# Patient Record
Sex: Male | Born: 1955 | Race: White | Hispanic: No | Smoking: Current every day smoker
Health system: Southern US, Community
[De-identification: ages and names within clinical notes are randomized; demographics above are authoritative.]

## PROBLEM LIST (undated history)

## (undated) DIAGNOSIS — Z7901 Long term (current) use of anticoagulants: Secondary | ICD-10-CM

## (undated) DIAGNOSIS — Z720 Tobacco use: Secondary | ICD-10-CM

## (undated) DIAGNOSIS — J449 Chronic obstructive pulmonary disease, unspecified: Secondary | ICD-10-CM

## (undated) DIAGNOSIS — K838 Other specified diseases of biliary tract: Secondary | ICD-10-CM

## (undated) DIAGNOSIS — K851 Biliary acute pancreatitis without necrosis or infection: Secondary | ICD-10-CM

## (undated) DIAGNOSIS — B1920 Unspecified viral hepatitis C without hepatic coma: Secondary | ICD-10-CM

## (undated) DIAGNOSIS — Z91199 Patient's noncompliance with other medical treatment and regimen due to unspecified reason: Secondary | ICD-10-CM

## (undated) DIAGNOSIS — R9389 Abnormal findings on diagnostic imaging of other specified body structures: Secondary | ICD-10-CM

## (undated) DIAGNOSIS — I10 Essential (primary) hypertension: Secondary | ICD-10-CM

## (undated) DIAGNOSIS — M1990 Unspecified osteoarthritis, unspecified site: Secondary | ICD-10-CM

## (undated) DIAGNOSIS — W57XXXA Bitten or stung by nonvenomous insect and other nonvenomous arthropods, initial encounter: Secondary | ICD-10-CM

## (undated) DIAGNOSIS — Z8719 Personal history of other diseases of the digestive system: Secondary | ICD-10-CM

## (undated) DIAGNOSIS — I82409 Acute embolism and thrombosis of unspecified deep veins of unspecified lower extremity: Secondary | ICD-10-CM

## (undated) DIAGNOSIS — K219 Gastro-esophageal reflux disease without esophagitis: Secondary | ICD-10-CM

## (undated) DIAGNOSIS — I2699 Other pulmonary embolism without acute cor pulmonale: Secondary | ICD-10-CM

## (undated) DIAGNOSIS — Z9119 Patient's noncompliance with other medical treatment and regimen: Secondary | ICD-10-CM

## (undated) HISTORY — DX: Essential (primary) hypertension: I10

## (undated) HISTORY — DX: Patient's noncompliance with other medical treatment and regimen due to unspecified reason: Z91.199

## (undated) HISTORY — PX: INGUINAL HERNIA REPAIR: SUR1180

## (undated) HISTORY — PX: ANKLE SURGERY: SHX546

## (undated) HISTORY — DX: Other pulmonary embolism without acute cor pulmonale: I26.99

## (undated) HISTORY — DX: Long term (current) use of anticoagulants: Z79.01

## (undated) HISTORY — DX: Abnormal findings on diagnostic imaging of other specified body structures: R93.89

## (undated) HISTORY — DX: Acute embolism and thrombosis of unspecified deep veins of unspecified lower extremity: I82.409

## (undated) HISTORY — PX: FOOT SURGERY: SHX648

## (undated) HISTORY — DX: Chronic obstructive pulmonary disease, unspecified: J44.9

## (undated) HISTORY — DX: Unspecified viral hepatitis C without hepatic coma: B19.20

## (undated) HISTORY — DX: Biliary acute pancreatitis without necrosis or infection: K85.10

## (undated) HISTORY — DX: Tobacco use: Z72.0

## (undated) HISTORY — DX: Bitten or stung by nonvenomous insect and other nonvenomous arthropods, initial encounter: W57.XXXA

## (undated) HISTORY — DX: Patient's noncompliance with other medical treatment and regimen: Z91.19

## (undated) HISTORY — DX: Other specified diseases of biliary tract: K83.8

---
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
## 1996-02-21 HISTORY — PX: ANKLE SURGERY: SHX546

## 2011-02-27 ENCOUNTER — Encounter: Payer: Self-pay | Admitting: Cardiology

## 2011-02-28 ENCOUNTER — Encounter: Payer: Self-pay | Admitting: Cardiology

## 2011-03-06 ENCOUNTER — Encounter: Payer: Self-pay | Admitting: Cardiology

## 2011-03-10 ENCOUNTER — Telehealth: Payer: Self-pay

## 2011-03-10 ENCOUNTER — Encounter: Payer: Self-pay | Admitting: Cardiology

## 2011-03-10 LAB — PROTIME-INR

## 2011-03-10 NOTE — Telephone Encounter (Signed)
Records Received from [facility] gave to Gesila,Pt Needs to Make Np appt ? 03/10/11/KM

## 2011-03-13 ENCOUNTER — Telehealth: Payer: Self-pay | Admitting: Pharmacist

## 2011-03-13 NOTE — Telephone Encounter (Signed)
Received INR result from [facility].  Reviewed pt's medical records from [place].  Recently had PE and was started on Coumadin.  Due to establish care with Dr. Jens Som on 1/23.  He had his INR checked on 1/16 and was told to hold Coumadin x 2 days by MD in [place]. He was taking 3mg  daily at that point.  He was instructed to restart Coumadin with 5mg  daily (?) on Firday 1/18 and have INR rechecked and sent to our office.  INR on 1/18 was 1.5.  I asked pt to continue 5mg  daily and we will set him up for the Coumadin clinic on 1/23 when he sees Dr. Jens Som.

## 2011-03-15 ENCOUNTER — Ambulatory Visit (INDEPENDENT_AMBULATORY_CARE_PROVIDER_SITE_OTHER): Payer: BC Managed Care – PPO | Admitting: *Deleted

## 2011-03-15 ENCOUNTER — Encounter: Payer: Self-pay | Admitting: Cardiology

## 2011-03-15 ENCOUNTER — Ambulatory Visit (INDEPENDENT_AMBULATORY_CARE_PROVIDER_SITE_OTHER): Payer: BC Managed Care – PPO | Admitting: Cardiology

## 2011-03-15 VITALS — BP 148/72 | HR 78 | Ht 73.5 in | Wt 186.0 lb

## 2011-03-15 DIAGNOSIS — I2699 Other pulmonary embolism without acute cor pulmonale: Secondary | ICD-10-CM

## 2011-03-15 DIAGNOSIS — Z7901 Long term (current) use of anticoagulants: Secondary | ICD-10-CM | POA: Insufficient documentation

## 2011-03-15 DIAGNOSIS — Z720 Tobacco use: Secondary | ICD-10-CM

## 2011-03-15 DIAGNOSIS — I82409 Acute embolism and thrombosis of unspecified deep veins of unspecified lower extremity: Secondary | ICD-10-CM | POA: Insufficient documentation

## 2011-03-15 DIAGNOSIS — F17200 Nicotine dependence, unspecified, uncomplicated: Secondary | ICD-10-CM

## 2011-03-15 LAB — POCT INR: INR: 1.3

## 2011-03-15 NOTE — Assessment & Plan Note (Signed)
Patient counseled on discontinuing. 

## 2011-03-15 NOTE — Patient Instructions (Signed)
REFERRAL TO PULMONARY FOR PE

## 2011-03-15 NOTE — Assessment & Plan Note (Signed)
Plan continue Coumadin with goal INR 2-3. He will establish in the Coumadin clinic today. 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 He will need Coumadin for at least one year and potentially indefinitely if he continues in his present job. He has no cardiac problems. I will arrange a pulmonary evaluation to follow this long-term.

## 2011-03-15 NOTE — Patient Instructions (Signed)
A full discussion of the nature of anticoagulants has been carried out.  A benefit risk analysis has been presented to the patient, so that they understand the justification for choosing anticoagulation at this time. The need for frequent and regular monitoring, precise dosage adjustment and compliance is stressed.  Side effects of potential bleeding are discussed.  The patient should avoid any OTC items containing aspirin or ibuprofen, and should avoid great swings in general diet.  Avoid alcohol consumption.  Call if any signs of abnormal bleeding.  Next PT/INRin one week.  

## 2011-03-15 NOTE — Progress Notes (Signed)
  HPI: 56 year old male for evaluation of pulmonary embolus. Patient is a long distance truck driver and apparently seen in [place] recently with chest pain and shortness of breath. Left heart catheterization revealed minor plaquing but no obstructive coronary disease. The ejection fraction was 55%. Left ventricular end-diastolic pressure was 9. Patient's d-dimer was elevated and apparently a chest CT showed pulmonary embolus. He has been treated with Coumadin. He now presents to establish care. He does have dyspnea on exertion but denies orthopnea, PND, pedal edema, palpitations, syncope or chest pain.  Current Outpatient Prescriptions  Medication Sig Dispense Refill  . warfarin (COUMADIN) 3 MG tablet Take by mouth. As directed by the coumadin clinic        No Known Allergies  Past Medical History  Diagnosis Date  . Pulmonary embolus     Past Surgical History  Procedure Date  . Foot surgery     History   Social History  . Marital Status: Married    Spouse Name: N/A    Number of Children: N/A  . Years of Education: N/A   Occupational History  .      Long distance truck driver   Social History Main Topics  . Smoking status: Former Smoker -- 1.0 packs/day for 43 years    Types: Cigarettes    Quit date: 02/27/2011  . Smokeless tobacco: Never Used  . Alcohol Use: Yes     Occasional  . Drug Use: Not on file  . Sexually Active: Not on file   Other Topics Concern  . Not on file   Social History Narrative  . No narrative on file    Family History  Problem Relation Age of Onset  . Heart disease Mother     CHF  . Coronary artery disease Sister   . Coronary artery disease Sister     ROS: no fevers or chills, productive cough, hemoptysis, dysphasia, odynophagia, melena, hematochezia, dysuria, hematuria, rash, seizure activity, orthopnea, PND, pedal edema, claudication. Remaining systems are negative.  Physical Exam:   Blood pressure 148/72, pulse 78, height 6' 1.5"  (1.867 m), weight 186 lb (84.369 kg).  General:  Well developed/well nourished in NAD Skin warm/dry Patient not depressed No peripheral clubbing Back-normal HEENT-normal/normal eyelids Neck supple/normal carotid upstroke bilaterally; no bruits; no JVD; no thyromegaly chest - CTA/ normal expansion CV - RRR/normal S1 and S2; no murmurs, rubs or gallops;  PMI nondisplaced Abdomen -NT/ND, no HSM, no mass, + bowel sounds, no bruit 2+ femoral pulses, no bruits Ext-no edema, chords, 2+ DP Neuro-grossly nonfocal

## 2011-03-16 ENCOUNTER — Ambulatory Visit (INDEPENDENT_AMBULATORY_CARE_PROVIDER_SITE_OTHER): Payer: BC Managed Care – PPO | Admitting: Internal Medicine

## 2011-03-16 ENCOUNTER — Ambulatory Visit: Payer: BC Managed Care – PPO | Admitting: Internal Medicine

## 2011-03-16 ENCOUNTER — Encounter: Payer: Self-pay | Admitting: Internal Medicine

## 2011-03-16 VITALS — BP 154/80 | HR 74 | Temp 98.2°F | Ht 73.0 in | Wt 191.4 lb

## 2011-03-16 DIAGNOSIS — I82409 Acute embolism and thrombosis of unspecified deep veins of unspecified lower extremity: Secondary | ICD-10-CM

## 2011-03-16 DIAGNOSIS — I2699 Other pulmonary embolism without acute cor pulmonale: Secondary | ICD-10-CM

## 2011-03-16 NOTE — Patient Instructions (Signed)
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

## 2011-03-16 NOTE — Assessment & Plan Note (Addendum)
-   Onset 02/27/11 with L DVT [facility]   - Echo ordered 03/16/2011 >>>  Presyncope and persistent doe worrisome for [facility] but too early to rec anything but therapeutic coumadin. Will get baseline echo to check RV function and also pft's since was a smoker to follow serial dlco

## 2011-03-16 NOTE — Progress Notes (Signed)
  Subjective:    Patient ID: Justin Suarez, male    DOB: 06/26/1955, 56 y.o.   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   03/16/2011 1st pulmonary eval cc abupt onset 02/27/11  presyncope and sob p days before noting L calf tight and admitted same day to [facility] hosp x 9 days dx with PE/ L DVT > much better at discharge x 75% but still invariably sob across the parking lot, no leg symptoms.  No cough. No bleeding  Sleeping ok without nocturnal  or early am exacerbation  of respiratory  c/o's . Also denies any obvious fluctuation of symptoms with weather or environmental changes or other aggravating or alleviating factors except as outlined above.    Review of Systems  Constitutional: Negative for fever, chills, activity change, appetite change and unexpected weight change.  HENT: Negative for congestion, sore throat, rhinorrhea, sneezing, trouble swallowing, dental problem, voice change and postnasal drip.   Eyes: Negative for visual disturbance.  Respiratory: Positive for shortness of breath. Negative for cough and choking.   Cardiovascular: Negative for chest pain and leg swelling.  Gastrointestinal: Negative for nausea, vomiting and abdominal pain.  Genitourinary: Negative for difficulty urinating.  Musculoskeletal: Negative for arthralgias.  Skin: Negative for rash.  Psychiatric/Behavioral: Negative for behavioral problems and confusion.       Objective:   Physical Exam  Thin amb wm nad Wt 191 03/16/2011  Edentulous with dentures  HEENT mild turbinate edema.  Oropharynx no thrush or excess pnd or cobblestoning.  No JVD or cervical adenopathy. Mild accessory muscle hypertrophy. Trachea midline, nl thryroid. Chest was hyperinflated by percussion with diminished breath sounds and moderate increased exp time without wheeze. Hoover  sign positive at mid inspiration. Regular rate and rhythm without murmur gallop or rub or  Significant increase P2 or edema.  Abd: no hsm, nl excursion. Ext warm without cyanosis or clubbing.         Assessment & Plan:

## 2011-03-16 NOTE — Assessment & Plan Note (Signed)
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

## 2011-03-22 ENCOUNTER — Ambulatory Visit (INDEPENDENT_AMBULATORY_CARE_PROVIDER_SITE_OTHER): Payer: BC Managed Care – PPO | Admitting: *Deleted

## 2011-03-22 ENCOUNTER — Ambulatory Visit (HOSPITAL_COMMUNITY): Payer: BC Managed Care – PPO | Attending: Internal Medicine | Admitting: Radiology

## 2011-03-22 DIAGNOSIS — Z7901 Long term (current) use of anticoagulants: Secondary | ICD-10-CM

## 2011-03-22 DIAGNOSIS — I82409 Acute embolism and thrombosis of unspecified deep veins of unspecified lower extremity: Secondary | ICD-10-CM

## 2011-03-22 DIAGNOSIS — I2699 Other pulmonary embolism without acute cor pulmonale: Secondary | ICD-10-CM | POA: Insufficient documentation

## 2011-03-22 MED ORDER — WARFARIN SODIUM 6 MG PO TABS: ORAL_TABLET | ORAL | Status: DC

## 2011-03-23 ENCOUNTER — Encounter: Payer: Self-pay | Admitting: Internal Medicine

## 2011-03-23 NOTE — Progress Notes (Signed)
Quick Note:  Spoke with pt and notified of results per Dr. Wert. Pt verbalized understanding and denied any questions.  ______ 

## 2011-03-29 ENCOUNTER — Encounter: Payer: BC Managed Care – PPO | Admitting: *Deleted

## 2011-03-31 ENCOUNTER — Ambulatory Visit (INDEPENDENT_AMBULATORY_CARE_PROVIDER_SITE_OTHER): Payer: BC Managed Care – PPO | Admitting: *Deleted

## 2011-03-31 DIAGNOSIS — Z7901 Long term (current) use of anticoagulants: Secondary | ICD-10-CM

## 2011-03-31 DIAGNOSIS — I2699 Other pulmonary embolism without acute cor pulmonale: Secondary | ICD-10-CM

## 2011-03-31 DIAGNOSIS — I82409 Acute embolism and thrombosis of unspecified deep veins of unspecified lower extremity: Secondary | ICD-10-CM

## 2011-03-31 LAB — POCT INR: INR: 2.3

## 2011-04-07 ENCOUNTER — Ambulatory Visit (INDEPENDENT_AMBULATORY_CARE_PROVIDER_SITE_OTHER): Payer: BC Managed Care – PPO | Admitting: *Deleted

## 2011-04-07 ENCOUNTER — Telehealth: Payer: Self-pay | Admitting: Cardiology

## 2011-04-07 DIAGNOSIS — I82409 Acute embolism and thrombosis of unspecified deep veins of unspecified lower extremity: Secondary | ICD-10-CM

## 2011-04-07 DIAGNOSIS — Z7901 Long term (current) use of anticoagulants: Secondary | ICD-10-CM

## 2011-04-07 DIAGNOSIS — R0989 Other specified symptoms and signs involving the circulatory and respiratory systems: Secondary | ICD-10-CM

## 2011-04-07 DIAGNOSIS — I2699 Other pulmonary embolism without acute cor pulmonale: Secondary | ICD-10-CM

## 2011-04-07 LAB — POCT INR: INR: 6.3

## 2011-04-07 LAB — PROTIME-INR: Prothrombin Time: 52.1 seconds — ABNORMAL HIGH (ref 11.6–15.2)

## 2011-04-07 NOTE — Telephone Encounter (Signed)
Received a call from [facility] to report a critical lab value of INR 5.58. Patient is on coumadin for recent diagnosis of PE, with INR goal 2-3. He was at the coumadin clinic this morning and had PT/INR drawn. I called the patient and he denied any signs of active bleeding. He already took his 6mg  dose of coumadin this morning. After discussions with Theodore Demark, PA-C, I instructed him to hold his coumadin dose tomorrow (Saturday), take 3mg  on Sunday and Monday and await a phone call from the coumadin clinic for further instructions. I also instructed him to go to the ER should he develop active bleeding. He stated understanding and was agreeable. I then called the coumadin clinic and left a message.  Jeffry Vogelsang  PA-C 04/07/2011   9:08 PM

## 2011-04-07 NOTE — Telephone Encounter (Signed)
Opened in error

## 2011-04-13 ENCOUNTER — Ambulatory Visit (INDEPENDENT_AMBULATORY_CARE_PROVIDER_SITE_OTHER): Payer: BC Managed Care – PPO | Admitting: Internal Medicine

## 2011-04-13 ENCOUNTER — Encounter: Payer: Self-pay | Admitting: Internal Medicine

## 2011-04-13 ENCOUNTER — Ambulatory Visit (INDEPENDENT_AMBULATORY_CARE_PROVIDER_SITE_OTHER): Payer: BC Managed Care – PPO | Admitting: *Deleted

## 2011-04-13 VITALS — BP 120/78 | HR 78 | Temp 98.3°F | Ht 73.0 in | Wt 188.0 lb

## 2011-04-13 DIAGNOSIS — Z7901 Long term (current) use of anticoagulants: Secondary | ICD-10-CM

## 2011-04-13 DIAGNOSIS — I2699 Other pulmonary embolism without acute cor pulmonale: Secondary | ICD-10-CM

## 2011-04-13 DIAGNOSIS — J449 Chronic obstructive pulmonary disease, unspecified: Secondary | ICD-10-CM | POA: Insufficient documentation

## 2011-04-13 DIAGNOSIS — I82409 Acute embolism and thrombosis of unspecified deep veins of unspecified lower extremity: Secondary | ICD-10-CM

## 2011-04-13 LAB — PULMONARY FUNCTION TEST

## 2011-04-13 LAB — POCT INR: INR: 2.2

## 2011-04-13 NOTE — Patient Instructions (Addendum)
If your leg doppler is normal at 6 months (July 2013 ) I recommend you stop the coumadin and take aspirin daily thereafter.  To be complete, you a need hypercoagulability profile once you stop the coumadin.  You don't have significant copd by your lung function tests today and never will unless you resume smoking

## 2011-04-13 NOTE — Assessment & Plan Note (Signed)
-   04/13/2011  PFT's  2.89 (79%) with ratio 68 no better p B2,  DLCO 70%  GOLD II (barely) I reviewed the Flethcher curve with patient that basically indicates  if you quit smoking when your best day FEV1 is still well preserved it is highly unlikely you will progress to severe disease and informed the patient there was no medication on the market that has proven to change the curve or the likelihood of progression.  Therefore stopping smoking and maintaining abstinence is the most important aspect of care, not choice of inhalers or for that matter, doctors.

## 2011-04-13 NOTE — Progress Notes (Signed)
PFT done today. 

## 2011-04-13 NOTE — Progress Notes (Signed)
  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   03/16/2011 1st pulmonary eval cc abupt onset 02/27/11  presyncope and sob p days before noting L calf tight and admitted same day to [facility] hosp x 9 days dx with PE/ L DVT > much better at discharge x 75% but still invariably sob across the parking lot, no leg symptoms.  No cough. No bleeding rec Please see patient coordinator before you leave today  to schedule echocardiogram to be sure your right heart function will allow you to drive cross country   05/29/8117 f/u ov/Thelda Gagan cc completely back to baseline activity tol (no aerobics) with no sob, no cough  Sleeping ok without nocturnal  or early am exacerbation  of respiratory  c/o's . Also denies any obvious fluctuation of symptoms with weather or environmental changes or other aggravating or alleviating factors except as outlined above.  ROS  At present neg for  any significant sore throat, dysphagia, itching, sneezing,  nasal congestion or excess/ purulent secretions,  fever, chills, sweats, unintended wt loss, pleuritic or exertional cp, hempoptysis, orthopnea pnd or leg swelling.  Also denies presyncope, palpitations, heartburn, abdominal pain, nausea, vomiting, diarrhea  or change in bowel or urinary habits, dysuria,hematuria,  rash, arthralgias, visual complaints, headache, numbness weakness or ataxia.              Objective:   Physical Exam  Thin amb wm nad Wt 191 03/16/2011 > 04/13/2011 f/u ov/Javon Hupfer cc  Edentulous with dentures  HEENT mild turbinate edema.  Oropharynx no thrush or excess pnd or cobblestoning.  No JVD or cervical adenopathy. Mild accessory muscle hypertrophy. Trachea midline, nl thryroid. Chest was hyperinflated by percussion with diminished breath  sounds and moderate increased exp time without wheeze. Hoover sign positive at mid inspiration. Regular rate and rhythm without murmur gallop or rub or  Significant increase P2 or edema.  Abd: no hsm, nl excursion. Ext warm without cyanosis or clubbing.         Assessment & Plan:

## 2011-04-15 NOTE — Assessment & Plan Note (Signed)
-   Onset 02/27/11 with L DVT [facility]   - Echo 03/22/11 >  Normal LV size and systolic function, EF 55-60%. Moderate diastolic dysfunction. The RV appears normal in size and systolic function. No significant valvular abnormality.  No evidence of sign PAH or diffusion abnormatlity per pft's done today.  Ok to consider d/c coumadin if venous dopplers nl in 6 months but off coumadin x 2 weeks need to have hypercoagulability profile run, take aspirin and promise to change his work / driving habits.

## 2011-04-18 ENCOUNTER — Encounter: Payer: Self-pay | Admitting: Internal Medicine

## 2011-04-21 ENCOUNTER — Ambulatory Visit (INDEPENDENT_AMBULATORY_CARE_PROVIDER_SITE_OTHER): Payer: BC Managed Care – PPO | Admitting: *Deleted

## 2011-04-21 DIAGNOSIS — I2699 Other pulmonary embolism without acute cor pulmonale: Secondary | ICD-10-CM

## 2011-04-21 DIAGNOSIS — I82409 Acute embolism and thrombosis of unspecified deep veins of unspecified lower extremity: Secondary | ICD-10-CM

## 2011-04-21 DIAGNOSIS — Z7901 Long term (current) use of anticoagulants: Secondary | ICD-10-CM

## 2011-05-05 ENCOUNTER — Ambulatory Visit (INDEPENDENT_AMBULATORY_CARE_PROVIDER_SITE_OTHER): Payer: BC Managed Care – PPO | Admitting: Pharmacist

## 2011-05-05 DIAGNOSIS — Z7901 Long term (current) use of anticoagulants: Secondary | ICD-10-CM

## 2011-05-05 DIAGNOSIS — I2699 Other pulmonary embolism without acute cor pulmonale: Secondary | ICD-10-CM

## 2011-05-05 DIAGNOSIS — I82409 Acute embolism and thrombosis of unspecified deep veins of unspecified lower extremity: Secondary | ICD-10-CM

## 2011-05-05 LAB — POCT INR: INR: 1.4

## 2011-05-22 ENCOUNTER — Ambulatory Visit (INDEPENDENT_AMBULATORY_CARE_PROVIDER_SITE_OTHER): Payer: BC Managed Care – PPO | Admitting: *Deleted

## 2011-05-22 DIAGNOSIS — I2699 Other pulmonary embolism without acute cor pulmonale: Secondary | ICD-10-CM

## 2011-05-22 DIAGNOSIS — I82409 Acute embolism and thrombosis of unspecified deep veins of unspecified lower extremity: Secondary | ICD-10-CM

## 2011-05-22 DIAGNOSIS — Z7901 Long term (current) use of anticoagulants: Secondary | ICD-10-CM

## 2011-05-22 LAB — POCT INR: INR: 2.2

## 2011-06-19 ENCOUNTER — Ambulatory Visit (INDEPENDENT_AMBULATORY_CARE_PROVIDER_SITE_OTHER): Payer: BC Managed Care – PPO | Admitting: *Deleted

## 2011-06-19 DIAGNOSIS — I2699 Other pulmonary embolism without acute cor pulmonale: Secondary | ICD-10-CM

## 2011-06-19 DIAGNOSIS — I82409 Acute embolism and thrombosis of unspecified deep veins of unspecified lower extremity: Secondary | ICD-10-CM

## 2011-06-19 DIAGNOSIS — Z7901 Long term (current) use of anticoagulants: Secondary | ICD-10-CM

## 2011-06-21 DIAGNOSIS — R9389 Abnormal findings on diagnostic imaging of other specified body structures: Secondary | ICD-10-CM

## 2011-06-21 HISTORY — DX: Abnormal findings on diagnostic imaging of other specified body structures: R93.89

## 2011-07-19 ENCOUNTER — Emergency Department (HOSPITAL_COMMUNITY)
Admission: EM | Admit: 2011-07-19 | Discharge: 2011-07-19 | Disposition: A | Discharge status: Home / Self Care | Payer: BC Managed Care – PPO | Attending: Emergency Medicine | Admitting: Emergency Medicine

## 2011-07-19 ENCOUNTER — Encounter (HOSPITAL_COMMUNITY): Payer: Self-pay

## 2011-07-19 ENCOUNTER — Emergency Department (HOSPITAL_COMMUNITY): Payer: BC Managed Care – PPO

## 2011-07-19 DIAGNOSIS — R072 Precordial pain: Secondary | ICD-10-CM | POA: Insufficient documentation

## 2011-07-19 DIAGNOSIS — R079 Chest pain, unspecified: Secondary | ICD-10-CM

## 2011-07-19 DIAGNOSIS — R209 Unspecified disturbances of skin sensation: Secondary | ICD-10-CM | POA: Insufficient documentation

## 2011-07-19 DIAGNOSIS — R112 Nausea with vomiting, unspecified: Secondary | ICD-10-CM | POA: Insufficient documentation

## 2011-07-19 DIAGNOSIS — R200 Anesthesia of skin: Secondary | ICD-10-CM

## 2011-07-19 DIAGNOSIS — R61 Generalized hyperhidrosis: Secondary | ICD-10-CM | POA: Insufficient documentation

## 2011-07-19 DIAGNOSIS — Z86711 Personal history of pulmonary embolism: Secondary | ICD-10-CM | POA: Insufficient documentation

## 2011-07-19 DIAGNOSIS — R42 Dizziness and giddiness: Secondary | ICD-10-CM | POA: Insufficient documentation

## 2011-07-19 DIAGNOSIS — F17200 Nicotine dependence, unspecified, uncomplicated: Secondary | ICD-10-CM | POA: Insufficient documentation

## 2011-07-19 LAB — BASIC METABOLIC PANEL
Chloride: 97 mEq/L (ref 96–112)
GFR calc Af Amer: >90 mL/min (ref >90)
GFR calc non Af Amer: >90 mL/min (ref >90)
Potassium: 3.7 mEq/L (ref 3.5–5.1)
Sodium: 135 mEq/L (ref 135–145)

## 2011-07-19 LAB — PROTIME-INR: Prothrombin Time: 12.6 seconds (ref 11.6–15.2)

## 2011-07-19 LAB — DIFFERENTIAL
Basophils Absolute: 0 10*3/uL (ref 0.0–0.1)
Basophils Relative: 0 % (ref 0–1)
Eosinophils Absolute: 0 10*3/uL (ref 0.0–0.7)
Neutro Abs: 4.2 10*3/uL (ref 1.7–7.7)
Neutrophils Relative %: 70 % (ref 43–77)

## 2011-07-19 LAB — CBC
MCH: 33 pg (ref 26.0–34.0)
MCHC: 34.6 g/dL (ref 30.0–36.0)
Platelets: 250 10*3/uL (ref 150–400)

## 2011-07-19 LAB — TROPONIN I: Troponin I: <0.3 ng/mL (ref <0.30)

## 2011-07-19 LAB — D-DIMER, QUANTITATIVE: D-Dimer, Quant: 0.56 ug/mL-FEU — ABNORMAL HIGH (ref 0.00–0.48)

## 2011-07-19 MED ORDER — ASPIRIN 81 MG PO CHEW
324.0000 mg | CHEWABLE_TABLET | Freq: Once | ORAL | Status: AC
  Administered 2011-07-19: 324 mg via ORAL
  Filled 2011-07-19: qty 4

## 2011-07-19 MED ORDER — IOHEXOL 300 MG/ML  SOLN
100.0000 mL | Freq: Once | INTRAMUSCULAR | Status: AC | PRN
  Administered 2011-07-19: 100 mL via INTRAVENOUS

## 2011-07-19 MED ORDER — ONDANSETRON HCL 4 MG/2ML IJ SOLN
4.0000 mg | Freq: Once | INTRAMUSCULAR | Status: AC
  Administered 2011-07-19: 4 mg via INTRAVENOUS
  Filled 2011-07-19: qty 2

## 2011-07-19 MED ORDER — IOHEXOL 300 MG/ML  SOLN: 100.0000 mL | Freq: Once | INTRAMUSCULAR | Status: DC | PRN

## 2011-07-19 NOTE — ED Provider Notes (Signed)
History     CSN: [id number]  Arrival date & time 07/19/11  4098   First MD Initiated Contact with Patient 07/19/11 1004      Chief Complaint  Patient presents with  . Chest Pain  . 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  Pain was associated with lightheadedness, numbness of his left arm, and diaphoresis.  He reports that he took two aspirin and then fell asleep.  He woke up 5 hours later and no longer had the chest pain.  However, when he woke up he was feeling nauseous and vomited once last evening.  He also vomited again this morning.  At this time he is feeling nauseous, having chills, and continues to have numbness of the left arm.  He denies any weakness.  He denies any chest pain or SOB at this time.  He has a PMH significant for PE and COPD. PE diagnosed in [place] in January 2013.   He was on daily coumadin, but stopped 10 days ago because he was having a bleeding hemorrhoid.  His Cardiologist is Dr. Jens Som and he reports that Dr. Jens Som is aware that he has stopped the Coumadin.  He also reports that in January he had a cardiac cath done in [place] that he reports was negative.  He also had an Echocardiogram done on 03/22/11, which showed an EF of 55%.    The history is provided by the patient.    Past Medical History  Diagnosis Date  . Pulmonary embolus   . Embolism - blood clot     Past Surgical History  Procedure Date  . Foot surgery   . Ankle surgery     Family History  Problem Relation Age of Onset  . Heart disease Mother     CHF  . Coronary artery disease Sister   . Coronary artery disease Sister     History  Substance Use Topics  . Smoking status: Current Everyday Smoker -- 1.0 packs/day for 43 years    Types: Cigars    Last Attempt to Quit: 02/27/2011  . Smokeless tobacco: Never Used    . Alcohol Use: Yes     Occasional. 4-5 beers 12 oz./week.       Review of Systems  Constitutional: Negative for fever and chills.  HENT: Negative for neck pain and neck stiffness.   Eyes: Negative for visual disturbance.  Respiratory: Negative for cough, shortness of breath and wheezing.   Cardiovascular: Positive for chest pain. Negative for palpitations and leg swelling.  Gastrointestinal: Positive for nausea and vomiting. Negative for abdominal pain.  Musculoskeletal: Negative for gait problem.  Skin: Negative for color change and rash.  Neurological: Positive for dizziness, light-headedness and numbness. Negative for syncope, weakness and headaches.  Psychiatric/Behavioral: Negative for confusion.    Allergies  Review of patient's allergies indicates no known allergies.  Home Medications  No current outpatient prescriptions on file.  BP 161/84  Pulse 65  Temp(Src) 98.4 F (36.9 C) (Oral)  Resp 21  Wt 190 lb 2 oz (86.24 kg)  SpO2 97%  Physical Exam  Nursing note and vitals reviewed. Constitutional: He appears well-developed and well-nourished. No distress.  HENT:  Head: Normocephalic and atraumatic.  Mouth/Throat: Oropharynx is clear and moist.  Neck: Normal range of motion. Neck supple.  Cardiovascular: Normal rate, regular rhythm and normal heart sounds.   Pulmonary/Chest: Effort normal and breath sounds  normal. No respiratory distress. He has no wheezes. He has no rales. He exhibits no tenderness.  Neurological: He is alert. He has normal strength. No cranial nerve deficit or sensory deficit.  Skin: Skin is warm and dry. He is not diaphoretic.  Psychiatric: He has a normal mood and affect.    ED Course  Procedures (including critical care time)  Labs Reviewed  BASIC METABOLIC PANEL - Abnormal; Notable for the following:    Glucose, Bld 234 (*)    All other components within normal limits  D-DIMER, QUANTITATIVE - Abnormal; Notable for the following:     D-Dimer, Quant 0.56 (*)    All other components within normal limits  CBC  DIFFERENTIAL  TROPONIN I  PROTIME-INR  TROPONIN I  LAB REPORT - SCANNED   Dg Chest 2 View  07/19/2011  *RADIOLOGY REPORT*  Clinical Data: Left-sided chest pain with left arm numbness.  CHEST - 2 VIEW  Comparison: None.  Findings: Trachea is midline.  Heart size normal.  Lungs are hyperinflated but clear.  No pleural fluid.  IMPRESSION: No acute findings.  Original Report Authenticated By: Reyes Ivan, M.D.   Ct Angio Chest W/cm &/or Wo Cm  07/19/2011  *RADIOLOGY REPORT*  Clinical Data: Chest pain, weakness, diaphoresis and elevated D- dimer.  History of pulmonary emboli.  Question PE.  CT ANGIOGRAPHY CHEST  Technique:  Multidetector CT imaging of the chest using the standard protocol during bolus administration of intravenous contrast. Multiplanar reconstructed images including MIPs were obtained and reviewed to evaluate the vascular anatomy.  Contrast: OMNIPAQUE IOHEXOL 300 MG/ML  SOLN  Comparison: Chest radiograph 07/19/2011.  Findings: No pulmonary embolus.  Mediastinal and hilar lymph nodes are not enlarged by CT size criteria.  No axillary adenopathy. Bilateral gynecomastia.  Heart size normal.  Coronary artery calcification appears age advanced.  No pericardial effusion.  Scattered small pulmonary nodules measure up to 8 mm (10 x 6 mm) on image 72 in the left lower lobe.  Minimal dependent atelectasis bilaterally.  Mild expiratory phase imaging creates added density in the lungs bilaterally.  No pleural fluid.  Airway is otherwise unremarkable.  Incidental imaging of the upper abdomen shows no acute findings. No worrisome lytic or sclerotic lesions.  IMPRESSION:  1.  No pulmonary embolus. 2.  Coronary artery calcification, age advanced. 3.  Scattered pulmonary nodules, measuring up to 8 mm. If the patient is at high risk for bronchogenic carcinoma, follow-up chest CT at 3-6 months is recommended.  If the patient  is at low risk for bronchogenic carcinoma, follow-up chest CT at 6-12 months is recommended.  This recommendation follows the consensus statement: Guidelines for Management of Small Pulmonary Nodules Detected on CT Scans: A Statement from the Fleischner Society as published in Radiology 2005; 237:395-400.  Original Report Authenticated By: Reyes Ivan, M.D.     1. Chest pain   2. Numbness and tingling in left arm      Date: 07/19/2011  Rate: 72  Rhythm: normal sinus rhythm  QRS Axis: normal  Intervals: normal  ST/T Wave abnormalities: normal  Conduction Disutrbances:none  Narrative Interpretation:   Old EKG Reviewed: unchanged  Patient discussed with Dr. Ranae Palms who also evaluated patient.    2:38 PM Discussed with Annabelle Harman from [facility].  She reports that someone will come by and see patient.  3:30 PM Discussed with Cardiology who has evaluated patient.  They recommend ordering another set of cardiac markers.  If negative, they feel that patient  can be discharged home from a Cardiology standpoint.    MDM  Patient with PMH significant for PE and COPD comes in today with a chief complaint of chest pain and left arm numbness.  No acute changes on EKG.  Troponin negative x 2.  CTA ordered due to past history of PE. CTA negative for PE.  CTA did show pulmonary nodules.   Results and recommendations discussed with patient.  Patient evaluated by Cardiology who did not feel that the symptoms were cardiac.  Patient could have a pinched nerve causing the symptoms.  Patient instructed to follow up with PCP.  Return precautions discussed.        Pascal Lux [place], PA-C 07/20/11 [phone]

## 2011-07-19 NOTE — ED Notes (Signed)
Cardiology MD at bedside.

## 2011-07-19 NOTE — ED Notes (Signed)
Pt c/o returned chest pain to EDPA. Repeat EKG performed and given to Herbert Seta, EDPA and Ranae Palms, EDP.

## 2011-07-19 NOTE — Discharge Instructions (Signed)
* No food or drink after midnight 07/25/11 night in preparation for your stress test on 07/26/11.  * No caffeine on 07/26/11 in preparation for your stress test on 07/26/11.  * Wear comfortable shoes on 07/26/11 as you will be walking on a treadmill    Read instructions below for reasons to return to the Emergency Department. It is recommended that your follow up with your Primary Care Doctor in regards to today's visit. If you do not have a doctor, use the resource guide listed below to help you find one. Begin taking over the counter Prilosec or Zegrid as directed.   Chest Pain (Nonspecific)  HOME CARE INSTRUCTIONS  For the next few days, avoid physical activities that bring on chest pain. Continue physical activities as directed.  Do not smoke cigarettes or drink alcohol until your symptoms are gone.  Only take over-the-counter or prescription medicine for pain, discomfort, or fever as directed by your caregiver.  Follow your caregiver's suggestions for further testing if your chest pain does not go away.  Keep any follow-up appointments you made. If you do not go to an appointment, you could develop lasting (chronic) problems with pain. If there is any problem keeping an appointment, you must call to reschedule.  SEEK MEDICAL CARE IF:  You think you are having problems from the medicine you are taking. Read your medicine instructions carefully.  Your chest pain does not go away, even after treatment.  You develop a rash with blisters on your chest.  SEEK IMMEDIATE MEDICAL CARE IF:  You have increased chest pain or pain that spreads to your arm, neck, jaw, back, or belly (abdomen).  You develop shortness of breath, an increasing cough, or you are coughing up blood.  You have severe back or abdominal pain, feel sick to your stomach (nauseous) or throw up (vomit).  You develop severe weakness, fainting, or chills.  You have an oral temperature above 102 F (38.9 C), not controlled by  medicine.   THIS IS AN EMERGENCY. Do not wait to see if the pain will go away. Get medical help at once. Call your local emergency services (911 in [place] Do not drive yourself to the hospital.   RESOURCE GUIDE  Dental Problems  Patients with Medicaid: [facility] [phone] [address]                                           [phone] [address] Phone:  [phone]                                                  Phone:  [phone]  If unable to pay or uninsured, contact:  [facility] or [facility] to become qualified for the adult dental clinic.  Chronic Pain Problems Contact [facility]  [phone] Patients need to be referred by their primary care doctor.  Insufficient Money for Medicine Contact [employer] Way:  call "211" or [facility] 9365229462.  No Primary Care Doctor Call [facility]  [phone] Other agencies that provide inexpensive medical care  [facility]  [phone]    [facility]  [phone]    [facility]  [phone]    [facility]  [phone]    [facility]  [phone]    [facility]  [phone]  [facility]  [phone] [facility]  [phone] [facility] Mental Health   [phone] (emergency services ([phone])  Substance Abuse Resources [facility] Services  2497135970 [facility] [phone] [facility] [phone] Floydene Flock [phone] Residential & Outpatient Substance Abuse Program  [phone]  Abuse/Neglect [facility] Child Abuse Hotline 7037710541 [facility] Child Abuse Hotline [phone] (After Hours)  Emergency [facility]ter [facility] Minist[facility] 340-104-7730  Maternity Homes [facility] at [facility] [place] [facility] [place] [phone] [facility] [phone]  MRSA Hotline #:    930-468-5861    [facility] Resources  [facility]     [employer]                          [facility] Dept. [address] [place]                       [address]      [address] [place] [address]  Blondell Reveal Phone:  [phone]                                   Phone:  [phone]                 Phone:  [phone]  [facility] Phone:  [phone]  [facility] Child Abuse Hotline [phone] (After Hours)

## 2011-07-19 NOTE — ED Notes (Signed)
Patient transported to X-ray 

## 2011-07-19 NOTE — ED Notes (Signed)
Pt signed authorization for release of medical information to obtain records from [facility] for cardiac cath performed Jan 2013. Authorization and request for records faxed to hospital. Signed authorization placed in pt's chart. Awaiting cardiology consult.

## 2011-07-19 NOTE — Consult Note (Signed)
Cardiology Consult Note   Patient ID: Justin Suarez MRN: [id number], DOB/AGE: 1955/04/28   Admit date: 07/19/2011 Date of Consult: 07/19/2011  Primary Physician: No primary provider on file. Primary Cardiologist: Olga Millers, MD  Pt. 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   Reason for consult: evaluation/management of chest pain  Problem List: Past Medical History  Diagnosis Date  . Pulmonary embolus   . Embolism - blood clot     Past Surgical History  Procedure Date  . Foot surgery   . Ankle surgery      Allergies: No Known Allergies  PAST CARDIAC HISTORY  Cardiac cath 01/13: diffuse plaquing, nonobstructive CAD, LVEF 55%, LVEDP 9  2D echo 01/13: LVEF 55-60%, moderate diastolic dysfunction (grade 2), no WMAs, mild LA dilatation, normal RV systolic function, unable to estimate PASP due to lack of TR doppler jet, IVC WNL  HPI:   The patient recently saw Dr. Jens Som to establish care in 01/13. 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 He was admitted to [facility]. Upon reviewing scanned documents and his prior office note, he had a borderline troponin level and significant cardiac risk factors which prompting cardiac catheterization. He underwent cardiac catheterization which per Dr. Ludwig Clarks assessment revealed minor plaquing, but no obstructive CAD. LVEF 55%. LVEDP of 9. D-dimer was found to be elevated. He was diagnosed with L DVT. CT angiogram of the chest revealed PE. He was started on Coumadin in [place], and established at the [facility]. It was noted that no further cardiac eval was warranted. Since initiating Coumadin, he has had some issues  with supratherapeutic INRs, and self-holding Coumadin due to bleeding hemorrhoids as outlined in previous notes. He followed up with Dr. Sherene Sires in pulmonology 01/13 as well. 2D echocardiogram was ordered to assess for PAH and RV function. PFTs were ordered given patient's prior tobacco use. There was no evidence of PAH. There was a mild degree of COPD noted.   Early yesterday morning, he was driving his truck to [place]. He experienced lightheadedness and pulled over to the side of the road. This was followed by sharp, "stabbing" substernal chest pain which radiated to his L arm with associated numbness. He proceeded to take two full-dose ASA, laid his head back and fell asleep. When he awoke, the pain was gone, but the arm numbness continued in his left 4th and 5th digits. He reports associated nausea and vomiting. He experienced lightheadedness again this morning, numbness and vomiting, prompting his admission to [facility] ED. At baseline, he is able to chop wood without incident. He does endorse subjective fever and chills. No diarrhea. No syncope, palpitations, orthopnea, PND, LE edema. No sick contacts. No other medications. He has never been told he has high cholesterol or high blood pressure. He does have a significant family history of CAD and tobacco abuse. Of note, he stopped Coumadin 10 days ago due to hemorrhoidal bleeding.   Upon ED arrival, D-dimer was elevated at 0.56. INR subtherapeutic at 0.92. CT-angio chest without PE, advanced coronary artery calcification, scattered pulmonary nodules measuring up to 8 mm. Follow-up CT recommended in 3-12 months based on risk. EKG revealed nonspecific ST elevation V3, V3 (~1 mm), follow-up  EKG at <1 mm. CXR unremarkable. BMET and CBC WNL. POC troponin-I WNL. Glucose elevated at 234.   Home Medications: Prior to Admission medications   Not on File    Inpatient Medications:     . aspirin  324 mg Oral Once  . ondansetron  4 mg Intravenous Once    (Not  in a hospital admission)  Family History  Problem Relation Age of Onset  . Heart disease Mother     CHF  . Coronary artery disease Sister   . Coronary artery disease Sister      History   Social History  . Marital Status: Legally Separated    Spouse Name: N/A    Number of Children: 0  . Years of Education: N/A   Occupational History  .      Long distance truck driver   Social History Main Topics  . Smoking status: Current Everyday Smoker -- 1.0 packs/day for 43 years    Types: Cigars    Last Attempt to Quit: 02/27/2011  . Smokeless tobacco: Never Used  . Alcohol Use: Yes     Occasional. 4-5 beers 12 oz./week.   . Drug Use: No  . Sexually Active: Not on file   Other Topics Concern  . Not on file   Social History Narrative   He works as a Naval architect. Lives in [place], [place].      Review of Systems: General: positive for chills, fever, negative for night sweats or weight changes.  Cardiovascular: positive for chest pain, lightheadedness, negative for dyspnea on exertion, edema, orthopnea, palpitations, paroxysmal nocturnal dyspnea or shortness of breath Dermatological:  negative for rash Respiratory: negative for cough or wheezing Urologic: negative for hematuria Abdominal: positive for nausea, vomiting, negative for diarrhea, bright red blood per rectum, melena, or hematemesis Neurologic: negative for visual changes, syncope, or dizziness All other systems reviewed and are otherwise negative except as noted above.  Physical Exam: Blood pressure 167/90, pulse 107, temperature 98.4 F (36.9 C), temperature source Oral, resp. rate 20, weight 86.24 kg (190 lb 2 oz), SpO2 98.00%.    General: Well developed, well nourished, in no acute distress. Head: Normocephalic, atraumatic, sclera non-icteric, no xanthomas, nares are without discharge.  Neck: Negative for carotid bruits. JVD not elevated. Lungs: Clear bilaterally to auscultation without wheezes, rales, or rhonchi.  Breathing is unlabored. Heart: RRR with S1 S2. No murmurs, rubs, or gallops appreciated. Abdomen: Soft, non-tender, non-distended with normoactive bowel sounds. No hepatomegaly. No rebound/guarding. No obvious abdominal masses. Msk:  Strength and tone appears normal for age Extremities: No clubbing, cyanosis or edema.  Distal pedal pulses are 2+ and equal bilaterally. Neuro: Alert and oriented X 3. Moves all extremities spontaneously..weakness in thumb/5th finger ring Left hand Psych:  Responds to questions appropriately with a normal affect. Skin warm and dry Back with out kyphosis or scolisosis  Labs: Recent Labs  [facility] 07/19/11 1015   WBC 5.9   HGB 15.1   HCT 43.6   MCV 95.2   PLT 250   Recent Labs  Basename 07/19/11 1015   DDIMER 0.56*    Lab 07/19/11 1015  NA 135  K 3.7  CL 97  CO2 24  BUN 8  CREATININE 0.76  CALCIUM 8.7  PROT --  BILITOT --  ALKPHOS --  ALT --  AST --  AMYLASE --  LIPASE --  GLUCOSE 234*   Recent Labs  Basename 07/19/11 1015   CKTOTAL --   CKMB --  CKMBINDEX --   TROPONINI <0.30   Radiology/Studies: Dg Chest 2 View  07/19/2011  *RADIOLOGY REPORT*  Clinical Data: Left-sided chest pain with left arm numbness.  CHEST - 2 VIEW  Comparison: None.  Findings: Trachea is midline.  Heart size normal.  Lungs are hyperinflated but clear.  No pleural fluid.  IMPRESSION: No acute findings.  Original Report Authenticated By: Reyes Ivan, M.D.   Ct Angio Chest W/cm &/or Wo Cm  07/19/2011  *RADIOLOGY REPORT*  Clinical Data: Chest pain, weakness, diaphoresis and elevated D- dimer.  History of pulmonary emboli.  Question PE.  CT ANGIOGRAPHY CHEST  Technique:  Multidetector CT imaging of the chest using the standard protocol during bolus administration of intravenous contrast. Multiplanar reconstructed images including MIPs were obtained and reviewed to evaluate the vascular anatomy.  Contrast: OMNIPAQUE IOHEXOL 300 MG/ML  SOLN  Comparison:  Chest radiograph 07/19/2011.  Findings: No pulmonary embolus.  Mediastinal and hilar lymph nodes are not enlarged by CT size criteria.  No axillary adenopathy. Bilateral gynecomastia.  Heart size normal.  Coronary artery calcification appears age advanced.  No pericardial effusion.  Scattered small pulmonary nodules measure up to 8 mm (10 x 6 mm) on image 72 in the left lower lobe.  Minimal dependent atelectasis bilaterally.  Mild expiratory phase imaging creates added density in the lungs bilaterally.  No pleural fluid.  Airway is otherwise unremarkable.  Incidental imaging of the upper abdomen shows no acute findings. No worrisome lytic or sclerotic lesions.  IMPRESSION:  1.  No pulmonary embolus. 2.  Coronary artery calcification, age advanced. 3.  Scattered pulmonary nodules, measuring up to 8 mm. If the patient is at high risk for bronchogenic carcinoma, follow-up chest CT at 3-6 months is recommended.  If the patient is at low risk for bronchogenic carcinoma, follow-up chest CT at 6-12 months is recommended.  This recommendation follows the consensus statement: Guidelines for Management of Small Pulmonary Nodules Detected on CT Scans: A Statement from the Fleischner Society as published in Radiology 2005; 237:395-400.  Original Report Authenticated By: Reyes Ivan, M.D.    EKG: NSR, 77 bpm, nonspecific ST changes (elevation ~ 1mm V2, V3) NSR,72 bpm, nonspecific ST changes (elevation <1 mm V2, V3)  ASSESSMENT AND PLAN:   1. Chest pain- atypical for cardiac source. He describes the pain as sharp, lasting a few minutes, and nonexertional. He can perform strenuous outdoor chores without experiencing chest pain or shortness of breath at baseline. Cardiac cath in January 2013 revealed nonobstructive CAD. EKG and cardiac markers today unremarkable. He does have significant cardiac risk factors, but given his recent cardiac cath, would be low-risk for ACS. He has stopped Coumadin for 10 days due to  hemorrhoidal bleeding. Diagnosed with PE in January, but CT today revealed no evidence of PE, however. No evidence of pneumonia.   - Would get second set of enzymes, if normal, will schedule outpatient exercise Myoview and 30-day event monitor given lightheadedness  - Would recommend follow-up with PCP to assess for evaluate and manage chronic issues  2. History of DVT/PE- diagnosed in 01/13 in [place], [place]. Started on Coumadin. Managed by [facility]. Has had difficulties with this given hemorrhoidal bleeding lately. Stopped for 10 days. INR subtherapeutic. As above, no evidence of PE today.   - Would continue Coumadin  - Management of hemorrhoids per PCP  3. COPD- mild, stable. Pulmonary nodules noted on CT this admission. Would follow-up in 3-6 months as noted.   4.  Tobacco abuse- 42 pack-year history. Still smokes cigars. Stressed quitting.       Signed, R. Hurman Horn, PA-C 07/19/2011, 2:28 PM 1) atypical CP 2) non obstructive CAD cath 1/13 3) Left lateral hand numbness 4) Hx of PE 2/2 DVT not on coumadin 2/2 hemmorhoidal bleeding 5) tranisent lightheadedness assoc w Nausea, flushing diaphoresis but without palpitations 6)cig abuse  Recs 1)Given the change in character of his CP and the "normalization" of his ECG i would use myoview to risk stratify. If 2nd Ez are normal this can be done as outpt, o/w inpt 2) 30 day event recorder.  Without syncope or near syncope, I dont think there is an indication to restrict driving 3) defer to ER eval of Left hand  Berton Mount MD

## 2011-07-19 NOTE — ED Notes (Signed)
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 Patient reports CP associated with N/V, diaphoresis, light headedness, numbness of left hand and arm.Patient states he took  2 aspirin and fell asleep and awoke 5 hours later. Today, patient has n/v, numbness left hand, weakness, and tremors. Patient denies CP at this time.

## 2011-07-21 NOTE — ED Provider Notes (Signed)
Medical screening examination/treatment/procedure(s) were conducted as a shared visit with non-physician practitioner(s) and myself.  I personally evaluated the patient during the encounter   Loren Racer, MD 07/21/11 [phone]

## 2011-07-22 DIAGNOSIS — W57XXXA Bitten or stung by nonvenomous insect and other nonvenomous arthropods, initial encounter: Secondary | ICD-10-CM

## 2011-07-22 HISTORY — DX: Bitten or stung by nonvenomous insect and other nonvenomous arthropods, initial encounter: W57.XXXA

## 2011-07-26 ENCOUNTER — Ambulatory Visit (HOSPITAL_COMMUNITY): Payer: BC Managed Care – PPO | Attending: Cardiovascular Disease | Admitting: Radiology

## 2011-07-26 VITALS — Ht 73.0 in | Wt 186.0 lb

## 2011-07-26 DIAGNOSIS — J449 Chronic obstructive pulmonary disease, unspecified: Secondary | ICD-10-CM | POA: Insufficient documentation

## 2011-07-26 DIAGNOSIS — R0602 Shortness of breath: Secondary | ICD-10-CM

## 2011-07-26 DIAGNOSIS — R0609 Other forms of dyspnea: Secondary | ICD-10-CM | POA: Insufficient documentation

## 2011-07-26 DIAGNOSIS — I2510 Atherosclerotic heart disease of native coronary artery without angina pectoris: Secondary | ICD-10-CM | POA: Insufficient documentation

## 2011-07-26 DIAGNOSIS — R5381 Other malaise: Secondary | ICD-10-CM | POA: Insufficient documentation

## 2011-07-26 DIAGNOSIS — R079 Chest pain, unspecified: Secondary | ICD-10-CM

## 2011-07-26 DIAGNOSIS — Z8249 Family history of ischemic heart disease and other diseases of the circulatory system: Secondary | ICD-10-CM | POA: Insufficient documentation

## 2011-07-26 DIAGNOSIS — Z87891 Personal history of nicotine dependence: Secondary | ICD-10-CM | POA: Insufficient documentation

## 2011-07-26 DIAGNOSIS — R0989 Other specified symptoms and signs involving the circulatory and respiratory systems: Secondary | ICD-10-CM | POA: Insufficient documentation

## 2011-07-26 DIAGNOSIS — J4489 Other specified chronic obstructive pulmonary disease: Secondary | ICD-10-CM | POA: Insufficient documentation

## 2011-07-26 MED ORDER — TECHNETIUM TC 99M TETROFOSMIN IV KIT
30.0000 | PACK | Freq: Once | INTRAVENOUS | Status: AC | PRN
  Administered 2011-07-26: 30 via INTRAVENOUS

## 2011-07-26 MED ORDER — TECHNETIUM TC 99M TETROFOSMIN IV KIT
10.0000 | PACK | Freq: Once | INTRAVENOUS | Status: AC | PRN
  Administered 2011-07-26: 10 via INTRAVENOUS

## 2011-07-26 NOTE — Progress Notes (Signed)
[facility] SITE 3 NUCLEAR MED [address] [place] [address] [phone]  Cardiology Nuclear Med Study  Justin Suarez is a 56 y.o. male     MRN : [id number]     DOB: 05-09-1955  Procedure Date: 07/26/2011  Nuclear Med Background Indication for Stress Test:  Evaluation for Ischemia and Post Hospital, 07/19/11: [facility] chest pain lt. Headed with (-) enzymes/EKG History:  COPD and 02/2011: PE, ECHO: EF: 55-60% --02/2011: Heart Cath: N/O Dz EF: 55% Done in [place], [place], 07/19/11: CT: Advanced coronary artery Calcification Cardiac Risk Factors: Family History - CAD and History of Smoking  Symptoms:  Chest Pain, DOE and Fatigue   Nuclear Pre-Procedure Caffeine/Decaff Intake:  None NPO After: 8:00pm   Lungs:  clear O2 Sat: 96% on room air. IV 0.9% NS with Angio Cath:  20g  IV Site: R Antecubital  IV Started by:  Milana Na, EMT-P  Chest Size (in):  44 Cup Size: n/a  Height: 6\' 1"  (1.854 m)  Weight:  186 lb (84.369 kg)  BMI:  Body mass index is 24.54 kg/(m^2). Tech Comments:  n/a    Nuclear Med Study 1 or 2 day study: 1 day  Stress Test Type:  Stress  Reading MD: Kristeen Miss, MD  Order Authorizing Provider:  G. Ladona Ridgel MD  Resting Radionuclide: Technetium 50m Tetrofosmin  Resting Radionuclide Dose: 11.0 mCi   Stress Radionuclide:  Technetium 24m Tetrofosmin  Stress Radionuclide Dose: 33.0 mCi           Stress Protocol Rest HR: 57 Stress HR: 141  Rest BP: 146/80 Stress BP: 203/82  Exercise Time (min): 10:15 METS: 12.10   Predicted Max HR: 164 bpm % Max HR: 85.98 bpm Rate Pressure Product: 21308   Dose of Adenosine (mg):  n/a Dose of Lexiscan: n/a mg  Dose of Atropine (mg): n/a Dose of Dobutamine: n/a mcg/kg/min (at max HR)  Stress Test Technologist: Milana Na, EMT-P  Nuclear Technologist:  Domenic Polite, CNMT     Rest Procedure:  Myocardial perfusion imaging was performed at rest 45 minutes following the intravenous administration of  Technetium 79m Tetrofosmin. Rest ECG: NSR - Normal EKG  Stress Procedure:  The patient performed treadmill exercise using a Bruce  Protocol for 10:15 minutes. The patient stopped due to fatigue and denied any chest pain.  There were no significant ST-T wave changes and a rare pvc with exercise.  Technetium 73m Tetrofosmin was injected at peak exercise and myocardial perfusion imaging was performed after a brief delay. Stress ECG: No significant change from baseline ECG  QPS Raw Data Images:  Normal; no motion artifact; normal heart/lung ratio. Stress Images:  Normal homogeneous uptake in all areas of the myocardium. Rest Images:  Normal homogeneous uptake in all areas of the myocardium. Subtraction (SDS):  Normal Transient Ischemic Dilatation (Normal <1.22):  1.04 Lung/Heart Ratio (Normal <0.45):  0.38  Quantitative Gated Spect Images QGS EDV:  133 ml QGS ESV:  59 ml  Impression Exercise Capacity:  Excellent exercise capacity. BP Response:  Normal blood pressure response. Clinical Symptoms:  No significant symptoms noted. ECG Impression:  No significant ST segment change suggestive of ischemia. Comparison with Prior Nuclear Study: No images to compare  Overall Impression:  Normal stress nuclear study.  There is no evidence of ischemia.  The LV function is normal.   LV Ejection Fraction: 56%.    Vesta Mixer, Montez Hageman., MD, [facility] 07/26/2011, 5:25 PM Office - [phone] Pager [phone]

## 2011-07-27 ENCOUNTER — Other Ambulatory Visit (HOSPITAL_COMMUNITY): Payer: Self-pay | Admitting: Radiology

## 2011-07-27 DIAGNOSIS — R0609 Other forms of dyspnea: Secondary | ICD-10-CM

## 2011-07-31 ENCOUNTER — Encounter: Payer: BC Managed Care – PPO | Admitting: Nurse Practitioner

## 2011-07-31 ENCOUNTER — Encounter: Payer: BC Managed Care – PPO | Admitting: Physician Assistant

## 2011-08-01 NOTE — ED Provider Notes (Signed)
Order(s) created erroneously. Erroneous order ID: [id number] Order moved by: DOHERTY-CARBONE, Adrien Dietzman M Order move date/time: 08/01/2011  4:45 PM Source Patient:    [id number] Source Contact: 07/27/2011 Destination Patient:    [id number] Destination Contact: 07/26/2011

## 2011-08-01 NOTE — ED Provider Notes (Signed)
Order(s) created erroneously. Erroneous order ID: [id number] Order moved by: Tanna Savoy Order move date/time: 08/01/2011  4:45 PM Source Patient:    [id number] Source Contact: 07/27/2011 Destination Patient:    [id number] Destination Contact: 07/26/2011

## 2011-08-07 ENCOUNTER — Encounter: Payer: Self-pay | Admitting: Nurse Practitioner

## 2011-08-07 ENCOUNTER — Ambulatory Visit (INDEPENDENT_AMBULATORY_CARE_PROVIDER_SITE_OTHER): Payer: BC Managed Care – PPO | Admitting: Nurse Practitioner

## 2011-08-07 VITALS — BP 150/78 | HR 80 | Ht 73.0 in | Wt 190.6 lb

## 2011-08-07 DIAGNOSIS — T148XXA Other injury of unspecified body region, initial encounter: Secondary | ICD-10-CM

## 2011-08-07 DIAGNOSIS — I2699 Other pulmonary embolism without acute cor pulmonale: Secondary | ICD-10-CM

## 2011-08-07 DIAGNOSIS — IMO0001 Reserved for inherently not codable concepts without codable children: Secondary | ICD-10-CM | POA: Insufficient documentation

## 2011-08-07 DIAGNOSIS — R079 Chest pain, unspecified: Secondary | ICD-10-CM | POA: Insufficient documentation

## 2011-08-07 DIAGNOSIS — S30861A Insect bite (nonvenomous) of abdominal wall, initial encounter: Secondary | ICD-10-CM | POA: Insufficient documentation

## 2011-08-07 DIAGNOSIS — W57XXXA Bitten or stung by nonvenomous insect and other nonvenomous arthropods, initial encounter: Secondary | ICD-10-CM

## 2011-08-07 DIAGNOSIS — S30860A Insect bite (nonvenomous) of lower back and pelvis, initial encounter: Secondary | ICD-10-CM

## 2011-08-07 DIAGNOSIS — R030 Elevated blood-pressure reading, without diagnosis of hypertension: Secondary | ICD-10-CM

## 2011-08-07 MED ORDER — DOXYCYCLINE HYCLATE 50 MG PO CAPS: 100.0000 mg | ORAL_CAPSULE | Freq: Two times a day (BID) | ORAL | Status: AC

## 2011-08-07 NOTE — Progress Notes (Signed)
[name] Date of Birth: 06-Feb-1956 Medical Record #[id number]  History of Present Illness: Justin Suarez is seen back today for a post hospital visit today. He is seen for Dr. Jens Som. He has had prior DVT/PE and has been on coumadin since January. 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 Has HTN and currently trying to get weaned off of caffeine but refuses medicines at this time. Was recently admitted with chest pain and had a negative hospital stay and negative Myoview. He does have an event monitor on for some lightheadedness and will be wearing this until the 25th. He has also had some issues with bleeding hemorrhoids. His blood sugar was quite elevated during that admission as well.   He comes in today. He says he is feeling better. Still smoking some. Not wanting to stop. No more chest pain. No more lightheadedness. Blood pressure is running about 150 at home. Not wanting any medicines. No more bleeding from his hemorrhoids and HAS NOT restarted his coumadin. Says it makes him feel bad and runs and his blood pressure up. He does not want to restart it.  He did have a CT with this recent admission that was negative for PE. This CT did show some lung nodules and needs to be repeated in 3 to 12 months. He has planned follow up with his PCP at the first of July. His big concern is that of a tick bite to his left side of his abdomen. It is quite swollen and red.   No current outpatient prescriptions on file prior to visit.    No Known Allergies  Past Medical History  Diagnosis Date  . Pulmonary embolus   . DVT (deep venous thrombosis)   . HTN (hypertension)   . Tobacco abuse   . Chronic anticoagulation   . Noncompliance   . Tick bite June 2013  . Abnormal CT scan, chest May 2013    multiple lung nodules; needs repeat study in 3 to 12 months.    Past Surgical History  Procedure Date  . Foot surgery   . Ankle surgery     History  Smoking status  . Current  Everyday Smoker -- 1.0 packs/day for 43 years  . Types: Cigars  Smokeless tobacco  . Never Used    History  Alcohol Use  . Yes    Occasional. 4-5 beers 12 oz./week.     Family History  Problem Relation Age of Onset  . Heart disease Mother     CHF  . Heart failure Mother   . Coronary artery disease Sister   . Heart disease Sister   . Coronary artery disease Sister   . Heart disease Sister   . Heart disease Father     Review of Systems: The review of systems is per the HPI.  All other systems were reviewed and are negative.  Physical Exam: BP 150/78  Pulse 80  Ht 6\' 1"  (1.854 m)  Wt 190 lb 9.6 oz (86.456 kg)  BMI 25.15 kg/m2 Patient is very pleasant and in no acute distress. Skin is warm and dry. Color is normal.  HEENT is unremarkable. Normocephalic/atraumatic. PERRL. Sclera are nonicteric. Neck is supple. No masses. No JVD. Lungs are fairly clear. Cardiac exam shows a regular rate and rhythm. Abdomen is soft. He has a baseball size hardness that is red, swollen and warm to touch. Extremities are without edema. Gait and ROM are intact. No gross neurologic deficits noted.   LABORATORY DATA:  Myoview Impression  Exercise Capacity: Excellent exercise capacity.  BP Response: Normal blood pressure response.  Clinical Symptoms: No significant symptoms noted.  ECG Impression: No significant ST segment change suggestive of ischemia.  Comparison with Prior Nuclear Study: No images to compare  Overall Impression: Normal stress nuclear study. There is no evidence of ischemia. The LV function is normal. LV Ejection Fraction: 56%.     Dg Chest 2 View  07/19/2011  *RADIOLOGY REPORT*  Clinical Data: Left-sided chest pain with left arm numbness.  CHEST - 2 VIEW  Comparison: None.  Findings: Trachea is midline.  Heart size normal.  Lungs are hyperinflated but clear.  No pleural fluid.  IMPRESSION: No acute findings.  Original Report Authenticated By: Reyes Ivan, M.D.   Ct  Angio Chest W/cm &/or Wo Cm  07/19/2011  *RADIOLOGY REPORT*  Clinical Data: Chest pain, weakness, diaphoresis and elevated D- dimer.  History of pulmonary emboli.  Question PE.  CT ANGIOGRAPHY CHEST  Technique:  Multidetector CT imaging of the chest using the standard protocol during bolus administration of intravenous contrast. Multiplanar reconstructed images including MIPs were obtained and reviewed to evaluate the vascular anatomy.  Contrast: OMNIPAQUE IOHEXOL 300 MG/ML  SOLN  Comparison: Chest radiograph 07/19/2011.  Findings: No pulmonary embolus.  Mediastinal and hilar lymph nodes are not enlarged by CT size criteria.  No axillary adenopathy. Bilateral gynecomastia.  Heart size normal.  Coronary artery calcification appears age advanced.  No pericardial effusion.  Scattered small pulmonary nodules measure up to 8 mm (10 x 6 mm) on image 72 in the left lower lobe.  Minimal dependent atelectasis bilaterally.  Mild expiratory phase imaging creates added density in the lungs bilaterally.  No pleural fluid.  Airway is otherwise unremarkable.  Incidental imaging of the upper abdomen shows no acute findings. No worrisome lytic or sclerotic lesions.  IMPRESSION:  1.  No pulmonary embolus. 2.  Coronary artery calcification, age advanced. 3.  Scattered pulmonary nodules, measuring up to 8 mm. If the patient is at high risk for bronchogenic carcinoma, follow-up chest CT at 3-6 months is recommended.  If the patient is at low risk for bronchogenic carcinoma, follow-up chest CT at 6-12 months is recommended.  This recommendation follows the consensus statement: Guidelines for Management of Small Pulmonary Nodules Detected on CT Scans: A Statement from the Fleischner Society as published in Radiology 2005; 237:395-400.  Original Report Authenticated By: Reyes Ivan, M.D.    Lab Results  Component Value Date   WBC 5.9 07/19/2011   HGB 15.1 07/19/2011   HCT 43.6 07/19/2011   PLT 250 07/19/2011   GLUCOSE  234* 07/19/2011   NA 135 07/19/2011   K 3.7 07/19/2011   CL 97 07/19/2011   CREATININE 0.76 07/19/2011   BUN 8 07/19/2011   CO2 24 07/19/2011   INR 0.92 07/19/2011      Assessment / Plan:

## 2011-08-07 NOTE — Assessment & Plan Note (Signed)
He has a significant size bite. I have given him a 10 day course of Doxycycline. He is to see his PCP if no improvement.

## 2011-08-07 NOTE — Assessment & Plan Note (Signed)
Currently off of his coumadin due to side effects. No further bleeding from his hemorrhoids. I explained that coumadin does not affect blood pressure. He is willing to restart. Will have him discuss further at his visit with Dr. Jens Som next month. May be a candidate for Xarelto. 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

## 2011-08-07 NOTE — Assessment & Plan Note (Signed)
Blood pressure is elevated here and at home. Not willing to start medicines yet. He has planned follow up with his PCP and I will have him see Dr. Jens Som in early July for discussion as well.

## 2011-08-07 NOTE — Assessment & Plan Note (Addendum)
He has had a negative Myoview. Smoking cessation has been encouraged. Has had a negative CT of the chest for PE but does have some nodules and will need a repeat scan in 3 to 12 months. Still has his event monitor in place. I will have him see Dr. Jens Som at the first part of July for further discussion. Patient is agreeable to this plan and will call if any problems develop in the interim.

## 2011-08-07 NOTE — Patient Instructions (Addendum)
I recommend you restart your Coumadin at your prior dose  Check a protime in one week  Continue to wear the heart monitor.  I will have you see Dr. Jens Som the first part of July. You can discuss with him how you are doing on restarting the coumadin and your monitor results  I am going to give you some antibiotics for your tick bite  Continue to monitor your blood pressure at home and keep a diary. Bring this in for review for Dr. Jens Som  Call the [facility] office at [phone] if you have any questions, problems or concerns.

## 2011-08-28 ENCOUNTER — Telehealth: Payer: Self-pay | Admitting: *Deleted

## 2011-08-28 NOTE — Telephone Encounter (Signed)
Left message for pt to call, monitor reviewed by dr crenshaw shows sinus 

## 2011-09-08 NOTE — Telephone Encounter (Signed)
LMTCB ./CY 

## 2011-09-22 ENCOUNTER — Encounter: Payer: BC Managed Care – PPO | Admitting: Cardiology

## 2011-09-22 NOTE — Progress Notes (Signed)
   HPI: Pleasant male for fu of chest pain. I initially saw the patient in Jan 2013. He had recently been seen in [place] for CP and dyspnea. Left heart catheterization revealed minor plaquing but no obstructive coronary disease. The ejection fraction was 55%. Left ventricular end-diastolic pressure was 9. Patient's d-dimer was elevated and apparently a chest CT showed pulmonary embolus. He has been treated with Coumadin. Echocardiogram in January of 2013 showed an ejection fraction of 55-60%, grade 2 diastolic dysfunction, mild left atrial enlargement. Patient seen in the emergency room in may with complaints of chest pain and light headed feeling. A CT showed no pulmonary embolus but there was coronary artery calcification. There is a pulmonary nodule and followup recommended in 3-12 months. Myoview in June of 2013 showed an ejection fraction of 56% and normal perfusion. Event monitor in July of 2013 showed sinus rhythm. Patient seen in [place] 6/13. Since then,    No current outpatient prescriptions on file.     Past Medical History  Diagnosis Date  . Pulmonary embolus   . DVT (deep venous thrombosis)   . HTN (hypertension)   . Tobacco abuse   . Chronic anticoagulation   . Noncompliance   . Tick bite June 2013  . Abnormal CT scan, chest May 2013    multiple lung nodules; needs repeat study in 3 to 12 months.    Past Surgical History  Procedure Date  . Foot surgery   . Ankle surgery     History   Social History  . Marital Status: Legally Separated    Spouse Name: N/A    Number of Children: 0  . Years of Education: N/A   Occupational History  .      Long distance truck driver   Social History Main Topics  . Smoking status: Current Everyday Smoker -- 1.0 packs/day for 43 years    Types: Cigars  . Smokeless tobacco: Never Used  . Alcohol Use: Yes     Occasional. 4-5 beers 12 oz./week.   . Drug Use: No  . Sexually Active: Not Currently   Other Topics Concern  . Not on file    Social History Narrative   He works as a Naval architect. Lives in [place], [place].     ROS: no fevers or chills, productive cough, hemoptysis, dysphasia, odynophagia, melena, hematochezia, dysuria, hematuria, rash, seizure activity, orthopnea, PND, pedal edema, claudication. Remaining systems are negative.  Physical Exam: Well-developed well-nourished in no acute distress.  Skin is warm and dry.  HEENT is normal.  Neck is supple. No thyromegaly.  Chest is clear to auscultation with normal expansion.  Cardiovascular exam is regular rate and rhythm.  Abdominal exam nontender or distended. No masses palpated. Extremities show no edema. neuro grossly intact  ECG     This encounter was created in error - please disregard.

## 2011-10-13 ENCOUNTER — Encounter: Payer: Self-pay | Admitting: *Deleted

## 2011-10-13 NOTE — Telephone Encounter (Signed)
Letter of results sent to pt  

## 2012-10-16 ENCOUNTER — Telehealth: Payer: Self-pay | Admitting: *Deleted

## 2012-10-16 NOTE — Telephone Encounter (Signed)
Called and left message to call clinic as he has not been seen since April 2013

## 2020-01-14 ENCOUNTER — Encounter: Payer: Self-pay | Admitting: *Deleted

## 2020-02-04 ENCOUNTER — Ambulatory Visit (INDEPENDENT_AMBULATORY_CARE_PROVIDER_SITE_OTHER)
Admission: RE | Admit: 2020-02-04 | Discharge: 2020-02-04 | Disposition: A | Discharge status: Home / Self Care | Payer: Medicare Other | Source: Ambulatory Visit | Attending: Internal Medicine | Admitting: Internal Medicine

## 2020-02-04 ENCOUNTER — Ambulatory Visit: Payer: Medicare Other | Admitting: Internal Medicine

## 2020-02-04 ENCOUNTER — Encounter: Payer: Self-pay | Admitting: Internal Medicine

## 2020-02-04 ENCOUNTER — Other Ambulatory Visit: Payer: Self-pay

## 2020-02-04 VITALS — BP 160/98 | HR 78 | Ht 73.0 in | Wt 180.0 lb

## 2020-02-04 DIAGNOSIS — K838 Other specified diseases of biliary tract: Secondary | ICD-10-CM | POA: Diagnosis not present

## 2020-02-04 DIAGNOSIS — R062 Wheezing: Secondary | ICD-10-CM

## 2020-02-04 DIAGNOSIS — F17200 Nicotine dependence, unspecified, uncomplicated: Secondary | ICD-10-CM | POA: Diagnosis not present

## 2020-02-04 DIAGNOSIS — R1011 Right upper quadrant pain: Secondary | ICD-10-CM | POA: Diagnosis not present

## 2020-02-04 NOTE — Progress Notes (Signed)
HISTORY OF PRESENT ILLNESS:  Justin Suarez is a 64 y.o. male, new to this practice, with a history of hypertension, COPD, and prior DVT.  On no medications.  Sent by his primary care provider Justin Suarez regarding right upper quadrant pain and dilated bile duct on imaging.  Patient states that he developed right upper quadrant pain approximately 3 months ago.  He describes 3 discrete episodes which were severe.  Radiation into the back.  Lasted several days.  This was associated with nausea and vomiting as well as decreased appetite.  No fevers.  No dark urine.  In between episodes he describes slight chronic discomfort.  His weight has been stable.  He is a chronic smoker.  He does have occasional reflux.  No dysphagia.  No prior history of colon cancer screening.  After requesting outside records blood work from November 18, 2019 reveals unremarkable comprehensive metabolic panel.  Normal liver tests.  Unremarkable CBC with hemoglobin 14.7.  White blood cell count 6.3.  Abdominal ultrasound performed November 20, 2019 revealed gallbladder sludge but no stones.  Bile duct was said to be prominent and measure 9 mm.  Also, possible hepatic steatosis.  No other abnormalities.  MRI recommended.  This was performed December 02, 2019.  The bile duct was said to be mildly enlarged at 8 mm.  The gallbladder was mildly distended.  The bile duct was said to taper at the ampulla but NO choledocholithiasis or obvious obstructing lesion.  There was also a 1.6 x 1.4 cm nonenhancing lesion of the inferior pancreatic head/uncinate process which appeared to abut the main pancreatic duct.  IPMN versus pseudocyst was the differential.  Repeat MRI in 1 year to assure stability was recommended.  Patient tells me that he is feeling fine at this point.  REVIEW OF SYSTEMS:  All non-GI ROS negative unless otherwise stated in the HPI except for arthritis, back pain, headaches, mild right extremity edema  Past Medical History:   Diagnosis Date  . Abnormal CT scan, chest May 2013   multiple lung nodules; needs repeat study in 3 to 12 months.  . Chronic anticoagulation   . Common bile duct dilation   . COPD (chronic obstructive pulmonary disease) ([place])   . DVT (deep venous thrombosis) ([place])   . HTN (hypertension)   . Noncompliance   . Pulmonary embolus ([place])   . Tick bite June 2013  . Tobacco abuse     Past Surgical History:  Procedure Laterality Date  . ANKLE SURGERY Right    x 1 from a fall  . FOOT SURGERY Right    x 3 from a fall  . INGUINAL HERNIA REPAIR Right     Social History Justin Suarez  reports that he has been smoking cigarettes. He has a 43.00 pack-year smoking history. He has never used smokeless tobacco. He reports current alcohol use. He reports that he does not use drugs.  family history includes Atrial fibrillation in his brother; Coronary artery disease in his sister and sister; Heart disease in his father, mother, sister, and sister; Heart failure in his mother.  No Known Allergies     PHYSICAL EXAMINATION: Vital signs: BP (!) 160/98   Pulse 78   Ht 6\' 1"  (1.854 m)   Wt 180 lb (81.6 kg)   BMI 23.75 kg/m   Constitutional: generally well-appearing, no acute distress Psychiatric: alert and oriented x3, cooperative Eyes: extraocular movements intact, anicteric, conjunctiva pink Mouth: oral pharynx moist, no lesions Neck: supple  no lymphadenopathy Cardiovascular: heart regular rate and rhythm, no murmur Lungs: Wheezing and decreased breath sounds right base.  Otherwise normal breath sounds Abdomen: soft, mild epigastric and right upper quadrant tenderness, nondistended, no obvious ascites, no peritoneal signs, normal bowel sounds, no organomegaly Rectal: Omitted Extremities: no clubbing, or cyanosis.  Trace right lower extremity edema. Skin: no lesions on visible extremities Neuro: No focal deficits. No asterixis.   ASSESSMENT:  1.  Recurrent right upper quadrant pain,  severe at times, with gallbladder sludge on imaging.  Rule out biliary colic. 2.  Mild bile duct dilation with no evidence for obstructing lesion or stones imaging. 3.  Normal liver tests. 4.  Indeterminate pancreatic lesion as described 5.  Abnormal breath sounds on physical exam today in a chronic smoker.  Rule out pleuritic base process of the right to explain symptoms and physical exam findings.   PLAN:  1.  PA lateral chest x-ray "rule out right lower lobe lesion" 2.  At this point (assuming he does not have right lung cancer) I would recommend endoscopic ultrasound to further interrogate the bile duct (and pancreas).  My algorithm, thereafter, as follows:  a) If choledocholithiasis is found, then proceed to same-day ERCP.  Post ERCP with bile duct clearance, refer to general surgery for laparoscopic cholecystectomy.  b) If no choledocholithiasis (or other lesion) on the EUS, then general surgical referral for laparoscopic cholecystectomy. 3.  I will forward this note to my EUS colleagues for review. 4.  Ongoing general medical care with Justin Suarez  A total time of 60 minutes was required preparing to see the patient, reviewing laboratory tests and x-rays, obtaining comprehensive history and performing comprehensive physical examination.  Counseling the patient regarding his above listed issues.  Ordering advanced endoscopic procedure and additional x-ray imaging.  Finally, documenting clinical information in the health record

## 2020-02-04 NOTE — Patient Instructions (Signed)
Please go to the x-ray department in the basement before leaving today.  Due to recent changes in healthcare laws, you may see the results of your imaging and laboratory studies on MyChart before your provider has had a chance to review them.  We understand that in some cases there may be results that are confusing or concerning to you. Not all laboratory results come back in the same time frame and the provider may be waiting for multiple results in order to interpret others.  Please give [place] 48 hours in order for your provider to thoroughly review all the results before contacting the office for clarification of your results.   We will be in touch about getting an EUS done by either Dr Owens Loffler or Dr Justice Britain.   I appreciate the opportunity to care for you. Scarlette Shorts, MD

## 2020-02-05 ENCOUNTER — Telehealth: Payer: Self-pay

## 2020-02-05 ENCOUNTER — Other Ambulatory Visit: Payer: Self-pay

## 2020-02-05 DIAGNOSIS — R109 Unspecified abdominal pain: Secondary | ICD-10-CM

## 2020-02-05 DIAGNOSIS — K838 Other specified diseases of biliary tract: Secondary | ICD-10-CM

## 2020-02-05 DIAGNOSIS — R948 Abnormal results of function studies of other organs and systems: Secondary | ICD-10-CM

## 2020-02-05 NOTE — Telephone Encounter (Signed)
[]  Hover for details  Justin Suarez, I completely agree with your plan.  [place], He needs EUS +/- ERCP with either myself or Gabe, first available appointment for a 'double apt'.  Diagnosis abd pain, abnormal bile duct and pancreas.  [name]

## 2020-02-05 NOTE — Telephone Encounter (Signed)
Patient is returning your call.  

## 2020-02-05 NOTE — Telephone Encounter (Signed)
EUS scheduled, pt instructed and medications reviewed.  Patient instructions mailed to home.  Patient to call with any questions or concerns.  

## 2020-02-05 NOTE — Telephone Encounter (Signed)
-----   Message from Milus Banister, MD sent at 02/05/2020  7:19 AM EST -----   ----- Message ----- From: Irene Shipper, MD Sent: 02/04/2020   4:13 PM EST To: Milus Banister, MD, #  [place], For consideration of EUS.  Thanks Jenny Reichmann

## 2020-02-05 NOTE — Progress Notes (Signed)
Justin Suarez, I completely agree with your plan.  Justin Suarez, He needs EUS +/- ERCP with either myself or Gabe, first available appointment for a 'double apt'.  Diagnosis abd pain, abnormal bile duct and pancreas.  [name]

## 2020-02-05 NOTE — Telephone Encounter (Signed)
EUS ERCP scheduled for 03/18/20 at 830 am at [facility] with Dr Ardis Hughs COVID test 03/15/20 at 1035 am

## 2020-03-15 ENCOUNTER — Other Ambulatory Visit (HOSPITAL_COMMUNITY)
Admission: RE | Admit: 2020-03-15 | Discharge: 2020-03-15 | Disposition: A | Discharge status: Home / Self Care | Payer: Medicare Other | Source: Ambulatory Visit | Attending: Gastroenterology | Admitting: Gastroenterology

## 2020-03-15 DIAGNOSIS — U071 COVID-19: Secondary | ICD-10-CM | POA: Diagnosis not present

## 2020-03-15 DIAGNOSIS — Z01812 Encounter for preprocedural laboratory examination: Secondary | ICD-10-CM | POA: Insufficient documentation

## 2020-03-15 LAB — SARS CORONAVIRUS 2 (TAT 6-24 HRS): SARS Coronavirus 2: POSITIVE — AB

## 2020-03-16 ENCOUNTER — Telehealth: Payer: Self-pay | Admitting: Nurse Practitioner

## 2020-03-16 ENCOUNTER — Telehealth: Payer: Self-pay | Admitting: Internal Medicine

## 2020-03-16 NOTE — Progress Notes (Signed)
Patient with positive covid result. Contacted MD and informed of result.   

## 2020-03-16 NOTE — Telephone Encounter (Signed)
Inbound call from [place] at the [facility] Pre-Procedure Site stating patient tested positive for covid.

## 2020-03-16 NOTE — Telephone Encounter (Signed)
Noted pt will be called see alternate note dated 1/25

## 2020-03-16 NOTE — Telephone Encounter (Signed)
Called to Discuss with patient about Covid symptoms and the use of the monoclonal antibody infusion for those with mild to moderate Covid symptoms and at a high risk of hospitalization.     Pt appears to qualify for this infusion due to co-morbid conditions and/or a member of an at-risk group in accordance with the FDA Emergency Use Authorization.    Unable to reach pt. Voicemail left.   Symptom onset: Will need to speak w/patient Vaccinated: none on file  Qualified for Infusion: Yes, if symptomatic and within guidelines.   Alda Lea, NP [facility]  [phone]

## 2020-03-17 ENCOUNTER — Encounter (HOSPITAL_COMMUNITY): Payer: Self-pay | Admitting: Gastroenterology

## 2020-04-01 ENCOUNTER — Ambulatory Visit (HOSPITAL_COMMUNITY): Payer: Medicare Other | Admitting: Certified Registered Nurse Anesthetist

## 2020-04-01 ENCOUNTER — Telehealth: Payer: Self-pay

## 2020-04-01 ENCOUNTER — Other Ambulatory Visit: Payer: Self-pay

## 2020-04-01 ENCOUNTER — Encounter (HOSPITAL_COMMUNITY): Payer: Self-pay | Admitting: Gastroenterology

## 2020-04-01 ENCOUNTER — Encounter (HOSPITAL_COMMUNITY)
Admission: RE | Disposition: A | Discharge status: Home / Self Care | Payer: Self-pay | Source: Home / Self Care | Attending: Gastroenterology

## 2020-04-01 ENCOUNTER — Ambulatory Visit (HOSPITAL_COMMUNITY)
Admission: RE | Admit: 2020-04-01 | Discharge: 2020-04-01 | Disposition: A | Discharge status: Home / Self Care | Payer: Medicare Other | Attending: Gastroenterology | Admitting: Gastroenterology

## 2020-04-01 DIAGNOSIS — R1011 Right upper quadrant pain: Secondary | ICD-10-CM | POA: Diagnosis not present

## 2020-04-01 DIAGNOSIS — Z7901 Long term (current) use of anticoagulants: Secondary | ICD-10-CM | POA: Diagnosis not present

## 2020-04-01 DIAGNOSIS — K862 Cyst of pancreas: Secondary | ICD-10-CM | POA: Diagnosis not present

## 2020-04-01 DIAGNOSIS — K861 Other chronic pancreatitis: Secondary | ICD-10-CM

## 2020-04-01 DIAGNOSIS — K838 Other specified diseases of biliary tract: Secondary | ICD-10-CM | POA: Diagnosis not present

## 2020-04-01 DIAGNOSIS — Z86718 Personal history of other venous thrombosis and embolism: Secondary | ICD-10-CM | POA: Diagnosis not present

## 2020-04-01 DIAGNOSIS — Z8249 Family history of ischemic heart disease and other diseases of the circulatory system: Secondary | ICD-10-CM | POA: Insufficient documentation

## 2020-04-01 DIAGNOSIS — J449 Chronic obstructive pulmonary disease, unspecified: Secondary | ICD-10-CM | POA: Insufficient documentation

## 2020-04-01 DIAGNOSIS — R110 Nausea: Secondary | ICD-10-CM | POA: Diagnosis not present

## 2020-04-01 DIAGNOSIS — Z86711 Personal history of pulmonary embolism: Secondary | ICD-10-CM | POA: Insufficient documentation

## 2020-04-01 DIAGNOSIS — R109 Unspecified abdominal pain: Secondary | ICD-10-CM

## 2020-04-01 DIAGNOSIS — F17210 Nicotine dependence, cigarettes, uncomplicated: Secondary | ICD-10-CM | POA: Diagnosis not present

## 2020-04-01 DIAGNOSIS — R948 Abnormal results of function studies of other organs and systems: Secondary | ICD-10-CM

## 2020-04-01 HISTORY — PX: ESOPHAGOGASTRODUODENOSCOPY (EGD) WITH PROPOFOL: SHX5813

## 2020-04-01 HISTORY — PX: EUS: SHX5427

## 2020-04-01 SURGERY — UPPER ENDOSCOPIC ULTRASOUND (EUS) RADIAL
Anesthesia: Monitor Anesthesia Care

## 2020-04-01 MED ORDER — PROPOFOL 500 MG/50ML IV EMUL
INTRAVENOUS | Status: AC
  Filled 2020-04-01: qty 150

## 2020-04-01 MED ORDER — GLUCAGON HCL RDNA (DIAGNOSTIC) 1 MG IJ SOLR
INTRAMUSCULAR | Status: AC
  Filled 2020-04-01: qty 1

## 2020-04-01 MED ORDER — LACTATED RINGERS IV SOLN: INTRAVENOUS | Status: DC | PRN

## 2020-04-01 MED ORDER — INDOMETHACIN 50 MG RE SUPP
RECTAL | Status: AC
  Filled 2020-04-01: qty 2

## 2020-04-01 MED ORDER — PROPOFOL 500 MG/50ML IV EMUL
INTRAVENOUS | Status: DC | PRN
Start: 2020-04-01 — End: 2020-04-01
  Administered 2020-04-01: 100 ug/kg/min via INTRAVENOUS

## 2020-04-01 MED ORDER — SODIUM CHLORIDE 0.9 % IV SOLN: INTRAVENOUS | Status: DC

## 2020-04-01 MED ORDER — LIDOCAINE 2% (20 MG/ML) 5 ML SYRINGE
INTRAMUSCULAR | Status: DC | PRN
  Administered 2020-04-01: 80 mg via INTRAVENOUS

## 2020-04-01 MED ORDER — PROPOFOL 10 MG/ML IV BOLUS
INTRAVENOUS | Status: DC | PRN
  Administered 2020-04-01 (×3): 30 mg via INTRAVENOUS

## 2020-04-01 MED ORDER — LACTATED RINGERS IV SOLN: Freq: Once | INTRAVENOUS | Status: AC

## 2020-04-01 NOTE — Discharge Instructions (Signed)
YOU HAD AN ENDOSCOPIC PROCEDURE TODAY: Refer to the procedure report and other information in the discharge instructions given to you for any specific questions about what was found during the examination. If this information does not answer your questions, please call [place] office at [phone] to clarify.  ° °YOU SHOULD EXPECT: Some feelings of bloating in the abdomen. Passage of more gas than usual. Walking can help get rid of the air that was put into your GI tract during the procedure and reduce the bloating. If you had a lower endoscopy (such as a colonoscopy or flexible sigmoidoscopy) you may notice spotting of blood in your stool or on the toilet paper. Some abdominal soreness may be present for a day or two, also. ° °DIET: Your first meal following the procedure should be a light meal and then it is ok to progress to your normal diet. A half-sandwich or bowl of soup is an example of a good first meal. Heavy or fried foods are harder to digest and may make you feel nauseous or bloated. Drink plenty of fluids but you should avoid alcoholic beverages for 24 hours. If you had a esophageal dilation, please see attached instructions for diet.   ° °ACTIVITY: Your care partner should take you home directly after the procedure. You should plan to take it easy, moving slowly for the rest of the day. You can resume normal activity the day after the procedure however YOU SHOULD NOT DRIVE, use power tools, machinery or perform tasks that involve climbing or major physical exertion for 24 hours (because of the sedation medicines used during the test).  ° °SYMPTOMS TO REPORT IMMEDIATELY: °A gastroenterologist can be reached at any hour. Please call [phone]  for any of the following symptoms:  °Following lower endoscopy (colonoscopy, flexible sigmoidoscopy) °Excessive amounts of blood in the stool  °Significant tenderness, worsening of abdominal pains  °Swelling of the abdomen that is new, acute  °Fever of 100° or  higher  °Following upper endoscopy (EGD, EUS, ERCP, esophageal dilation) °Vomiting of blood or coffee ground material  °New, significant abdominal pain  °New, significant chest pain or pain under the shoulder blades  °Painful or persistently difficult swallowing  °New shortness of breath  °Black, tarry-looking or red, bloody stools ° °FOLLOW UP:  °If any biopsies were taken you will be contacted by phone or by letter within the next 1-3 weeks. Call [phone]  if you have not heard about the biopsies in 3 weeks.  °Please also call with any specific questions about appointments or follow up tests. ° °

## 2020-04-01 NOTE — Telephone Encounter (Signed)
Pt in for recall for MRI in 2 years. Referral faxed to [facility] for surgery.

## 2020-04-01 NOTE — Op Note (Addendum)
[facility] Patient Name: Justin Suarez Procedure Date: 04/01/2020 MRN: [id number] Attending MD: Milus Banister , MD Date of Birth: November 10, 1955 CSN: [id number] Age: 65 Admit Type: Outpatient Procedure:                Upper EUS Indications:              Four episodes of RUQ pains +nausea. Normal LFTs.                            Outside MRI suggested dilated bile duct,                            non-dilated main PD, non-enhancing cystic lesion in                            pancreas (inferior pancreatic head/uncinate).                            Previous alcoholic (drank 1 case of beer daily for                            many years), no FH of pancreatic disease, no weight                            loss. Providers:                Milus Banister, MD, Baird Cancer, RN, Lesia Sago, Technician Referring MD:             Scarlette Shorts, MD Medicines:                Monitored Anesthesia Care Complications:            No immediate complications. Estimated blood loss:                            None. Estimated Blood Loss:     Estimated blood loss: none. Procedure:                Pre-Anesthesia Assessment:                           - Prior to the procedure, a History and Physical                            was performed, and patient medications and                            allergies were reviewed. The patient's tolerance of                            previous anesthesia was also reviewed. The risks                            and benefits of the  procedure and the sedation                            options and risks were discussed with the patient.                            All questions were answered, and informed consent                            was obtained. Prior Anticoagulants: The patient has                            taken no previous anticoagulant or antiplatelet                            agents. ASA Grade Assessment: II - A patient  with                            mild systemic disease. After reviewing the risks                            and benefits, the patient was deemed in                            satisfactory condition to undergo the procedure.                           After obtaining informed consent, the endoscope was                            passed under direct vision. Throughout the                            procedure, the patient's blood pressure, pulse, and                            oxygen saturations were monitored continuously. The                            [id number] ([id number] Olympus Radial EUS was                            introduced through the mouth, and advanced to the                            second part of duodenum. The Olympus [id number]                            [id number]) was introduced through the mouth, and                            advanced to the second part of duodenum. The upper  EUS was accomplished without difficulty. The                            patient tolerated the procedure well. Scope In: Scope Out: Findings:      ENDOSCOPIC FINDING: :      The examined esophagus was endoscopically normal.      The entire examined stomach was endoscopically normal.      The examined duodenum was endoscopically normal including very good       visualization of normal major papilla with a duodenoscope.      ENDOSONOGRAPHIC FINDING: :      1. The pancreatic parenchyma was abnormal throughout the gland. It was       more hypoechoic than usual, lobular and seemed to contain small       shadowing calcifications. The main pancreatic duct was ectatic but not       dilated. There were no discrete solid masses.      2. There was a 1.5cm thinly septated cystic lesion in the uncinate       pancreas that was poorly visualized but there were no obvious associated       solid mass lesions.      3. CBD was slightly dilated into the head of pancreas (max diameter 15mm)        and it did not contain stones.      4. Gallbladder was thick walled (2-59mm) and did not contain any stones.      5. Limited views of the liver, spleen, portal and splenic vessels were       all normal Impression:               - Findings consistent with chronic pancreatitis,                            probably etoh related given his former heavy                            alcohol abuse (a case of beer daily for many years,                            much less lately). The changes of chronic                            pancreatitis derease the ability of imaging tests                            (including this EUS) to detect neoplastic growths.                           - Cystic lesion in uncinate pancreas without                            concerning morophologic features on this exam or                            recent MRI. This is probably a pseudocyst, I  recommend repeat MRI in 2 years to ensure stability.                           - No gallstones or CBD stones but his gallbladder                            is thickwalled. His symptoms are fairly classic for                            biliary colic. Moderate Sedation:      Not Applicable - Patient had care per Anesthesia. Recommendation:           - Discharge patient to home.                           - I will communicate these findings with Dr. Henrene Pastor. Procedure Code(s):        --- Professional ---                           [phone], Esophagogastroduodenoscopy, flexible,                            transoral; with endoscopic ultrasound examination                            limited to the esophagus, stomach or duodenum, and                            adjacent structures Diagnosis Code(s):        --- Professional ---                           K86.1, Other chronic pancreatitis                           K83.8, Other specified diseases of biliary tract CPT copyright 2019 [employer]. All  rights reserved. The codes documented in this report are preliminary and upon coder review may  be revised to meet current compliance requirements. Milus Banister, MD 04/01/2020 11:25:59 AM This report has been signed electronically. Number of Addenda: 0

## 2020-04-01 NOTE — Anesthesia Preprocedure Evaluation (Signed)
Anesthesia Evaluation  Patient identified by MRN, date of birth, ID band Patient awake    Reviewed: Allergy & Precautions, H&P , NPO status , Patient's Chart, lab work & pertinent test results, reviewed documented beta blocker date and time   Airway Mallampati: II  TM Distance: >3 FB Neck ROM: full    Dental no notable dental hx.    Pulmonary COPD, Current Smoker,    Pulmonary exam normal breath sounds clear to auscultation       Cardiovascular Exercise Tolerance: Good hypertension, Pt. on medications  Rhythm:regular Rate:Normal     Neuro/Psych negative neurological ROS  negative psych ROS   GI/Hepatic negative GI ROS, Neg liver ROS,   Endo/Other  negative endocrine ROS  Renal/GU negative Renal ROS  negative genitourinary   Musculoskeletal   Abdominal   Peds  Hematology negative hematology ROS (+)   Anesthesia Other Findings   Reproductive/Obstetrics negative OB ROS                             Anesthesia Physical Anesthesia Plan  ASA: III  Anesthesia Plan: MAC   Post-op Pain Management:    Induction: Intravenous  PONV Risk Score and Plan: 1  Airway Management Planned: Mask, Natural Airway and Nasal Cannula  Additional Equipment: None  Intra-op Plan:   Post-operative Plan:   Informed Consent: I have reviewed the patients History and Physical, chart, labs and discussed the procedure including the risks, benefits and alternatives for the proposed anesthesia with the patient or authorized representative who has indicated his/her understanding and acceptance.     Dental Advisory Given  Plan Discussed with: CRNA and Anesthesiologist  Anesthesia Plan Comments:         Anesthesia Quick Evaluation

## 2020-04-01 NOTE — H&P (Signed)
HPI: This is a man referred by Dr. Henrene Pastor  Outside MRI This was performed December 02, 2019.  The bile duct was said to be mildly enlarged at 8 mm.  The gallbladder was mildly distended.  The bile duct was said to taper at the ampulla but NO choledocholithiasis or obvious obstructing lesion.  There was also a 1.6 x 1.4 cm nonenhancing lesion of the inferior pancreatic head/uncinate process which appeared to abut the main pancreatic duct.  IPMN versus pseudocyst was the differential.   3 episodes of RUQ pain, normal LFTs (sept 2021)   ROS: complete GI ROS as described in HPI, all other review negative.  Constitutional:  No unintentional weight loss   Past Medical History:  Diagnosis Date  . Abnormal CT scan, chest May 2013   multiple lung nodules; needs repeat study in 3 to 12 months.  . Chronic anticoagulation   . Common bile duct dilation   . COPD (chronic obstructive pulmonary disease) ([place])   . DVT (deep venous thrombosis) ([place])   . HTN (hypertension)   . Noncompliance   . Pulmonary embolus ([place])   . Tick bite June 2013  . Tobacco abuse     Past Surgical History:  Procedure Laterality Date  . ANKLE SURGERY Right    x 1 from a fall  . FOOT SURGERY Right    x 3 from a fall  . INGUINAL HERNIA REPAIR Right     Current Facility-Administered Medications  Medication Dose Route Frequency Provider Last Rate Last Admin  . 0.9 %  sodium chloride infusion   Intravenous Continuous Milus Banister, MD        Allergies as of 02/05/2020  . (No Known Allergies)    Family History  Problem Relation Age of Onset  . Heart disease Mother        CHF  . Heart failure Mother   . Coronary artery disease Sister   . Heart disease Sister   . Coronary artery disease Sister   . Heart disease Sister   . Heart disease Father   . Atrial fibrillation Brother   . Esophageal cancer Neg Hx   . Colon cancer Neg Hx   . Rectal cancer Neg Hx     Social History   Socioeconomic History  .  Marital status: Legally Separated    Spouse name: Not on file  . Number of children: 0  . Years of education: Not on file  . Highest education level: Not on file  Occupational History  . Occupation: retired    Comment: Long distance Administrator  Tobacco Use  . Smoking status: Current Every Day Smoker    Packs/day: 1.00    Years: 43.00    Pack years: 43.00    Types: Cigarettes  . Smokeless tobacco: Never Used  Vaping Use  . Vaping Use: Never used  Substance and Sexual Activity  . Alcohol use: Yes    Comment: Occasional. 4-5 beers 12 oz./week.   . Drug use: No  . Sexual activity: Not Currently  Other Topics Concern  . Not on file  Social History Narrative   He works as a Administrator. Lives in [place], [place].    Social Determinants of Health   Financial Resource Strain: Not on file  Food Insecurity: Not on file  Transportation Needs: Not on file  Physical Activity: Not on file  Stress: Not on file  Social Connections: Not on file  Intimate Partner Violence: Not on file  Physical Exam: BP (!) 180/89   Pulse 74   Temp 98.2 F (36.8 C) (Oral)   Resp 11   Ht 6' (1.829 m)   Wt 82.6 kg   SpO2 100%   BMI 24.68 kg/m  Constitutional: generally well-appearing Psychiatric: alert and oriented x3 Abdomen: soft, nontender, nondistended, no obvious ascites, no peritoneal signs, normal bowel sounds No peripheral edema noted in lower extremities  Assessment and plan: 65 y.o. male with abnormal pancreas, bile ducts, normal LFTs, previous RUQ pains  For evaluation with EUS +/- ERCP  Please see the "Patient Instructions" section for addition details about the plan.  Owens Loffler, MD [facility] 04/01/2020, 10:07 AM

## 2020-04-01 NOTE — Anesthesia Postprocedure Evaluation (Signed)
Anesthesia Post Note  Patient: Justin Suarez  Procedure(s) Performed: UPPER ENDOSCOPIC ULTRASOUND (EUS) RADIAL (N/A ) ENDOSCOPIC RETROGRADE CHOLANGIOPANCREATOGRAPHY (ERCP) WITH PROPOFOL (N/A )     Patient location during evaluation: PACU Anesthesia Type: MAC Level of consciousness: awake and alert Pain management: pain level controlled Vital Signs Assessment: post-procedure vital signs reviewed and stable Respiratory status: spontaneous breathing, nonlabored ventilation, respiratory function stable and patient connected to nasal cannula oxygen Cardiovascular status: stable and blood pressure returned to baseline Postop Assessment: no apparent nausea or vomiting Anesthetic complications: no   No complications documented.  Last Vitals:  Vitals:   04/01/20 1120 04/01/20 1121  BP: (!) 173/77   Pulse:  75  Resp:  18  Temp:    SpO2:  100%    Last Pain:  Vitals:   04/01/20 1101  TempSrc: Oral  PainSc: 2                  Hawk Mones

## 2020-04-01 NOTE — Transfer of Care (Signed)
Immediate Anesthesia Transfer of Care Note  Patient: Justin Suarez  Procedure(s) Performed: UPPER ENDOSCOPIC ULTRASOUND (EUS) RADIAL (N/A ) ENDOSCOPIC RETROGRADE CHOLANGIOPANCREATOGRAPHY (ERCP) WITH PROPOFOL (N/A )  Patient Location: PACU and Endoscopy Unit  Anesthesia Type:MAC  Level of Consciousness: awake, alert  and patient cooperative  Airway & Oxygen Therapy: Patient Spontanous Breathing and Patient connected to face mask oxygen  Post-op Assessment: Report given to RN and Post -op Vital signs reviewed and stable  Post vital signs: Reviewed and stable  Last Vitals:  Vitals Value Taken Time  BP 138/77 04/01/20 1059  Temp    Pulse 89 04/01/20 1059  Resp 20 04/01/20 1059  SpO2 100 % 04/01/20 1059  Vitals shown include unvalidated device data.  Last Pain:  Vitals:   04/01/20 0820  TempSrc: Oral  PainSc: 2          Complications: No complications documented.

## 2020-04-01 NOTE — Anesthesia Procedure Notes (Addendum)
Procedure Name: MAC Date/Time: 04/01/2020 10:26 AM Performed by: West Pugh, CRNA Pre-anesthesia Checklist: Patient identified, Emergency Drugs available, Suction available, Patient being monitored and Timeout performed Patient Re-evaluated:Patient Re-evaluated prior to induction Oxygen Delivery Method: Simple face mask Preoxygenation: Pre-oxygenation with 100% oxygen Induction Type: IV induction Placement Confirmation: positive ETCO2 Dental Injury: Teeth and Oropharynx as per pre-operative assessment

## 2020-04-01 NOTE — Telephone Encounter (Signed)
-----   Message from Irene Shipper, MD sent at 04/01/2020 12:14 PM EST ----- Linna Hoff, Thank you! JP  Desmund Elman, 1.  Put in MRI of the pancreas recall for 2 years 2.  Please refer to [facility] surgery "biliary colic, for laparoscopic cholecystectomy" Thank you, JP  ----- Message ----- From: Milus Banister, MD Sent: 04/01/2020  11:44 AM EST To: Irene Shipper, MD  John, See full EUS report in Epic. His symptoms really seem like they are GB related and I think referral to [facility] to consider lap chole is a good idea, they may want a HIDA scan to boost the case for surgery.  Thanks     - Findings consistent with chronic pancreatitis, probably etoh related given his former heavy alcohol abuse (a case of beer daily for many years, much less lately).  The changes of chronic pancreatitis derease the ability of imaging tests (including this EUS) to detect neoplastic growths. - Cystic lesion in uncinate pancreas without concerning morophologic features on this exam or recent MRI. This is probably a pseudocyst, I recommend repeat MRI in 2 years to ensure stability. - No gallstones or CBD stones but his gallbladder is thickwalled.  His symptoms are fairly classic for biliary colic.

## 2020-04-02 ENCOUNTER — Encounter (HOSPITAL_COMMUNITY): Payer: Self-pay | Admitting: Gastroenterology

## 2020-04-04 LAB — POCT I-STAT, CHEM 8
BUN: 3 mg/dL — ABNORMAL LOW (ref 8–23)
Calcium, Ion: 1.11 mmol/L — ABNORMAL LOW (ref 1.15–1.40)
Chloride: 97 mmol/L — ABNORMAL LOW (ref 98–111)
Creatinine, Ser: 0.7 mg/dL (ref 0.61–1.24)
Glucose, Bld: 98 mg/dL (ref 70–99)
HCT: 45 % (ref 39.0–52.0)
Hemoglobin: 15.3 g/dL (ref 13.0–17.0)
Potassium: 4.5 mmol/L (ref 3.5–5.1)
Sodium: 134 mmol/L — ABNORMAL LOW (ref 135–145)
TCO2: 26 mmol/L (ref 22–32)

## 2020-04-21 ENCOUNTER — Ambulatory Visit: Payer: Self-pay | Admitting: General Surgery

## 2020-04-21 NOTE — H&P (Signed)
[name] Appointment: 04/21/2020 11:30 AM Location: [facility] Patient #: [id number] DOB: 03-Jul-1955 Single / Language: [name] / Race: White Male  History of Present Illness Justin Hiss M. Deontez Klinke MD; 04/21/2020 12:03 PM) The patient is a 65 year old male who presents for evaluation of gallbladder disease. He is referred by Dr. Ardis Hughs and Dr. Henrene Pastor for evaluation of gallbladder problems. He went to his primary care physician complaining of intermittent episodes of right upper quadrant pain radiating to his back. This was evaluated with ultrasound done at [facility] He had no evidence of gallstones but he did have thick sludge some hepatic steatosis and a common bile duct of 9 mm. This was further evaluated by an MRI which I reviewed. Again he was found to have a dilated duct that tapered and a 1.6 x 1.4 cm inferior pancreatic head/uncinate process lesion. He was referred GI medicine. He underwent EUS by Dr. Ardis Hughs on February 10. His endoscopy of his foregut was unremarkable. Normal esophagus and normal stomach and normal duodenum. EUS showed a thickened gallbladder wall without stones. He also findings consistent with chronic pancreatitis. He had a 1.5 cm septic cystic lesion and subsequently process.  He reports abdominal pain starting in September. No particular pattern. Generally it is a 2 out of 10. But at times it'll flare and be 10 out of 10. He points to his right upper quadrant. It'll radiate to his back. It lasts for about 2 days however it will not be that intents of the 10 out of 10 that entire time. Nausea and vomiting with it. He doesn't have an appetite during these flares. No fever or chills. He has lost about 25 pounds. He does drink a couple of beers per day. No withdrawal of the past. He is retired. He does smoke.  Class 2-3 days. He had a remote history of a DVT and PE in 2013. He is currently not on any anticoagulation. He denies any family  history of blood clots. No melena, hematochezia or hematemesis. No acholic stools. He has had a prior open right inguinal hernia repair.  He takes medicine for hypertension. The pain in the upper abdomen is controlled with some pain medication that his PCP gave him   Problem List/Past Medical Justin Hiss M. 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  Diagnostic Studies History Janeann Forehand, CNA; 04/21/2020 10:53 AM) Colonoscopy never  Allergies Janeann Forehand, CNA; 04/21/2020 10:53 AM) No Known Drug Allergies [04/21/2020]: Allergies Reconciled  Medication History Janeann Forehand, CNA; 04/21/2020 10:53 AM) HYDROcodone-Acetaminophen (5-325MG  Tablet, Oral) Active. amLODIPine Besylate (5MG  Tablet, Oral) Active. Lisinopril (10MG  Tablet, Oral) Active. Meloxicam (15MG  Tablet, Oral) Active. Ondansetron HCl (4MG  Tablet, Oral) Active. Lisinopril (40MG  Tablet, Oral) Active. Medications Reconciled  Social History Janeann Forehand, CNA; 04/21/2020 10:53 AM) Alcohol use Moderate alcohol use. Caffeine use Coffee, Tea. Illicit drug use Remotely quit drug use. Tobacco use Current every day smoker.  Family History Janeann Forehand, CNA; 04/21/2020 10:53 AM) Alcohol Abuse Father. Heart Disease Brother, Mother, Sister. Hypertension Brother, Mother, Sister. Kidney Disease Mother.  Other Problems Justin Hiss M. Redmond Pulling, MD; 04/21/2020 12:03 PM) Alcohol Abuse Arthritis Back Pain Hemorrhoids High blood pressure     Review of Systems Justin Hiss M. Cashius Grandstaff MD; 04/21/2020 11:58 AM) General Present- Appetite Loss, Night Sweats and Weight Loss. Not Present- Chills, Fatigue, Fever  and Weight Gain. Skin Present- Dryness. Not Present- Change in Wart/Mole,  Hives, Jaundice, New Lesions, Non-Healing Wounds, Rash and Ulcer. HEENT Present- Ringing in the Ears and Wears glasses/contact lenses. Not Present- Earache, Hearing Loss, Hoarseness, Nose Bleed, Oral Ulcers, Seasonal Allergies, Sinus Pain, Sore Throat, Visual Disturbances and Yellow Eyes. Respiratory Present- Difficulty Breathing and Snoring. Not Present- Bloody sputum, Chronic Cough and Wheezing. Cardiovascular Present- Difficulty Breathing Lying Down, Leg Cramps, Shortness of Breath and Swelling of Extremities. Not Present- Chest Pain, Palpitations and Rapid Heart Rate. Gastrointestinal Present- Abdominal Pain, Bloating, Change in Bowel Habits, Excessive gas, Hemorrhoids, Nausea and Vomiting. Not Present- Bloody Stool, Chronic diarrhea, Constipation, Difficulty Swallowing, Gets full quickly at meals, Indigestion and Rectal Pain. Male Genitourinary Not Present- Blood in Urine, Change in Urinary Stream, Frequency, Impotence, Nocturia, Painful Urination, Urgency and Urine Leakage. All other systems negative  Vitals (Donyelle Alston CNA; 04/21/2020 10:54 AM) 04/21/2020 10:53 AM Weight: 178.38 lb Height: 71in Body Surface Area: 2.01 m Body Mass Index: 24.88 kg/m  Temp.: 97.22F  Pulse: 103 (Regular)  P.OX: 97% (Room air) BP: 140/70(Sitting, Left Arm, Standard)        Physical Exam Justin Hiss M. Kenleigh Toback MD; 04/21/2020 11:58 AM)  General Mental Status-Alert. General Appearance-Consistent with stated age. Hydration-Well hydrated. Voice-Normal.  Head and Neck Head-normocephalic, atraumatic with no lesions or palpable masses. Trachea-midline. Thyroid Gland Characteristics - normal size and consistency.  Eye Eyeball - Bilateral-Extraocular movements intact. Sclera/Conjunctiva - Bilateral-No scleral icterus.  Chest and Lung Exam Chest and lung exam reveals -quiet, even and easy respiratory effort with no use of accessory muscles and on auscultation, normal breath  sounds, no adventitious sounds and normal vocal resonance. Inspection Chest Wall - Normal. Back - normal.  Breast - Did not examine.  Cardiovascular Cardiovascular examination reveals -normal heart sounds, regular rate and rhythm with no murmurs and normal pedal pulses bilaterally.  Abdomen Inspection Inspection of the abdomen reveals - No Hernias. Skin - Scar - no surgical scars. Palpation/Percussion Palpation and Percussion of the abdomen reveal - Soft, Non Tender, No Rebound tenderness, No Rigidity (guarding) and No hepatosplenomegaly. Auscultation Auscultation of the abdomen reveals - Bowel sounds normal.  Peripheral Vascular Upper Extremity Palpation - Pulses bilaterally normal.  Neurologic Neurologic evaluation reveals -alert and oriented x 3 with no impairment of recent or remote memory. Mental Status-Normal.  Neuropsychiatric The patient's mood and affect are described as -normal. Judgment and Insight-insight is appropriate concerning matters relevant to self.  Musculoskeletal Normal Exam - Left-Upper Extremity Strength Normal and Lower Extremity Strength Normal. Normal Exam - Right-Upper Extremity Strength Normal and Lower Extremity Strength Normal.  Lymphatic Head & Neck  General Head & Neck Lymphatics: Bilateral - Description - Normal. Axillary - Did not examine. Femoral & Inguinal - Did not examine.    Assessment & Plan Justin Hiss M. Shine Mikes MD; 02/23/4313 40:08 AM)  COLICKY RUQ ABDOMINAL PAIN (R10.11) Impression: I believe the majority of the patient's symptoms are consistent with gallbladder disease. even though he doesn't have gallstones on imaging he has thick sludge and fairly biliary colic symptoms.  We discussed gallbladder disease. The patient was given Neurosurgeon. We discussed non-operative and operative management. We discussed the signs & symptoms of acute cholecystitis  I discussed laparoscopic cholecystectomy with IOC in  detail. The patient was given educational material as well as diagrams detailing the procedure. We discussed the risks and benefits of a laparoscopic cholecystectomy including, but not limited to bleeding, infection, injury to surrounding structures such as the intestine or  liver, bile leak, retained gallstones, need to convert to an open procedure, prolonged diarrhea, blood clots such as DVT, common bile duct injury, anesthesia risks, and possible need for additional procedures. We discussed the typical post-operative recovery course. I explained that the likelihood of improvement of their symptoms is fair to good.  The patient has elected to proceed with surgery.  This patient encounter took 45 minutes today to perform the following: take history, perform exam, review outside records, interpret imaging, counsel the patient on their diagnosis and document encounter, findings & plan in the EHR  Current Plans Pt Education - Pamphlet Given - Laparoscopic Gallbladder Surgery: discussed with patient and provided information. You are being scheduled for surgery- Our schedulers will call you.  You should hear from our office's scheduling department within 5 working days about the location, date, and time of surgery. We try to make accommodations for patient's preferences in scheduling surgery, but sometimes the OR schedule or the surgeon's schedule prevents [place] from making those accommodations.  If you have not heard from our office [phone]) in 5 working days, call the office and ask for your surgeon's nurse.  If you have other questions about your diagnosis, plan, or surgery, call the office and ask for your surgeon's nurse.   BILIARY SLUDGE (K83.8)   CHRONIC CALCIFIC PANCREATITIS (K86.1) Impression: we did discuss that some of his chronic pancreatitis as seen on EUS could be contributing to some of his abdominal compliants   PANCREATIC CYST (K86.2)   HISTORY OF PULMONARY EMBOLISM  (Z86.711) Impression: We did discuss because of his remote history of a PE and DVT that does place him at increased risk for recurrence. We discussed our steps to try to minimize that. He will wear SCDs during surgery and he will receive preoperative prophylactic Lovenox  Leighton Ruff. Redmond Pulling, MD, FACS General, Bariatric, & Minimally Invasive Surgery [facility], [place]

## 2020-05-17 NOTE — Progress Notes (Addendum)
COVID Vaccine Completed:  No Date COVID Vaccine completed: Has received booster: COVID vaccine manufacturer: [employer]   Date of COVID positive in last 90 days:  03-15-20 Results in Epic  PCP - Cyndi Bender, PA-C Cardiologist - Kirk Ruths, MD  Chest x-ray - 02-04-20 Epic  EKG - 05-20-20 Epic Stress Test - 2013  ECHO - 2013 Cardiac Cath - ?2013 Pacemaker/ICD device last checked: Spinal Cord Stimulator: Cardiac monitor - 2013  Sleep Study - N/A CPAP -   Fasting Blood Sugar - N/A Checks Blood Sugar _____ times a day  Blood Thinner Instructions: N/A Aspirin Instructions: Last Dose:  Activity level:  Can go up a flight of stairs and perform activities of daily living without stopping and without symptoms of chest pain or shortness of breath.   Anesthesia review: Hx of chest pain & SOB 5+ years ago.  Hx of PE.   Patient denies shortness of breath, fever, cough and chest pain at PAT appointment (interview completed via telephone)   Patient verbalized understanding of instructions that were given to them at the PAT appointment. Patient was also instructed that they will need to review over the PAT instructions again at home before surgery.

## 2020-05-17 NOTE — Patient Instructions (Addendum)
DUE TO COVID-19 ONLY ONE VISITOR IS ALLOWED TO COME WITH YOU AND STAY IN THE WAITING ROOM ONLY DURING PRE OP AND PROCEDURE.         Your procedure is scheduled on:  Monday, 05-24-20   Report to [facility] Main  Entrance    Report to admitting at 9:30 AM   Call this number if you have problems the morning of surgery [phone]   Do not eat food :After Midnight.   May have liquids until 8:30 AM day of surgery  CLEAR LIQUID DIET  Foods Allowed                                                                     Foods Excluded  Water, Black Coffee and tea, regular and decaf              liquids that you cannot  Plain Jell-O in any flavor  (No red)                                    see through such as: Fruit ices (not with fruit pulp)                                      milk, soups, orange juice              Iced Popsicles (No red)                                      All solid food                                   Apple juices Sports drinks like Gatorade (No red) Lightly seasoned clear broth or consume(fat free) Sugar, honey syrup     Complete one Ensure drink the morning of surgery at 8:30 AM the day of surgery.     1. The day of surgery:  ? Drink ONE (1) Pre-Surgery Clear Ensure or G2 by am the morning of surgery. Drink in one sitting. Do not sip.  ? This drink was given to you during your hospital  pre-op appointment visit. ? Nothing else to drink after completing the  Pre-Surgery Clear Ensure or G2.          If you have questions, please contact your surgeon's office.     Oral Hygiene is also important to reduce your risk of infection.                                    Remember - BRUSH YOUR TEETH THE MORNING OF SURGERY WITH YOUR REGULAR TOOTHPASTE   Do NOT smoke after Midnight   Take these medicines the morning of surgery with A SIP OF WATER: Amlodipine  You may not have any metal on your body including jewelry, and body  piercings             Do not wear lotions, powders, perfumes/cologne, or deodorant             Men may shave face and neck.   Do not bring valuables to the hospital. [place].   Contacts, dentures or bridgework may not be worn into surgery.   Patients discharged the day of surgery will not be allowed to drive home.   Special Instructions: Bring a copy of your healthcare power of attorney and living will documents         the day of surgery if you haven't  scanned them in before.              Please read over the following fact sheets you were given: IF YOU HAVE QUESTIONS ABOUT YOUR PRE OP INSTRUCTIONS PLEASE CALL  [facility] - Preparing for Surgery Before surgery, you can play an important role.  Because skin is not sterile, your skin needs to be as free of germs as possible.  You can reduce the number of germs on your skin by washing with CHG (chlorahexidine gluconate) soap before surgery.  CHG is an antiseptic cleaner which kills germs and bonds with the skin to continue killing germs even after washing. Please DO NOT use if you have an allergy to CHG or antibacterial soaps.  If your skin becomes reddened/irritated stop using the CHG and inform your nurse when you arrive at Short Stay. Do not shave (including legs and underarms) for at least 48 hours prior to the first CHG shower.  You may shave your face/neck.  Please follow these instructions carefully:  1.  Shower with CHG Soap the night before surgery and the  morning of surgery.  2.  If you choose to wash your hair, wash your hair first as usual with your normal  shampoo.  3.  After you shampoo, rinse your hair and body thoroughly to remove the shampoo.                             4.  Use CHG as you would any other liquid soap.  You can apply chg directly to the skin and wash.  Gently with a scrungie or clean washcloth.  5.  Apply the CHG Soap to your body ONLY  FROM THE NECK DOWN.   Do   not use on face/ open                           Wound or open sores. Avoid contact with eyes, ears mouth and   genitals (private parts).                       Wash face,  Genitals (private parts) with your normal soap.             6.  Wash thoroughly, paying special attention to the area where your    surgery  will be performed.  7.  Thoroughly rinse your body with warm water from the neck down.  8.  DO NOT shower/wash with your normal soap after using and rinsing off the CHG Soap.  9.  Pat yourself dry with a clean towel.            10.  Wear clean pajamas.            11.  Place clean sheets on your bed the night of your first shower and do not  sleep with pets. Day of Surgery : Do not apply any lotions/deodorants the morning of surgery.  Please wear clean clothes to the hospital/surgery center.  FAILURE TO FOLLOW THESE INSTRUCTIONS MAY RESULT IN THE CANCELLATION OF YOUR SURGERY  PATIENT SIGNATURE_________________________________  NURSE SIGNATURE__________________________________  ________________________________________________________________________

## 2020-05-18 ENCOUNTER — Encounter (HOSPITAL_COMMUNITY): Payer: Self-pay

## 2020-05-18 ENCOUNTER — Other Ambulatory Visit: Payer: Self-pay

## 2020-05-18 ENCOUNTER — Encounter (HOSPITAL_COMMUNITY)
Admission: RE | Admit: 2020-05-18 | Discharge: 2020-05-18 | Disposition: A | Discharge status: Home / Self Care | Payer: Medicare Other | Source: Ambulatory Visit | Attending: General Surgery | Admitting: General Surgery

## 2020-05-18 HISTORY — DX: Gastro-esophageal reflux disease without esophagitis: K21.9

## 2020-05-18 HISTORY — DX: Personal history of other diseases of the digestive system: Z87.19

## 2020-05-18 HISTORY — DX: Unspecified osteoarthritis, unspecified site: M19.90

## 2020-05-20 ENCOUNTER — Other Ambulatory Visit (HOSPITAL_COMMUNITY): Payer: Medicare Other

## 2020-05-20 ENCOUNTER — Encounter (HOSPITAL_COMMUNITY)
Admission: RE | Admit: 2020-05-20 | Discharge: 2020-05-20 | Disposition: A | Discharge status: Home / Self Care | Payer: Medicare Other | Source: Ambulatory Visit | Attending: General Surgery | Admitting: General Surgery

## 2020-05-20 ENCOUNTER — Other Ambulatory Visit: Payer: Self-pay

## 2020-05-20 DIAGNOSIS — K859 Acute pancreatitis without necrosis or infection, unspecified: Secondary | ICD-10-CM | POA: Diagnosis not present

## 2020-05-20 DIAGNOSIS — Z01818 Encounter for other preprocedural examination: Secondary | ICD-10-CM | POA: Insufficient documentation

## 2020-05-20 DIAGNOSIS — K851 Biliary acute pancreatitis without necrosis or infection: Secondary | ICD-10-CM | POA: Diagnosis not present

## 2020-05-20 LAB — CBC WITH DIFFERENTIAL/PLATELET
Abs Immature Granulocytes: 0.02 10*3/uL (ref 0.00–0.07)
Basophils Absolute: 0 10*3/uL (ref 0.0–0.1)
Basophils Relative: 1 %
Eosinophils Absolute: 0.1 10*3/uL (ref 0.0–0.5)
Eosinophils Relative: 2 %
HCT: 43.5 % (ref 39.0–52.0)
Hemoglobin: 14.8 g/dL (ref 13.0–17.0)
Immature Granulocytes: 0 %
Lymphocytes Relative: 34 %
Lymphs Abs: 2.1 10*3/uL (ref 0.7–4.0)
MCH: 34.7 pg — ABNORMAL HIGH (ref 26.0–34.0)
MCHC: 34 g/dL (ref 30.0–36.0)
MCV: 102.1 fL — ABNORMAL HIGH (ref 80.0–100.0)
Monocytes Absolute: 0.5 10*3/uL (ref 0.1–1.0)
Monocytes Relative: 9 %
Neutro Abs: 3.2 10*3/uL (ref 1.7–7.7)
Neutrophils Relative %: 54 %
Platelets: 365 10*3/uL (ref 150–400)
RBC: 4.26 MIL/uL (ref 4.22–5.81)
RDW: 12.7 % (ref 11.5–15.5)
WBC: 6 10*3/uL (ref 4.0–10.5)
nRBC: 0 % (ref 0.0–0.2)

## 2020-05-20 LAB — COMPREHENSIVE METABOLIC PANEL
ALT: 16 U/L (ref 0–44)
AST: 23 U/L (ref 15–41)
Albumin: 3.7 g/dL (ref 3.5–5.0)
Alkaline Phosphatase: 73 U/L (ref 38–126)
Anion gap: 6 (ref 5–15)
BUN: 7 mg/dL — ABNORMAL LOW (ref 8–23)
CO2: 28 mmol/L (ref 22–32)
Calcium: 8.9 mg/dL (ref 8.9–10.3)
Chloride: 101 mmol/L (ref 98–111)
Creatinine, Ser: 0.71 mg/dL (ref 0.61–1.24)
GFR, Estimated: >60 mL/min (ref >60)
Glucose, Bld: 113 mg/dL — ABNORMAL HIGH (ref 70–99)
Potassium: 4.3 mmol/L (ref 3.5–5.1)
Sodium: 135 mmol/L (ref 135–145)
Total Bilirubin: 0.7 mg/dL (ref 0.3–1.2)
Total Protein: 7.2 g/dL (ref 6.5–8.1)

## 2020-05-21 ENCOUNTER — Encounter (HOSPITAL_COMMUNITY): Payer: Self-pay | Admitting: Emergency Medicine

## 2020-05-21 ENCOUNTER — Emergency Department (HOSPITAL_COMMUNITY): Payer: Medicare Other

## 2020-05-21 ENCOUNTER — Other Ambulatory Visit: Payer: Self-pay

## 2020-05-21 ENCOUNTER — Inpatient Hospital Stay (HOSPITAL_COMMUNITY)
Admission: EM | Admit: 2020-05-21 | Discharge: 2020-05-25 | DRG: 418 | Disposition: A | Discharge status: Home / Self Care | Payer: Medicare Other | Attending: Internal Medicine | Admitting: Internal Medicine

## 2020-05-21 DIAGNOSIS — Z86711 Personal history of pulmonary embolism: Secondary | ICD-10-CM

## 2020-05-21 DIAGNOSIS — Z419 Encounter for procedure for purposes other than remedying health state, unspecified: Secondary | ICD-10-CM

## 2020-05-21 DIAGNOSIS — I10 Essential (primary) hypertension: Secondary | ICD-10-CM

## 2020-05-21 DIAGNOSIS — R1011 Right upper quadrant pain: Secondary | ICD-10-CM

## 2020-05-21 DIAGNOSIS — K863 Pseudocyst of pancreas: Secondary | ICD-10-CM | POA: Diagnosis present

## 2020-05-21 DIAGNOSIS — K08109 Complete loss of teeth, unspecified cause, unspecified class: Secondary | ICD-10-CM | POA: Diagnosis present

## 2020-05-21 DIAGNOSIS — K219 Gastro-esophageal reflux disease without esophagitis: Secondary | ICD-10-CM | POA: Diagnosis present

## 2020-05-21 DIAGNOSIS — E861 Hypovolemia: Secondary | ICD-10-CM | POA: Diagnosis present

## 2020-05-21 DIAGNOSIS — Z635 Disruption of family by separation and divorce: Secondary | ICD-10-CM

## 2020-05-21 DIAGNOSIS — K859 Acute pancreatitis without necrosis or infection, unspecified: Secondary | ICD-10-CM | POA: Diagnosis present

## 2020-05-21 DIAGNOSIS — J449 Chronic obstructive pulmonary disease, unspecified: Secondary | ICD-10-CM | POA: Diagnosis present

## 2020-05-21 DIAGNOSIS — M19041 Primary osteoarthritis, right hand: Secondary | ICD-10-CM | POA: Diagnosis present

## 2020-05-21 DIAGNOSIS — Z8616 Personal history of COVID-19: Secondary | ICD-10-CM

## 2020-05-21 DIAGNOSIS — Z86718 Personal history of other venous thrombosis and embolism: Secondary | ICD-10-CM

## 2020-05-21 DIAGNOSIS — Z8249 Family history of ischemic heart disease and other diseases of the circulatory system: Secondary | ICD-10-CM

## 2020-05-21 DIAGNOSIS — K8000 Calculus of gallbladder with acute cholecystitis without obstruction: Secondary | ICD-10-CM | POA: Diagnosis present

## 2020-05-21 DIAGNOSIS — Z79899 Other long term (current) drug therapy: Secondary | ICD-10-CM

## 2020-05-21 DIAGNOSIS — E871 Hypo-osmolality and hyponatremia: Secondary | ICD-10-CM

## 2020-05-21 DIAGNOSIS — E860 Dehydration: Secondary | ICD-10-CM

## 2020-05-21 DIAGNOSIS — K851 Biliary acute pancreatitis without necrosis or infection: Principal | ICD-10-CM

## 2020-05-21 DIAGNOSIS — Z20822 Contact with and (suspected) exposure to covid-19: Secondary | ICD-10-CM | POA: Diagnosis present

## 2020-05-21 DIAGNOSIS — M19042 Primary osteoarthritis, left hand: Secondary | ICD-10-CM | POA: Diagnosis present

## 2020-05-21 DIAGNOSIS — F17210 Nicotine dependence, cigarettes, uncomplicated: Secondary | ICD-10-CM | POA: Diagnosis present

## 2020-05-21 DIAGNOSIS — K8020 Calculus of gallbladder without cholecystitis without obstruction: Secondary | ICD-10-CM

## 2020-05-21 LAB — COMPREHENSIVE METABOLIC PANEL
ALT: 20 U/L (ref 0–44)
AST: 28 U/L (ref 15–41)
Albumin: 3.9 g/dL (ref 3.5–5.0)
Alkaline Phosphatase: 65 U/L (ref 38–126)
Anion gap: 12 (ref 5–15)
BUN: 8 mg/dL (ref 8–23)
CO2: 23 mmol/L (ref 22–32)
Calcium: 8.6 mg/dL — ABNORMAL LOW (ref 8.9–10.3)
Chloride: 95 mmol/L — ABNORMAL LOW (ref 98–111)
Creatinine, Ser: 0.87 mg/dL (ref 0.61–1.24)
GFR, Estimated: >60 mL/min (ref >60)
Glucose, Bld: 104 mg/dL — ABNORMAL HIGH (ref 70–99)
Potassium: 3.8 mmol/L (ref 3.5–5.1)
Sodium: 130 mmol/L — ABNORMAL LOW (ref 135–145)
Total Bilirubin: 0.7 mg/dL (ref 0.3–1.2)
Total Protein: 7.2 g/dL (ref 6.5–8.1)

## 2020-05-21 LAB — CBC
HCT: 39 % (ref 39.0–52.0)
Hemoglobin: 13.7 g/dL (ref 13.0–17.0)
MCH: 34.8 pg — ABNORMAL HIGH (ref 26.0–34.0)
MCHC: 35.1 g/dL (ref 30.0–36.0)
MCV: 99 fL (ref 80.0–100.0)
Platelets: 340 10*3/uL (ref 150–400)
RBC: 3.94 MIL/uL — ABNORMAL LOW (ref 4.22–5.81)
RDW: 12.4 % (ref 11.5–15.5)
WBC: 7.8 10*3/uL (ref 4.0–10.5)
nRBC: 0 % (ref 0.0–0.2)

## 2020-05-21 LAB — URINALYSIS, ROUTINE W REFLEX MICROSCOPIC
Bilirubin Urine: NEGATIVE
Glucose, UA: NEGATIVE mg/dL
Hgb urine dipstick: NEGATIVE
Ketones, ur: NEGATIVE mg/dL
Leukocytes,Ua: NEGATIVE
Nitrite: NEGATIVE
Protein, ur: NEGATIVE mg/dL
Specific Gravity, Urine: 1.006 (ref 1.005–1.030)
pH: 5 (ref 5.0–8.0)

## 2020-05-21 LAB — LIPASE, BLOOD: Lipase: 2206 U/L — ABNORMAL HIGH (ref 11–51)

## 2020-05-21 MED ORDER — ONDANSETRON HCL 4 MG/2ML IJ SOLN
4.0000 mg | Freq: Once | INTRAMUSCULAR | Status: AC
  Administered 2020-05-21: 4 mg via INTRAVENOUS
  Filled 2020-05-21: qty 2

## 2020-05-21 MED ORDER — MORPHINE SULFATE (PF) 4 MG/ML IV SOLN
4.0000 mg | Freq: Once | INTRAVENOUS | Status: AC
Start: 2020-05-21 — End: 2020-05-21
  Administered 2020-05-21: 4 mg via INTRAVENOUS
  Filled 2020-05-21: qty 1

## 2020-05-21 NOTE — ED Triage Notes (Signed)
Patient complaining of upper right quadrant abdominal pain. Patient has a hx of gallstones. Patient is scheduled for gallstone surgery.

## 2020-05-21 NOTE — ED Provider Notes (Addendum)
Patient seen in triage for MSE.  Chief Complaint: RUQ pain, N/V  HPI:   Scheduled for cholecystectomy on Monday 05/24/20, N/V yesterday, uncontrolled pain today.   ROS: RUQ pain, N/V, SOB NEGATIVE CP, fevers, chills  Physical Exam:   Gen: No distress  Neuro: Awake and Alert  Skin: Warm    Focused Exam: cardiopulmonary exam is normal, RUQ and epigastricTTP  Initiation of care has begun. The patient has been counseled on the process, plan, and necessity for staying for the completion/evaluation, and the remainder of the medical screening examination  PATIENT TESTED COVID+ in January 2022.  MSE was initiated and I personally evaluated the patient and placed orders (if any) at  9:46 PM on May 21, 2020.  The patient appears stable so that the remainder of the MSE may be completed by another provider.   Emeline Darling, PA-C 05/21/20 2150    Rabecka Brendel, Gypsy Balsam, PA-C 05/21/20 2151    Lacretia Leigh, MD 05/24/20 [phone]

## 2020-05-21 NOTE — ED Provider Notes (Signed)
[facility] DEPT Provider Note   CSN: [id number] Arrival date & time: 05/21/20  2110     History Chief Complaint  Patient presents with  . Abdominal Pain    Justin Suarez is a 65 y.o. male.  The history is provided by the patient and medical records.   65 y.o. M with hx of arthritis, HTN, tobacco abuse, GERD, remote DVT and PE not currently on anticoagulation, presenting to the ED for abdominal pain.  Patient reports he is scheduled to have his gallbladder removed on Monday for 05/24/2020.  States he has actually had a rough week-- unable to eat much of anything without vomiting.  He actually woke up fine this morning and did his usual chores, mowed the grass, etc.  States around 4:30 PM he began having severe right upper quadrant pain.  States constant, dull ache with intermittent sharp, stabbing sensations radiating to the back.  He denies any nausea or vomiting.  He has not had any fever or chills.  No chest pain or shortness of breath.  He did take hydrocodone prior to arrival.  Of note, patient tested + for covid 03/15/20 and was told not to repeat test for 90 days.  No recent cough, fever, SOB, or other URI symptoms.  Past Medical History:  Diagnosis Date  . Abnormal CT scan, chest May 2013   multiple lung nodules; needs repeat study in 3 to 12 months.  . Arthritis    Hands  . Chronic anticoagulation   . Common bile duct dilation   . COPD (chronic obstructive pulmonary disease) ([place])    Pt denies  . DVT (deep venous thrombosis) ([place])   . GERD (gastroesophageal reflux disease)   . History of hiatal hernia   . HTN (hypertension)   . Noncompliance   . Pulmonary embolus ([place])   . Tick bite June 2013  . Tobacco abuse     Patient Active Problem List   Diagnosis Date Noted  . Tick bite of abdomen 08/07/2011  . Chest pain 08/07/2011  . Elevated blood pressure 08/07/2011  . COPD (chronic obstructive pulmonary disease) ([place]) 04/13/2011  .  Pulmonary embolus ([place]) 03/15/2011  . Tobacco abuse 03/15/2011  . Long term (current) use of anticoagulants 03/15/2011  . DVT, lower extremity ([place]) 03/15/2011    Past Surgical History:  Procedure Laterality Date  . ANKLE SURGERY Right    x 1 from a fall  . ESOPHAGOGASTRODUODENOSCOPY (EGD) WITH PROPOFOL N/A 04/01/2020   Procedure: ESOPHAGOGASTRODUODENOSCOPY (EGD) WITH PROPOFOL;  Surgeon: Milus Banister, MD;  Location: [facility] END[facility]COPY;  Service: Endoscopy;  Laterality: N/A;  . EUS N/A 04/01/2020   Procedure: UPPER ENDOSCOPIC ULTRASOUND (EUS) RADIAL;  Surgeon: Milus Banister, MD;  Location: [facility];  Service: Endoscopy;  Laterality: N/A;  . FOOT SURGERY Right    x 3 from a fall  . INGUINAL HERNIA REPAIR Right        Family History  Problem Relation Age of Onset  . Heart disease Mother        CHF  . Heart failure Mother   . Coronary artery disease Sister   . Heart disease Sister   . Coronary artery disease Sister   . Heart disease Sister   . Heart disease Father   . Atrial fibrillation Brother   . Esophageal cancer Neg Hx   . Colon cancer Neg Hx   . Rectal cancer Neg Hx     Social History   Tobacco Use  .  Smoking status: Current Every Day Smoker    Packs/day: 1.00    Years: 43.00    Pack years: 43.00    Types: Cigarettes  . Smokeless tobacco: Never Used  Vaping Use  . Vaping Use: Never used  Substance Use Topics  . Alcohol use: Yes    Comment: Occasional. 4-5 beers 12 oz./week.   . Drug use: No    Home Medications Prior to Admission medications   Medication Sig Start Date End Date Taking? Authorizing Provider  amLODipine (NORVASC) 5 MG tablet Take 5 mg by mouth daily. 03/24/20   [provider]  ondansetron (ZOFRAN) 4 MG tablet Take 4 mg by mouth 3 (three) times daily as needed for vomiting or nausea. 03/18/20   [provider]    Allergies    Patient has no known allergies.  Review of Systems   Review of Systems  Gastrointestinal:  Positive for abdominal pain.  All other systems reviewed and are negative.   Physical Exam Updated Vital Signs BP (!) 163/87 (BP Location: Left Arm)   Pulse 88   Temp 97.9 F (36.6 C) (Oral)   Resp 19   Ht $R'6\' 1"'FQ$  (1.854 m)   Wt 80.3 kg   SpO2 97%   BMI 23.35 kg/m   Physical Exam Vitals and nursing note reviewed.  Constitutional:      Appearance: He is well-developed.     Comments: Uncomfortable appearing  HENT:     Head: Normocephalic and atraumatic.  Eyes:     Conjunctiva/sclera: Conjunctivae normal.     Pupils: Pupils are equal, round, and reactive to light.  Cardiovascular:     Rate and Rhythm: Normal rate and regular rhythm.     Heart sounds: Normal heart sounds.  Pulmonary:     Effort: Pulmonary effort is normal.     Breath sounds: Normal breath sounds.  Abdominal:     General: Bowel sounds are normal.     Palpations: Abdomen is soft.     Tenderness: There is abdominal tenderness in the right upper quadrant. There is guarding (voluntary). Positive signs include Murphy's sign.  Musculoskeletal:        General: Normal range of motion.     Cervical back: Normal range of motion.  Skin:    General: Skin is warm and dry.  Neurological:     Mental Status: He is alert and oriented to person, place, and time.     ED Results / Procedures / Treatments   Labs (all labs ordered are listed, but only abnormal results are displayed) Labs Reviewed  LIPASE, BLOOD - Abnormal; Notable for the following components:      Result Value   Lipase 2,206 (*)    All other components within normal limits  COMPREHENSIVE METABOLIC PANEL - Abnormal; Notable for the following components:   Sodium 130 (*)    Chloride 95 (*)    Glucose, Bld 104 (*)    Calcium 8.6 (*)    All other components within normal limits  CBC - Abnormal; Notable for the following components:   RBC 3.94 (*)    MCH 34.8 (*)    All other components within normal limits  URINALYSIS, ROUTINE W REFLEX MICROSCOPIC     EKG None  Radiology DG Chest Portable 1 View  Result Date: 05/21/2020 CLINICAL DATA:  Shortness of breath EXAM: PORTABLE CHEST 1 VIEW COMPARISON:  02/04/2020 FINDINGS: Heart and mediastinal contours are within normal limits. No focal opacities or effusions. No acute bony abnormality.  Aortic atherosclerosis. IMPRESSION: No active disease. Electronically Signed   By: Charlett Nose M.D.   On: 05/21/2020 22:11   US Abdomen Limited RUQ (LIVER/GB)  Result Date: 05/21/2020 CLINICAL DATA:  Right upper quadrant pain EXAM: ULTRASOUND ABDOMEN LIMITED RIGHT UPPER QUADRANT COMPARISON:  MRI abdomen 12/02/2019 FINDINGS: Gallbladder: Tiny echogenic foci demonstrated in the dependent gallbladder but without shadowing. Possibly stones or tumefactive sludge. Largest measures about 6 mm. No gallbladder wall thickening or edema. Murphy's sign is positive. Common bile duct: Diameter: 7 mm, upper limits of normal. Liver: Diffusely increased parenchymal echotexture may indicate fatty infiltration. No focal lesions identified. Portal vein is patent on color Doppler imaging with normal direction of blood flow towards the liver. Other: None. IMPRESSION: Tiny echogenic foci in the gallbladder may represent stones or tumefactive sludge. No gallbladder wall thickening or edema. Murphy's sign is positive. Changes are likely to represent acute cholecystitis in the appropriate clinical setting. Electronically Signed   By: Burman Nieves M.D.   On: 05/21/2020 22:58    Procedures Procedures   Medications Ordered in ED Medications  cefTRIAXone (ROCEPHIN) 2 g in sodium chloride 0.9 % 100 mL IVPB (has no administration in time range)  ondansetron (ZOFRAN) injection 4 mg (has no administration in time range)  metroNIDAZOLE (FLAGYL) IVPB 500 mg (has no administration in time range)  amLODipine (NORVASC) tablet 5 mg (has no administration in time range)  Ipratropium-Albuterol (COMBIVENT) respimat 2 puff (has no administration in  time range)  acetaminophen (TYLENOL) tablet 650 mg (has no administration in time range)    Or  acetaminophen (TYLENOL) suppository 650 mg (has no administration in time range)  lactated ringers bolus 1,000 mL (has no administration in time range)  lactated ringers infusion (has no administration in time range)  morphine 2 MG/ML injection 2 mg (has no administration in time range)  morphine 4 MG/ML injection 4 mg (4 mg Intravenous Given 05/21/20 2231)  ondansetron (ZOFRAN) injection 4 mg (4 mg Intravenous Given 05/21/20 2230)  morphine 4 MG/ML injection 4 mg (4 mg Intravenous Given 05/22/20 0159)  cefTRIAXone (ROCEPHIN) 2 g in sodium chloride 0.9 % 100 mL IVPB (0 g Intravenous Stopped 05/22/20 0246)    ED Course  I have reviewed the triage vital signs and the nursing notes.  Pertinent labs & imaging results that were available during my care of the patient were reviewed by me and considered in my medical decision making (see chart for details).    MDM Rules/Calculators/A&P  65 y.o. M here with RUQ pain.  Has had intermittent pain for the past several months, somewhat of a difficult week due to persistent nausea and vomiting after eating.  Significant pain onset today around 4:30 PM.  He does appear uncomfortable on exam, voluntary guarding to his right upper abdomen.  Does have positive Murphy sign.  Labs sent, given pain and nausea medication.  Will obtain right upper quadrant ultrasound.  Patient's labs today with normal WBC count, normal LFTs, bili, and alk phos, however lipase is 2206.  I have no prior lipase values for comparison.  Ultrasound with common bile duct 7 mm, upper limits of normal without gallbladder wall thickening or pericholecystic fluid, but does have positive Murphy sign which was seen on exam as well.  Patient has had his extensive recent work-up with GI including MRI for evaluation of dilated common bile duct, measurements then were 40mm which seem consistent with today's  findings.  He also recently underwent EUS and EGD with Dr. Christella Hartigan  in February 2022 that did not reveal any choledocholithiasis at that time, but did have findings of chronic pancreatitis.  Given lab findings today, will discuss with GI.  11:52PM-- I personally called [facility] answering service, on call physician paged.  1:03 AM Second callback to [facility], spoke with Katharine Look.  Second page sent to on call provider.  1:27 AM Spoke with GI, Dr. Lyndel Safe-- recommends hospitalist admission, abx, pain control, MRCP in AM.  GI and general surgery can both consult in the AM to discuss plan going forward.  Spoke with Dr. Marlowe Sax-- will admit for ongoing care.  She is aware of GI's recommendations.  Final Clinical Impression(s) / ED Diagnoses Final diagnoses:  RUQ pain  Gallstone pancreatitis    Rx / DC Orders ED Discharge Orders    None       Larene Pickett, PA-C 05/22/20 0345    Lacretia Leigh, MD 05/24/20 [phone]

## 2020-05-22 ENCOUNTER — Other Ambulatory Visit: Payer: Self-pay

## 2020-05-22 ENCOUNTER — Inpatient Hospital Stay (HOSPITAL_COMMUNITY): Payer: Medicare Other

## 2020-05-22 DIAGNOSIS — Z8249 Family history of ischemic heart disease and other diseases of the circulatory system: Secondary | ICD-10-CM | POA: Diagnosis not present

## 2020-05-22 DIAGNOSIS — Z86711 Personal history of pulmonary embolism: Secondary | ICD-10-CM | POA: Diagnosis not present

## 2020-05-22 DIAGNOSIS — E871 Hypo-osmolality and hyponatremia: Secondary | ICD-10-CM

## 2020-05-22 DIAGNOSIS — J449 Chronic obstructive pulmonary disease, unspecified: Secondary | ICD-10-CM

## 2020-05-22 DIAGNOSIS — M19042 Primary osteoarthritis, left hand: Secondary | ICD-10-CM | POA: Diagnosis present

## 2020-05-22 DIAGNOSIS — Z635 Disruption of family by separation and divorce: Secondary | ICD-10-CM | POA: Diagnosis not present

## 2020-05-22 DIAGNOSIS — E860 Dehydration: Secondary | ICD-10-CM

## 2020-05-22 DIAGNOSIS — I10 Essential (primary) hypertension: Secondary | ICD-10-CM | POA: Diagnosis present

## 2020-05-22 DIAGNOSIS — Z20822 Contact with and (suspected) exposure to covid-19: Secondary | ICD-10-CM | POA: Diagnosis present

## 2020-05-22 DIAGNOSIS — K859 Acute pancreatitis without necrosis or infection, unspecified: Secondary | ICD-10-CM | POA: Diagnosis present

## 2020-05-22 DIAGNOSIS — K8000 Calculus of gallbladder with acute cholecystitis without obstruction: Secondary | ICD-10-CM | POA: Diagnosis present

## 2020-05-22 DIAGNOSIS — K851 Biliary acute pancreatitis without necrosis or infection: Secondary | ICD-10-CM | POA: Diagnosis present

## 2020-05-22 DIAGNOSIS — K863 Pseudocyst of pancreas: Secondary | ICD-10-CM | POA: Diagnosis present

## 2020-05-22 DIAGNOSIS — M19041 Primary osteoarthritis, right hand: Secondary | ICD-10-CM | POA: Diagnosis present

## 2020-05-22 DIAGNOSIS — R1011 Right upper quadrant pain: Secondary | ICD-10-CM

## 2020-05-22 DIAGNOSIS — K8500 Idiopathic acute pancreatitis without necrosis or infection: Secondary | ICD-10-CM

## 2020-05-22 DIAGNOSIS — Z8616 Personal history of COVID-19: Secondary | ICD-10-CM | POA: Diagnosis not present

## 2020-05-22 DIAGNOSIS — Z79899 Other long term (current) drug therapy: Secondary | ICD-10-CM | POA: Diagnosis not present

## 2020-05-22 DIAGNOSIS — K219 Gastro-esophageal reflux disease without esophagitis: Secondary | ICD-10-CM | POA: Diagnosis present

## 2020-05-22 DIAGNOSIS — E861 Hypovolemia: Secondary | ICD-10-CM | POA: Diagnosis present

## 2020-05-22 DIAGNOSIS — Z86718 Personal history of other venous thrombosis and embolism: Secondary | ICD-10-CM | POA: Diagnosis not present

## 2020-05-22 DIAGNOSIS — K08109 Complete loss of teeth, unspecified cause, unspecified class: Secondary | ICD-10-CM | POA: Diagnosis present

## 2020-05-22 DIAGNOSIS — F17210 Nicotine dependence, cigarettes, uncomplicated: Secondary | ICD-10-CM | POA: Diagnosis present

## 2020-05-22 LAB — COMPREHENSIVE METABOLIC PANEL
ALT: 17 U/L (ref 0–44)
AST: 26 U/L (ref 15–41)
Albumin: 3.4 g/dL — ABNORMAL LOW (ref 3.5–5.0)
Alkaline Phosphatase: 61 U/L (ref 38–126)
Anion gap: 12 (ref 5–15)
BUN: 7 mg/dL — ABNORMAL LOW (ref 8–23)
CO2: 21 mmol/L — ABNORMAL LOW (ref 22–32)
Calcium: 8.4 mg/dL — ABNORMAL LOW (ref 8.9–10.3)
Chloride: 102 mmol/L (ref 98–111)
Creatinine, Ser: 0.75 mg/dL (ref 0.61–1.24)
GFR, Estimated: >60 mL/min (ref >60)
Glucose, Bld: 84 mg/dL (ref 70–99)
Potassium: 4.1 mmol/L (ref 3.5–5.1)
Sodium: 135 mmol/L (ref 135–145)
Total Bilirubin: 0.5 mg/dL (ref 0.3–1.2)
Total Protein: 6.4 g/dL — ABNORMAL LOW (ref 6.5–8.1)

## 2020-05-22 LAB — RESP PANEL BY RT-PCR (FLU A&B, COVID) ARPGX2
Influenza A by PCR: NEGATIVE
Influenza B by PCR: NEGATIVE
SARS Coronavirus 2 by RT PCR: NEGATIVE

## 2020-05-22 LAB — CBC
HCT: 38.1 % — ABNORMAL LOW (ref 39.0–52.0)
Hemoglobin: 13.1 g/dL (ref 13.0–17.0)
MCH: 34.8 pg — ABNORMAL HIGH (ref 26.0–34.0)
MCHC: 34.4 g/dL (ref 30.0–36.0)
MCV: 101.3 fL — ABNORMAL HIGH (ref 80.0–100.0)
Platelets: 291 10*3/uL (ref 150–400)
RBC: 3.76 MIL/uL — ABNORMAL LOW (ref 4.22–5.81)
RDW: 12.5 % (ref 11.5–15.5)
WBC: 6 10*3/uL (ref 4.0–10.5)
nRBC: 0 % (ref 0.0–0.2)

## 2020-05-22 LAB — HIV ANTIBODY (ROUTINE TESTING W REFLEX): HIV Screen 4th Generation wRfx: NONREACTIVE

## 2020-05-22 LAB — LIPASE, BLOOD: Lipase: 187 U/L — ABNORMAL HIGH (ref 11–51)

## 2020-05-22 MED ORDER — ACETAMINOPHEN 650 MG RE SUPP: 650.0000 mg | Freq: Four times a day (QID) | RECTAL | Status: DC | PRN

## 2020-05-22 MED ORDER — LACTATED RINGERS IV SOLN: INTRAVENOUS | Status: DC

## 2020-05-22 MED ORDER — HYDROMORPHONE HCL 1 MG/ML IJ SOLN: 0.5000 mg | INTRAMUSCULAR | Status: DC | PRN

## 2020-05-22 MED ORDER — MORPHINE SULFATE (PF) 4 MG/ML IV SOLN
4.0000 mg | Freq: Once | INTRAVENOUS | Status: AC
Start: 2020-05-22 — End: 2020-05-22
  Administered 2020-05-22: 4 mg via INTRAVENOUS
  Filled 2020-05-22: qty 1

## 2020-05-22 MED ORDER — METRONIDAZOLE IN NACL 5-0.79 MG/ML-% IV SOLN
500.0000 mg | Freq: Three times a day (TID) | INTRAVENOUS | Status: DC
  Administered 2020-05-22: 500 mg via INTRAVENOUS
  Filled 2020-05-22: qty 100

## 2020-05-22 MED ORDER — ACETAMINOPHEN 325 MG PO TABS: 650.0000 mg | ORAL_TABLET | Freq: Four times a day (QID) | ORAL | Status: DC | PRN

## 2020-05-22 MED ORDER — SODIUM CHLORIDE 0.9 % IV SOLN
2.0000 g | Freq: Once | INTRAVENOUS | Status: AC
  Administered 2020-05-22: 2 g via INTRAVENOUS
  Filled 2020-05-22: qty 20

## 2020-05-22 MED ORDER — LACTATED RINGERS IV BOLUS
1000.0000 mL | Freq: Once | INTRAVENOUS | Status: AC
  Administered 2020-05-22: 1000 mL via INTRAVENOUS

## 2020-05-22 MED ORDER — GADOBUTROL 1 MMOL/ML IV SOLN
8.0000 mL | Freq: Once | INTRAVENOUS | Status: AC | PRN
  Administered 2020-05-22: 8 mL via INTRAVENOUS

## 2020-05-22 MED ORDER — TIOTROPIUM BROMIDE MONOHYDRATE 18 MCG IN CAPS
18.0000 ug | ORAL_CAPSULE | Freq: Every day | RESPIRATORY_TRACT | Status: DC
  Administered 2020-05-22 – 2020-05-25 (×4): 18 ug via RESPIRATORY_TRACT
  Filled 2020-05-22: qty 5

## 2020-05-22 MED ORDER — ONDANSETRON HCL 4 MG/2ML IJ SOLN: 4.0000 mg | Freq: Four times a day (QID) | INTRAMUSCULAR | Status: DC | PRN

## 2020-05-22 MED ORDER — AMLODIPINE BESYLATE 5 MG PO TABS
5.0000 mg | ORAL_TABLET | Freq: Every day | ORAL | Status: DC
  Administered 2020-05-22: 5 mg via ORAL
  Filled 2020-05-22: qty 1

## 2020-05-22 MED ORDER — MOMETASONE FURO-FORMOTEROL FUM 200-5 MCG/ACT IN AERO
2.0000 | INHALATION_SPRAY | Freq: Two times a day (BID) | RESPIRATORY_TRACT | Status: DC
  Administered 2020-05-22 – 2020-05-25 (×6): 2 via RESPIRATORY_TRACT
  Filled 2020-05-22: qty 8.8

## 2020-05-22 MED ORDER — IPRATROPIUM-ALBUTEROL 20-100 MCG/ACT IN AERS: 2.0000 | INHALATION_SPRAY | Freq: Four times a day (QID) | RESPIRATORY_TRACT | Status: DC | PRN

## 2020-05-22 MED ORDER — MORPHINE SULFATE (PF) 2 MG/ML IV SOLN
2.0000 mg | INTRAVENOUS | Status: DC | PRN
  Administered 2020-05-22: 2 mg via INTRAVENOUS
  Filled 2020-05-22: qty 1

## 2020-05-22 MED ORDER — SODIUM CHLORIDE 0.9 % IV SOLN: 2.0000 g | INTRAVENOUS | Status: DC

## 2020-05-22 MED ORDER — SODIUM CHLORIDE 0.9 % IV SOLN: INTRAVENOUS | Status: DC

## 2020-05-22 MED ORDER — PIPERACILLIN-TAZOBACTAM 3.375 G IVPB 30 MIN
3.3750 g | Freq: Three times a day (TID) | INTRAVENOUS | Status: DC
  Administered 2020-05-22 – 2020-05-25 (×9): 3.375 g via INTRAVENOUS
  Filled 2020-05-22 (×20): qty 50

## 2020-05-22 NOTE — Progress Notes (Signed)
PROGRESS NOTE    Justin Suarez  HYQ:[id number] DOB: 28-Feb-1955 DOA: 05/21/2020 PCP: Cyndi Bender, PA-C    Chief Complaint  Patient presents with  . 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 Patient seen by GI recently in February 9528 for colicky right upper quadrant pain underwent EUS/ERCP with findings consistent with chronic pancreatitis, pancreatic pseudocyst, no gallstones or CBD stones and referred to general surgery and was to have a cholecystectomy 05/24/2020 prior to presentation in the ED with severe worsening epigastric and abdominal pain. Patient seen in the ED noted to have a lipase of 2206, white count of 7.8, hemoglobin of 13.7, chest x-ray negative.  Right upper quadrant ultrasound concerning for acute cholecystitis. GI consulted who recommended MRCP and will consult on the patient as well as empiric IV antibiotics of Flagyl and Rocephin.   Assessment & Plan:   Principal Problem:   Acute pancreatitis Active Problems:   COPD (chronic obstructive pulmonary disease) (HCC)   Hyponatremia   Essential hypertension  1 colicky right upper quadrant pain/epigastric pain/probable gallstone pancreatitis -Likely concerning for symptomatic gallstone pancreatitis and probable acute cholecystitis. -Patient noted to have had a EUS/ERCP February 2022 with features of chronic pancreatitis, pseudocyst, no gallstones or CBD stones. -On presentation patient noted to have a lipase level of 2206 which has trended down to 187 today. -Abdominal ultrasound done concerning for acute cholecystitis. 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  Occult pancreatic head neoplasm very difficult to exclude.  No appreciable gallstones.  Minimal gallbladder sludge.  No choledocholithiasis.  Mild to moderate diffuse hepatic steatosis. -GI consulted patient seen in consultation by Dr. Lyndel Safe, who feels patient's right upper quadrant pain likely biliary in nature, no choledocholithiasis, no need for preop ERCP unless positive IOC per GI. -It is noted the patient was scheduled to have a laparoscopic cholecystectomy with IOC.  GI recommending clear liquids at this time. -Patient seen in consultation by general surgery who feel patient likely having a gallstone pancreatitis and recommending bowel rest, empiric IV antibiotics for possible cholecystitis, IV fluids and feel once symptoms have improved will likely benefit from cholecystectomy. -Per general surgery and GI.  2.  Probable acute gallstone pancreatitis See #1.  3.  Dehydration -IV fluids.  4.  Mild hyponatremia -Secondary to hypovolemic hyponatremia. -Improving with hydration. -Continue IV fluids.  5.  COPD Stable. -Placed on Dulera, nebs as needed.  6.  Hypertension -Continue Norvasc 5 mg daily and uptitrate as needed for better blood pressure control.    DVT prophylaxis: SCDs Code Status: Full Family Communication: Updated patient.  No family at bedside. Disposition:   Status is: Inpatient    Dispo: The patient is from: Home              Anticipated d/c is to: Likely home.              Patient currently on clear liquids, on IV antibiotics, being followed by general surgery and GI, probable cholecystectomy Monday, 05/23/2020.  Not stable for discharge.   Difficult  to place patient no       Consultants:   Gastroenterology: Dr. Lyndel Safe 05/22/2020  General  surgery: Dr. Marlou Starks III 05/22/2020  Procedures:   Chest x-ray 05/21/2020  MRCP 05/22/2020  Abdominal ultrasound 05/21/2020  Antimicrobials:   IV Zosyn 05/22/2020>>>>  IV Flagyl x1 dose  IV Rocephin x1 dose   Subjective: Patient sitting up in bed.  Denies any nausea or emesis.  States epigastric and right upper abdominal pain has improved since admission currently of 4/10 from a 10/10 on admission.  Tolerating clears.  Objective: Vitals:   05/22/20 0444 05/22/20 0824 05/22/20 0848 05/22/20 1251  BP: (!) 165/87  (!) 145/75 (!) 155/77  Pulse: 70  69 71  Resp: 18  15 16   Temp: (!) 97.5 F (36.4 C)  97.9 F (36.6 C) (!) 97.5 F (36.4 C)  TempSrc: Oral     SpO2: 97% 95% 93% 97%  Weight:      Height:        Intake/Output Summary (Last 24 hours) at 05/22/2020 1555 Last data filed at 05/22/2020 1234 Gross per 24 hour  Intake 1754.23 ml  Output --  Net 1754.23 ml   Filed Weights   05/21/20 2117  Weight: 80.3 kg    Examination:  General exam: Appears calm and comfortable  Respiratory system: Clear to auscultation. Respiratory effort normal. Cardiovascular system: S1 & S2 heard, RRR. No JVD, murmurs, rubs, gallops or clicks. No pedal edema. Gastrointestinal system: Abdomen is nondistended, soft and tender to palpation epigastrium and right upper quadrant.  Positive bowel sounds.  No rebound.  No guarding.  Central nervous system: Alert and oriented. No focal neurological deficits. Extremities: Symmetric 5 x 5 power. Skin: No rashes, lesions or ulcers Psychiatry: Judgement and insight appear normal. Mood & affect appropriate.     Data Reviewed: I have personally reviewed following labs and imaging studies  CBC: Recent Labs  Lab 05/20/20 1304 05/21/20 2124 05/22/20 0403  WBC 6.0 7.8 6.0  NEUTROABS 3.2  --   --   HGB 14.8 13.7 13.1  HCT 43.5 39.0 38.1*  MCV 102.1* 99.0 101.3*  PLT 365 340 657    Basic Metabolic Panel: Recent Labs  Lab 05/20/20 1304 05/21/20 2124  05/22/20 0403  NA 135 130* 135  K 4.3 3.8 4.1  CL 101 95* 102  CO2 28 23 21*  GLUCOSE 113* 104* 84  BUN 7* 8 7*  CREATININE 0.71 0.87 0.75  CALCIUM 8.9 8.6* 8.4*    GFR: Estimated Creatinine Clearance: 104 mL/min (by C-G formula based on SCr of 0.75 mg/dL).  Liver Function Tests: Recent Labs  Lab 05/20/20 1304 05/21/20 2124 05/22/20 0403  AST 23 28 26   ALT 16 20 17   ALKPHOS 73 65 61  BILITOT 0.7 0.7 0.5  PROT 7.2 7.2 6.4*  ALBUMIN 3.7 3.9 3.4*    CBG: No results for input(s): GLUCAP in the last 168 hours.   Recent Results (from the past 240 hour(s))  Resp Panel by RT-PCR (Flu A&B, Covid) Nasopharyngeal Swab     Status: None   Collection Time: 05/22/20  1:03 PM   Specimen: Nasopharyngeal Swab; Nasopharyngeal(NP) swabs in vial transport medium  Result Value Ref Range Status   SARS Coronavirus 2 by RT PCR NEGATIVE NEGATIVE Final    Comment: (NOTE) SARS-CoV-2 target nucleic acids are NOT DETECTED.  The SARS-CoV-2 RNA is generally detectable in upper respiratory specimens during the acute phase of infection. The lowest concentration of SARS-CoV-2 viral copies this assay  can detect is 138 copies/mL. A negative result does not preclude SARS-Cov-2 infection and should not be used as the sole basis for treatment or other patient management decisions. A negative result may occur with  improper specimen collection/handling, submission of specimen other than nasopharyngeal swab, presence of viral mutation(s) within the areas targeted by this assay, and inadequate number of viral copies(<138 copies/mL). A negative result must be combined with clinical observations, patient history, and epidemiological information. The expected result is Negative.  Fact Sheet for Patients:  EntrepreneurPulse.com.au  Fact Sheet for Healthcare Providers:  IncredibleEmployment.be  This test is no t yet approved or cleared by the [place] FDA and   has been authorized for detection and/or diagnosis of SARS-CoV-2 by FDA under an Emergency Use Authorization (EUA). This EUA will remain  in effect (meaning this test can be used) for the duration of the COVID-19 declaration under Section 564(b)(1) of the Act, 21 U.S.C.section 360bbb-3(b)(1), unless the authorization is terminated  or revoked sooner.       Influenza A by PCR NEGATIVE NEGATIVE Final   Influenza B by PCR NEGATIVE NEGATIVE Final    Comment: (NOTE) The Xpert Xpress SARS-CoV-2/FLU/RSV plus assay is intended as an aid in the diagnosis of influenza from Nasopharyngeal swab specimens and should not be used as a sole basis for treatment. Nasal washings and aspirates are unacceptable for Xpert Xpress SARS-CoV-2/FLU/RSV testing.  Fact Sheet for Patients: EntrepreneurPulse.com.au  Fact Sheet for Healthcare Providers: IncredibleEmployment.be  This test is not yet approved or cleared by the [place] FDA and has been authorized for detection and/or diagnosis of SARS-CoV-2 by FDA under an Emergency Use Authorization (EUA). This EUA will remain in effect (meaning this test can be used) for the duration of the COVID-19 declaration under Section 564(b)(1) of the Act, 21 U.S.C. section 360bbb-3(b)(1), unless the authorization is terminated or revoked.  Performed at [facility], [place] [address], [place], [place] [address]          Radiology Studies: MR 3D Recon At Scanner  Result Date: 05/22/2020 CLINICAL DATA:  Inpatient. Cholelithiasis and right upper quadrant abdominal pain. EXAM: MRI ABDOMEN WITHOUT AND WITH CONTRAST (INCLUDING MRCP) TECHNIQUE: Multiplanar multisequence MR imaging of the abdomen was performed both before and after the administration of intravenous contrast. Heavily T2-weighted images of the biliary and pancreatic ducts were obtained, and three-dimensional MRCP images were rendered by post  processing. CONTRAST:  30mL GADAVIST GADOBUTROL 1 MMOL/ML IV SOLN COMPARISON:  05/21/2020 abdominal sonogram.  12/02/2019 MRI abdomen. FINDINGS: Lower chest: No acute abnormality at the lung bases. Hepatobiliary: Normal liver size and configuration. Mild-to-moderate diffuse hepatic steatosis. Tiny simple 0.3 cm inferior right liver cyst. No suspicious liver masses. Nondistended gallbladder. Minimal layering sludge in the fundal gallbladder. No gallbladder wall thickening. No pericholecystic fluid. No discrete gallstones in the gallbladder. No biliary ductal dilatation. Common bile duct diameter 8 mm, mildly dilated. Somewhat sharp tapering of the common bile duct at the level of the pancreatic head. No biliary filling defects to suggest choledocholithiasis. No beading of the bile ducts. Pancreas: There is generalized enlargement and edema of the pancreatic head and neck with associated mild peripancreatic and periduodenal edema centered in the pancreaticoduodenal groove (series 3/image 18). 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 The main pancreatic duct is mildly dilated (4 mm diameter) and irregular with caliber transition at the level of the pancreatic head. There is a complex 2.3 x 1.6 cm cystic mass  in the pancreatic head (series 3/image 21) with mixed signal intensity, mild wall thickening and enhancement and no internal solid enhancement, increased from 1.4 x 1.3 cm on 12/02/2019 MRI. Preserved pancreatic parenchymal enhancement. Spleen: Normal size. No mass. Adrenals/Urinary Tract: Normal adrenals. No hydronephrosis. Normal kidneys with no renal mass. Stomach/Bowel: Normal non-distended stomach. Mild wall thickening with surrounding edema in the descending duodenum, probably reactive. Visualized small and large bowel is normal caliber, with no bowel wall thickening. Vascular/Lymphatic: Atherosclerotic nonaneurysmal abdominal aorta. Patent portal, splenic,  hepatic and renal veins. No pathologically enlarged lymph nodes in the abdomen. Other: No abdominal ascites or focal fluid collection. Musculoskeletal: No aggressive appearing focal osseous lesions. IMPRESSION: 1. 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 2. Double duct sign is present with mildly dilated (8 mm diameter) common bile duct and mildly dilated (4 mm diameter) irregular main pancreatic duct, both demonstrating somewhat abrupt caliber transitions at the level of the pancreatic head. An occult pancreatic head neoplasm is very difficult to exclude. As such, ERCP with endoscopic ultrasound is suggested when clinically feasible. Short-term follow-up MRI abdomen without and with IV contrast is also recommended in 3 months. 3. No appreciable gallstones. Minimal gallbladder sludge. No choledocholithiasis. 4. Mild-to-moderate diffuse hepatic steatosis. Electronically Signed   By: Ilona Sorrel M.D.   On: 05/22/2020 10:54   DG Chest Portable 1 View  Result Date: 05/21/2020 CLINICAL DATA:  Shortness of breath EXAM: PORTABLE CHEST 1 VIEW COMPARISON:  02/04/2020 FINDINGS: Heart and mediastinal contours are within normal limits. No focal opacities or effusions. No acute bony abnormality. Aortic atherosclerosis. IMPRESSION: No active disease. Electronically Signed   By: Rolm Baptise M.D.   On: 05/21/2020 22:11   MR ABDOMEN MRCP W WO CONTAST  Result Date: 05/22/2020 CLINICAL DATA:  Inpatient. Cholelithiasis and right upper quadrant abdominal pain. EXAM: MRI ABDOMEN WITHOUT AND WITH CONTRAST (INCLUDING MRCP) TECHNIQUE: Multiplanar multisequence MR imaging of the abdomen was performed both before and after the administration of intravenous contrast. Heavily T2-weighted images of the biliary and pancreatic ducts were obtained, and three-dimensional MRCP images were rendered by post  processing. CONTRAST:  4mL GADAVIST GADOBUTROL 1 MMOL/ML IV SOLN COMPARISON:  05/21/2020 abdominal sonogram.  12/02/2019 MRI abdomen. FINDINGS: Lower chest: No acute abnormality at the lung bases. Hepatobiliary: Normal liver size and configuration. Mild-to-moderate diffuse hepatic steatosis. Tiny simple 0.3 cm inferior right liver cyst. No suspicious liver masses. Nondistended gallbladder. Minimal layering sludge in the fundal gallbladder. No gallbladder wall thickening. No pericholecystic fluid. No discrete gallstones in the gallbladder. No biliary ductal dilatation. Common bile duct diameter 8 mm, mildly dilated. Somewhat sharp tapering of the common bile duct at the level of the pancreatic head. No biliary filling defects to suggest choledocholithiasis. No beading of the bile ducts. Pancreas: There is generalized enlargement and edema of the pancreatic head and neck with associated mild peripancreatic and periduodenal edema centered in the pancreaticoduodenal groove (series 3/image 18). 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 The main pancreatic duct is mildly dilated (4 mm diameter) and irregular with caliber transition at the level of the pancreatic head. There is a complex 2.3 x 1.6 cm cystic mass in the pancreatic head (series 3/image 21) with mixed signal intensity, mild wall thickening and enhancement and no internal solid enhancement, increased from 1.4 x 1.3 cm on 12/02/2019 MRI. Preserved pancreatic parenchymal enhancement. Spleen: Normal  size. No mass. Adrenals/Urinary Tract: Normal adrenals. No hydronephrosis. Normal kidneys with no renal mass. Stomach/Bowel: Normal non-distended stomach. Mild wall thickening with surrounding edema in the descending duodenum, probably reactive. Visualized small and large bowel is normal caliber, with no bowel wall thickening. Vascular/Lymphatic: Atherosclerotic nonaneurysmal abdominal aorta. Patent portal, splenic,  hepatic and renal veins. No pathologically enlarged lymph nodes in the abdomen. Other: No abdominal ascites or focal fluid collection. Musculoskeletal: No aggressive appearing focal osseous lesions. IMPRESSION: 1. 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 2. Double duct sign is present with mildly dilated (8 mm diameter) common bile duct and mildly dilated (4 mm diameter) irregular main pancreatic duct, both demonstrating somewhat abrupt caliber transitions at the level of the pancreatic head. An occult pancreatic head neoplasm is very difficult to exclude. As such, ERCP with endoscopic ultrasound is suggested when clinically feasible. Short-term follow-up MRI abdomen without and with IV contrast is also recommended in 3 months. 3. No appreciable gallstones. Minimal gallbladder sludge. No choledocholithiasis. 4. Mild-to-moderate diffuse hepatic steatosis. Electronically Signed   By: Ilona Sorrel M.D.   On: 05/22/2020 10:54   US Abdomen Limited RUQ (LIVER/GB)  Result Date: 05/21/2020 CLINICAL DATA:  Right upper quadrant pain EXAM: ULTRASOUND ABDOMEN LIMITED RIGHT UPPER QUADRANT COMPARISON:  MRI abdomen 12/02/2019 FINDINGS: Gallbladder: Tiny echogenic foci demonstrated in the dependent gallbladder but without shadowing. Possibly stones or tumefactive sludge. Largest measures about 6 mm. No gallbladder wall thickening or edema. Murphy's sign is positive. Common bile duct: Diameter: 7 mm, upper limits of normal. Liver: Diffusely increased parenchymal echotexture may indicate fatty infiltration. No focal lesions identified. Portal vein is patent on color Doppler imaging with normal direction of blood flow towards the liver. Other: None. IMPRESSION: Tiny echogenic foci in the gallbladder may represent stones or tumefactive sludge. No gallbladder wall thickening or edema. Murphy's sign  is positive. Changes are likely to represent acute cholecystitis in the appropriate clinical setting. Electronically Signed   By: Lucienne Capers M.D.   On: 05/21/2020 22:58        Scheduled Meds: . amLODipine  5 mg Oral Daily   Continuous Infusions: . lactated ringers 125 mL/hr at 05/22/20 1302  . piperacillin-tazobactam 3.375 g (05/22/20 1000)     LOS: 0 days    Time spent: 35 minutes  No charge.    Irine Seal, MD [facility]   To contact the attending provider between 7A-7P or the covering provider during after hours 7P-7A, please log into the web site [email or web address] and access using universal [place] password for that web site. If you do not have the password, please call the hospital operator.  05/22/2020, 3:55 PM

## 2020-05-22 NOTE — Progress Notes (Signed)
SBAR Reviewed. Questions answered by Idelle Jo.

## 2020-05-22 NOTE — H&P (Signed)
History and Physical    KAGEN KUNATH ZHG:[id number] DOB: Oct 04, 1955 DOA: 05/21/2020  PCP: Cyndi Bender, PA-C Patient coming from: Home  Chief Complaint: Abdominal pain  HPI: Justin Suarez is a 65 y.o. male with medical history significant of COPD, remote history of DVT/PE in 2013 not currently on anticoagulation, GERD, hypertension presenting with a chief complaint of right upper quadrant abdominal pain. 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  Patient was referred to general surgery and his right upper quadrant abdominal pain was felt to be consistent with gallbladder disease.  Recommendation was to pursue cholecystectomy.  Patient reports 4-month history of intermittent right upper quadrant abdominal pain associated with eating.  States he was recently seen by Dr. Redmond Pulling from general surgery and is scheduled to get his gallbladder removed on Monday, 05/24/2020.  For the past 4 days his pain has become severe.  Anytime he tries to eat, he gets severe sharp pain in his right upper quadrant and starts vomiting.  Also endorsing epigastric discomfort.  He has not been able to eat for the past few days.  Denies fevers or chills.  Denies heavy alcohol use.  Denies cough, shortness of breath, or chest pain.  ED Course: Vital signs stable.  Labs showing WBC 7.8, hemoglobin 13.7, platelet count 340K.  Sodium 130, potassium 3.8, chloride 95, bicarb 23, BUN 8, creatinine 0.8, glucose 104.  Lipase elevated at 2206.  LFTs normal.  Chest x-ray showing no active disease.  Right upper quadrant ultrasound showing tiny echogenic foci in the gallbladder, may represent stones or tumefactive sludge.  Murphy sign positive, however, no gallbladder wall thickening or edema seen on ultrasound.  ED  physician discussed the case with Dr. Lyndel Safe from [facility] who recommended treating with antibiotics and obtaining MRCP in the morning.  GI will see the patient in the morning. Patient was given morphine, Zofran, and ceftriaxone.  Review of Systems:  All systems reviewed and apart from history of presenting illness, are negative.  Past Medical History:  Diagnosis Date  . Abnormal CT scan, chest May 2013   multiple lung nodules; needs repeat study in 3 to 12 months.  . Arthritis    Hands  . Chronic anticoagulation   . Common bile duct dilation   . COPD (chronic obstructive pulmonary disease) ([place])    Pt denies  . DVT (deep venous thrombosis) ([place])   . GERD (gastroesophageal reflux disease)   . History of hiatal hernia   . HTN (hypertension)   . Noncompliance   . Pulmonary embolus ([place])   . Tick bite June 2013  . Tobacco abuse     Past Surgical History:  Procedure Laterality Date  . ANKLE SURGERY Right    x 1 from a fall  . ESOPHAGOGASTRODUODENOSCOPY (EGD) WITH PROPOFOL N/A 04/01/2020   Procedure: ESOPHAGOGASTRODUODENOSCOPY (EGD) WITH PROPOFOL;  Surgeon: Milus Banister, MD;  Location: [facility];  Service: Endoscopy;  Laterality: N/A;  . EUS N/A 04/01/2020   Procedure: UPPER ENDOSCOPIC ULTRASOUND (EUS) RADIAL;  Surgeon: Milus Banister, MD;  Location: [facility];  Service: Endoscopy;  Laterality: N/A;  . FOOT SURGERY Right    x 3 from a fall  . INGUINAL HERNIA REPAIR Right      reports that he has been smoking cigarettes. He has a 43.00  pack-year smoking history. He has never used smokeless tobacco. He reports current alcohol use. He reports that he does not use drugs.  No Known Allergies  Family History  Problem Relation Age of Onset  . Heart disease Mother        CHF  . Heart failure Mother   . Coronary artery disease Sister   . Heart disease Sister   . Coronary artery disease Sister   . Heart disease Sister   . Heart disease Father   . Atrial fibrillation  Brother   . Esophageal cancer Neg Hx   . Colon cancer Neg Hx   . Rectal cancer Neg Hx     Prior to Admission medications   Medication Sig Start Date End Date Taking? Authorizing Provider  amLODipine (NORVASC) 5 MG tablet Take 5 mg by mouth daily. 03/24/20  Yes [provider]  HYDROcodone-acetaminophen (NORCO/VICODIN) 5-325 MG tablet Take 0.5-1 tablets by mouth 4 (four) times daily as needed. 04/14/20  Yes [provider]  ondansetron (ZOFRAN) 4 MG tablet Take 4 mg by mouth 3 (three) times daily as needed for vomiting or nausea. 03/18/20  Yes [provider]    Physical Exam: Vitals:   05/22/20 0030 05/22/20 0130 05/22/20 0200 05/22/20 0230  BP: (!) 154/82 138/80 134/73 140/80  Pulse: 84 78 76 69  Resp: 18 18 18 18   Temp:  98.1 F (36.7 C)    TempSrc:  Oral    SpO2: 94% 95% 95% 90%  Weight:      Height:        Physical Exam Constitutional:      General: He is not in acute distress. HENT:     Head: Normocephalic and atraumatic.  Eyes:     Extraocular Movements: Extraocular movements intact.     Conjunctiva/sclera: Conjunctivae normal.  Cardiovascular:     Rate and Rhythm: Normal rate and regular rhythm.     Pulses: Normal pulses.  Pulmonary:     Effort: Pulmonary effort is normal. No respiratory distress.     Breath sounds: Normal breath sounds. No wheezing or rales.  Abdominal:     General: Bowel sounds are normal. There is no distension.     Palpations: Abdomen is soft.     Tenderness: There is abdominal tenderness. There is guarding.     Comments: Right upper quadrant tender to palpation  Musculoskeletal:        General: No swelling or tenderness.     Cervical back: Normal range of motion and neck supple.     Comments: Mild right ankle edema (chronic per patient due to history of prior ankle surgeries)  Skin:    General: Skin is warm and dry.  Neurological:     General: No focal deficit present.     Mental Status: He is alert and oriented  to person, place, and time.     Labs on Admission: I have personally reviewed following labs and imaging studies  CBC: Recent Labs  Lab 05/20/20 1304 05/21/20 2124  WBC 6.0 7.8  NEUTROABS 3.2  --   HGB 14.8 13.7  HCT 43.5 39.0  MCV 102.1* 99.0  PLT 365 299   Basic Metabolic Panel: Recent Labs  Lab 05/20/20 1304 05/21/20 2124  NA 135 130*  K 4.3 3.8  CL 101 95*  CO2 28 23  GLUCOSE 113* 104*  BUN 7* 8  CREATININE 0.71 0.87  CALCIUM 8.9 8.6*   GFR: Estimated Creatinine Clearance: 95.7 mL/min (by C-G formula  based on SCr of 0.87 mg/dL). Liver Function Tests: Recent Labs  Lab 05/20/20 1304 05/21/20 2124  AST 23 28  ALT 16 20  ALKPHOS 73 65  BILITOT 0.7 0.7  PROT 7.2 7.2  ALBUMIN 3.7 3.9   Recent Labs  Lab 05/21/20 2124  LIPASE 2,206*   No results for input(s): AMMONIA in the last 168 hours. Coagulation Profile: No results for input(s): INR, PROTIME in the last 168 hours. Cardiac Enzymes: No results for input(s): CKTOTAL, CKMB, CKMBINDEX, TROPONINI in the last 168 hours. BNP (last 3 results) No results for input(s): PROBNP in the last 8760 hours. HbA1C: No results for input(s): HGBA1C in the last 72 hours. CBG: No results for input(s): GLUCAP in the last 168 hours. Lipid Profile: No results for input(s): CHOL, HDL, LDLCALC, TRIG, CHOLHDL, LDLDIRECT in the last 72 hours. Thyroid Function Tests: No results for input(s): TSH, T4TOTAL, FREET4, T3FREE, THYROIDAB in the last 72 hours. Anemia Panel: No results for input(s): VITAMINB12, FOLATE, FERRITIN, TIBC, IRON, RETICCTPCT in the last 72 hours. Urine analysis:    Component Value Date/Time   COLORURINE YELLOW 05/21/2020 2317   APPEARANCEUR CLEAR 05/21/2020 2317   LABSPEC 1.006 05/21/2020 2317   PHURINE 5.0 05/21/2020 2317   GLUCOSEU NEGATIVE 05/21/2020 2317   HGBUR NEGATIVE 05/21/2020 2317   BILIRUBINUR NEGATIVE 05/21/2020 2317   KETONESUR NEGATIVE 05/21/2020 2317   PROTEINUR NEGATIVE 05/21/2020  2317   NITRITE NEGATIVE 05/21/2020 2317   LEUKOCYTESUR NEGATIVE 05/21/2020 2317    Radiological Exams on Admission: DG Chest Portable 1 View  Result Date: 05/21/2020 CLINICAL DATA:  Shortness of breath EXAM: PORTABLE CHEST 1 VIEW COMPARISON:  02/04/2020 FINDINGS: Heart and mediastinal contours are within normal limits. No focal opacities or effusions. No acute bony abnormality. Aortic atherosclerosis. IMPRESSION: No active disease. Electronically Signed   By: Rolm Baptise M.D.   On: 05/21/2020 22:11   US Abdomen Limited RUQ (LIVER/GB)  Result Date: 05/21/2020 CLINICAL DATA:  Right upper quadrant pain EXAM: ULTRASOUND ABDOMEN LIMITED RIGHT UPPER QUADRANT COMPARISON:  MRI abdomen 12/02/2019 FINDINGS: Gallbladder: Tiny echogenic foci demonstrated in the dependent gallbladder but without shadowing. Possibly stones or tumefactive sludge. Largest measures about 6 mm. No gallbladder wall thickening or edema. Murphy's sign is positive. Common bile duct: Diameter: 7 mm, upper limits of normal. Liver: Diffusely increased parenchymal echotexture may indicate fatty infiltration. No focal lesions identified. Portal vein is patent on color Doppler imaging with normal direction of blood flow towards the liver. Other: None. IMPRESSION: Tiny echogenic foci in the gallbladder may represent stones or tumefactive sludge. No gallbladder wall thickening or edema. Murphy's sign is positive. Changes are likely to represent acute cholecystitis in the appropriate clinical setting. Electronically Signed   By: Lucienne Capers M.D.   On: 05/21/2020 22:58    Assessment/Plan Principal Problem:   Acute pancreatitis Active Problems:   COPD (chronic obstructive pulmonary disease) (HCC)   Hyponatremia   Essential hypertension   Colicky right upper quadrant abdominal pain Concern for choledocholithiasis/ acute gallstone pancreatitis Per chart review, patient was seen by GI for complaints of colicky right upper quadrant  abdominal pain in February 2022 and underwent EUS/ ERCP which revealed findings consistent with chronic pancreatitis and pancreatic pseudocyst; no gallstones or CBD stones.  He was referred to general surgery and his right upper quadrant abdominal pain was felt to be consistent with gallbladder disease.  He was scheduled for cholecystectomy on Monday, 05/24/2020 but continued to be symptomatic and came into the ED today to  be evaluated. Right upper quadrant ultrasound showing tiny echogenic foci in the gallbladder, may represent stones or tumefactive sludge.  Murphy sign positive, however, no gallbladder wall thickening or edema seen on ultrasound.  Lipase is elevated at 2206 LFTs normal.  No fever, leukocytosis, or signs of sepsis.  GI recommending treating with antibiotics and obtaining MRCP due to suspicion for choledocholithiasis causing acute pancreatitis. -Continue ceftriaxone and start Flagyl.  IV fluid hydration, keep n.p.o., antiemetic as needed, pain management.  MRCP has been ordered.  Monitor WBC count, lipase, and LFTs.  GI will consult, please consult general surgery in the morning.  Mild hyponatremia: Likely due to poor oral intake. -IV fluid hydration, repeat labs in the morning  COPD: Stable.  No signs of acute exacerbation. -Bronchodilator as needed for wheezing/shortness of breath  Hypertension: Stable. -Continue home amlodipine  DVT prophylaxis: SCDs Code Status: Patient wishes to be full code. Family Communication: No family available at this time. Disposition Plan: Status is: Inpatient  Remains inpatient appropriate because:Ongoing diagnostic testing needed not appropriate for outpatient work up and Inpatient level of care appropriate due to severity of illness   Dispo: The patient is from: Home              Anticipated d/c is to: Home              Patient currently is not medically stable to d/c.   Difficult to place patient No   Level of care: Level of care: Med-Surg    The medical decision making on this patient was of high complexity and the patient is at high risk for clinical deterioration, therefore this is a level 3 visit.  Shela Leff MD [facility]  If 7PM-7AM, please contact night-coverage [email or web address]  05/22/2020, 2:43 AM

## 2020-05-22 NOTE — ED Notes (Signed)
ED TO INPATIENT HANDOFF REPORT  Name/Age/Gender Justin Suarez 65 y.o. male  Code Status    Code Status Orders  (From admission, onward)         Start     Ordered   05/22/20 0243  Full code  Continuous        05/22/20 0242        Code Status History    This patient has a current code status but no historical code status.   Advance Care Planning Activity      Home/SNF/Other Home  Chief Complaint Acute pancreatitis [K85.90]  Level of Care/Admitting Diagnosis ED Disposition    ED Disposition [facility] Area: [facility] [[id number]]  Level of Care: Med-Surg [16]  May admit patient to [facility] or [facility] if equivalent level of care is available:: Yes  Covid Evaluation: Asymptomatic Screening Protocol (No Symptoms)  Diagnosis: Acute pancreatitis [577.0.ICD-9-CM]  Admitting Physician: Shela Leff [[id number]]  Attending Physician: Shela Leff [[id number]]  Estimated length of stay: past midnight tomorrow  Certification:: I certify this patient will need inpatient services for at least 2 midnights       Medical History Past Medical History:  Diagnosis Date  . Abnormal CT scan, chest May 2013   multiple lung nodules; needs repeat study in 3 to 12 months.  . Arthritis    Hands  . Chronic anticoagulation   . Common bile duct dilation   . COPD (chronic obstructive pulmonary disease) ([place])    Pt denies  . DVT (deep venous thrombosis) ([place])   . GERD (gastroesophageal reflux disease)   . History of hiatal hernia   . HTN (hypertension)   . Noncompliance   . Pulmonary embolus ([place])   . Tick bite June 2013  . Tobacco abuse     Allergies No Known Allergies  IV Location/Drains/Wounds Patient Lines/Drains/Airways Status    Active Line/Drains/Airways    Name Placement date Placement time Site Days   Peripheral IV 05/21/20 Right;Upper Forearm 05/21/20  2137  Forearm  1          Labs/Imaging Results  for orders placed or performed during the hospital encounter of 05/21/20 (from the past 48 hour(s))  Lipase, blood     Status: Abnormal   Collection Time: 05/21/20  9:24 PM  Result Value Ref Range   Lipase 2,206 (H) 11 - 51 U/L    Comment: RESULTS CONFIRMED BY MANUAL DILUTION Performed at [facility], [place] [address] [place], [place] [address]   Comprehensive metabolic panel     Status: Abnormal   Collection Time: 05/21/20  9:24 PM  Result Value Ref Range   Sodium 130 (L) 135 - 145 mmol/L   Potassium 3.8 3.5 - 5.1 mmol/L   Chloride 95 (L) 98 - 111 mmol/L   CO2 23 22 - 32 mmol/L   Glucose, Bld 104 (H) 70 - 99 mg/dL    Comment: Glucose reference range applies only to samples taken after fasting for at least 8 hours.   BUN 8 8 - 23 mg/dL   Creatinine, Ser 0.87 0.61 - 1.24 mg/dL   Calcium 8.6 (L) 8.9 - 10.3 mg/dL   Total Protein 7.2 6.5 - 8.1 g/dL   Albumin 3.9 3.5 - 5.0 g/dL   AST 28 15 - 41 U/L   ALT 20 0 - 44 U/L   Alkaline Phosphatase 65 38 - 126 U/L   Total Bilirubin 0.7 0.3 - 1.2  mg/dL   GFR, Estimated >60 >60 mL/min    Comment: (NOTE) Calculated using the CKD-EPI Creatinine Equation (2021)    Anion gap 12 5 - 15    Comment: Performed at [facility], [place] [address] [place], [place] [address]  CBC     Status: Abnormal   Collection Time: 05/21/20  9:24 PM  Result Value Ref Range   WBC 7.8 4.0 - 10.5 K/uL   RBC 3.94 (L) 4.22 - 5.81 MIL/uL   Hemoglobin 13.7 13.0 - 17.0 g/dL   HCT 39.0 39.0 - 52.0 %   MCV 99.0 80.0 - 100.0 fL   MCH 34.8 (H) 26.0 - 34.0 pg   MCHC 35.1 30.0 - 36.0 g/dL   RDW 12.4 11.5 - 15.5 %   Platelets 340 150 - 400 K/uL   nRBC 0.0 0.0 - 0.2 %    Comment: Performed at [facility], [place] [address], [place], [place] [address]  Urinalysis, Routine w reflex microscopic Urine, Clean Catch     Status: None   Collection Time: 05/21/20 11:17 PM  Result Value Ref Range   Color, Urine YELLOW YELLOW    APPearance CLEAR CLEAR   Specific Gravity, Urine 1.006 1.005 - 1.030   pH 5.0 5.0 - 8.0   Glucose, UA NEGATIVE NEGATIVE mg/dL   Hgb urine dipstick NEGATIVE NEGATIVE   Bilirubin Urine NEGATIVE NEGATIVE   Ketones, ur NEGATIVE NEGATIVE mg/dL   Protein, ur NEGATIVE NEGATIVE mg/dL   Nitrite NEGATIVE NEGATIVE   Leukocytes,Ua NEGATIVE NEGATIVE    Comment: Performed at [facility] [place] [address] [place], [place] [address]   DG Chest Portable 1 View  Result Date: 05/21/2020 CLINICAL DATA:  Shortness of breath EXAM: PORTABLE CHEST 1 VIEW COMPARISON:  02/04/2020 FINDINGS: Heart and mediastinal contours are within normal limits. No focal opacities or effusions. No acute bony abnormality. Aortic atherosclerosis. IMPRESSION: No active disease. Electronically Signed   By: Rolm Baptise M.D.   On: 05/21/2020 22:11   US Abdomen Limited RUQ (LIVER/GB)  Result Date: 05/21/2020 CLINICAL DATA:  Right upper quadrant pain EXAM: ULTRASOUND ABDOMEN LIMITED RIGHT UPPER QUADRANT COMPARISON:  MRI abdomen 12/02/2019 FINDINGS: Gallbladder: Tiny echogenic foci demonstrated in the dependent gallbladder but without shadowing. Possibly stones or tumefactive sludge. Largest measures about 6 mm. No gallbladder wall thickening or edema. Murphy's sign is positive. Common bile duct: Diameter: 7 mm, upper limits of normal. Liver: Diffusely increased parenchymal echotexture may indicate fatty infiltration. No focal lesions identified. Portal vein is patent on color Doppler imaging with normal direction of blood flow towards the liver. Other: None. IMPRESSION: Tiny echogenic foci in the gallbladder may represent stones or tumefactive sludge. No gallbladder wall thickening or edema. Murphy's sign is positive. Changes are likely to represent acute cholecystitis in the appropriate clinical setting. Electronically Signed   By: Lucienne Capers M.D.   On: 05/21/2020 22:58    Pending Labs Unresulted Labs (From admission,  onward)          Start     Ordered   05/22/20 0500  CBC  Tomorrow morning,   R        05/22/20 0242   05/22/20 0500  Comprehensive metabolic panel  Tomorrow morning,   R        05/22/20 0310   05/22/20 0500  Lipase, blood  Tomorrow morning,   R        05/22/20 0310   05/22/20 0242  HIV Antibody (routine testing  w rflx)  (HIV Antibody (Routine testing w reflex) panel)  Once,   STAT        05/22/20 0242          Vitals/Pain Today's Vitals   05/22/20 0200 05/22/20 0230 05/22/20 0257 05/22/20 0300  BP: 134/73 140/80  (!) 142/89  Pulse: 76 69  65  Resp: 18 18  18   Temp:      TempSrc:      SpO2: 95% 90%  96%  Weight:      Height:      PainSc:   0-No pain     Isolation Precautions No active isolations  Medications Medications  cefTRIAXone (ROCEPHIN) 2 g in sodium chloride 0.9 % 100 mL IVPB (has no administration in time range)  ondansetron (ZOFRAN) injection 4 mg (has no administration in time range)  metroNIDAZOLE (FLAGYL) IVPB 500 mg (500 mg Intravenous New Bag/Given 05/22/20 0404)  amLODipine (NORVASC) tablet 5 mg (has no administration in time range)  Ipratropium-Albuterol (COMBIVENT) respimat 2 puff (has no administration in time range)  acetaminophen (TYLENOL) tablet 650 mg (has no administration in time range)    Or  acetaminophen (TYLENOL) suppository 650 mg (has no administration in time range)  lactated ringers infusion (has no administration in time range)  morphine 2 MG/ML injection 2 mg (has no administration in time range)  morphine 4 MG/ML injection 4 mg (4 mg Intravenous Given 05/21/20 2231)  ondansetron (ZOFRAN) injection 4 mg (4 mg Intravenous Given 05/21/20 2230)  morphine 4 MG/ML injection 4 mg (4 mg Intravenous Given 05/22/20 0159)  cefTRIAXone (ROCEPHIN) 2 g in sodium chloride 0.9 % 100 mL IVPB (0 g Intravenous Stopped 05/22/20 0246)  lactated ringers bolus 1,000 mL (1,000 mLs Intravenous New Bag/Given 05/22/20 0404)    Mobility walks

## 2020-05-22 NOTE — Consult Note (Addendum)
Referring Provider:  [facility] Primary Care Physician:  Cyndi Bender, PA-C Primary Gastroenterologist:  Dr. Henrene Pastor  Reason for Consultation:  Pancreatitis  HPI: Justin Suarez is a 65 y.o. male with medical history significant of COPD, remote history of DVT/PE in 2013 not currently on anticoagulation, GERD, hypertension presenting to [facility] ED with complaints of upper abdominal pain with nausea and vomiting.  In February 2022 he underwent EUS/ ERCP which revealed findings consistent with chronic pancreatitis and pancreatic pseudocyst; no gallstones or CBD stones.  Patient was referred to general surgery and his complaints of intermittent right upper quadrant abdominal pain and it was felt to be consistent with gallbladder disease.  Recommendation was to pursue cholecystectomy, which he is actually scheduled for on 4/4.  On Tuesday, however, he developed sudden onset severe upper abdominal pain with associated nausea and vomiting.  Says that he was not able to keep anything on his stomach.  Lipase was 2206 down to 187 today.  LFTs and WBC count normal.  He was started on Zosyn and IVFs.  Ultrasound showed the following:   IMPRESSION: Tiny echogenic foci in the gallbladder may represent stones or tumefactive sludge. No gallbladder wall thickening or edema. Murphy's sign is positive. Changes are likely to represent acute cholecystitis in the appropriate clinical setting.  MRI abdomen/MRCP this morning showed the following:  IMPRESSION: 1. 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 2. Double duct sign is present with mildly dilated (8 mm diameter) common bile duct and mildly dilated (4 mm diameter) irregular main pancreatic duct, both demonstrating somewhat abrupt caliber transitions at the level of the pancreatic head. An occult pancreatic head neoplasm is  very difficult to exclude. As such, ERCP with endoscopic ultrasound is suggested when clinically feasible. Short-term follow-up MRI abdomen without and with IV contrast is also recommended in 3 months. 3. No appreciable gallstones. Minimal gallbladder sludge. No choledocholithiasis. 4. Mild-to-moderate diffuse hepatic steatosis.  He is feeling a little better.  Still has pain but no nausea or vomiting.  Would like to drink some liquids.    Past Medical History:  Diagnosis Date  . Abnormal CT scan, chest May 2013   multiple lung nodules; needs repeat study in 3 to 12 months.  . Arthritis    Hands  . Chronic anticoagulation   . Common bile duct dilation   . COPD (chronic obstructive pulmonary disease) ([place])    Pt denies  . DVT (deep venous thrombosis) ([place])   . GERD (gastroesophageal reflux disease)   . History of hiatal hernia   . HTN (hypertension)   . Noncompliance   . Pulmonary embolus ([place])   . Tick bite June 2013  . Tobacco abuse     Past Surgical History:  Procedure Laterality Date  . ANKLE SURGERY Right    x 1 from a fall  . ESOPHAGOGASTRODUODENOSCOPY (EGD) WITH PROPOFOL N/A 04/01/2020   Procedure: ESOPHAGOGASTRODUODENOSCOPY (EGD) WITH PROPOFOL;  Surgeon: Milus Banister, MD;  Location: [facility] ENDOSCOPY;  Service: Endoscopy;  Laterality: N/A;  . EUS N/A 04/01/2020   Procedure: UPPER ENDOSCOPIC ULTRASOUND (EUS) RADIAL;  Surgeon: Milus Banister, MD;  Location: [facility] ENDOSCOPY;  Service: Endoscopy;  Laterality: N/A;  . FOOT SURGERY Right    x 3 from a fall  . INGUINAL HERNIA REPAIR Right     Prior to Admission medications   Medication Sig Start Date End Date Taking? Authorizing  Provider  amLODipine (NORVASC) 5 MG tablet Take 5 mg by mouth daily. 03/24/20  Yes [provider]  HYDROcodone-acetaminophen (NORCO/VICODIN) 5-325 MG tablet Take 0.5-1 tablets by mouth 4 (four) times daily as needed. 04/14/20  Yes [provider]  ondansetron (ZOFRAN) 4 MG tablet  Take 4 mg by mouth 3 (three) times daily as needed for vomiting or nausea. 03/18/20  Yes [provider]    Current Facility-Administered Medications  Medication Dose Route Frequency Provider Last Rate Last Admin  . acetaminophen (TYLENOL) tablet 650 mg  650 mg Oral Q6H PRN Shela Leff, MD       Or  . acetaminophen (TYLENOL) suppository 650 mg  650 mg Rectal Q6H PRN Shela Leff, MD      . amLODipine (NORVASC) tablet 5 mg  5 mg Oral Daily Shela Leff, MD      . Ipratropium-Albuterol (COMBIVENT) respimat 2 puff  2 puff Inhalation Q6H PRN Shela Leff, MD      . lactated ringers infusion   Intravenous Continuous Eugenie Filler, MD 150 mL/hr at 05/22/20 0508 New Bag at 05/22/20 7673  . morphine 2 MG/ML injection 2 mg  2 mg Intravenous Q4H PRN Shela Leff, MD   2 mg at 05/22/20 0514  . ondansetron (ZOFRAN) injection 4 mg  4 mg Intravenous Q6H PRN Shela Leff, MD      . piperacillin-tazobactam (ZOSYN) IVPB 3.375 g  3.375 g Intravenous Q8H Eugenie Filler, MD        Allergies as of 05/21/2020  . (No Known Allergies)    Family History  Problem Relation Age of Onset  . Heart disease Mother        CHF  . Heart failure Mother   . Coronary artery disease Sister   . Heart disease Sister   . Coronary artery disease Sister   . Heart disease Sister   . Heart disease Father   . Atrial fibrillation Brother   . Esophageal cancer Neg Hx   . Colon cancer Neg Hx   . Rectal cancer Neg Hx     Social History   Socioeconomic History  . Marital status: Legally Separated    Spouse name: Not on file  . Number of children: 0  . Years of education: Not on file  . Highest education level: Not on file  Occupational History  . Occupation: retired    Comment: Long distance Administrator  Tobacco Use  . Smoking status: Current Every Day Smoker    Packs/day: 1.00    Years: 43.00    Pack years: 43.00    Types: Cigarettes  . Smokeless tobacco:  Never Used  Vaping Use  . Vaping Use: Never used  Substance and Sexual Activity  . Alcohol use: Yes    Comment: Occasional. 4-5 beers 12 oz./week.   . Drug use: No  . Sexual activity: Not Currently  Other Topics Concern  . Not on file  Social History Narrative   He works as a Administrator. Lives in [place], [place].    Social Determinants of Health   Financial Resource Strain: Not on file  Food Insecurity: Not on file  Transportation Needs: Not on file  Physical Activity: Not on file  Stress: Not on file  Social Connections: Not on file  Intimate Partner Violence: Not on file    Review of Systems: ROS is O/W negative except as mentioned in HPI.  Physical Exam: Vital signs in last 24 hours: Temp:  [97.5 F (36.4  C)-98.4 F (36.9 C)] 97.9 F (36.6 C) (04/02 0848) Pulse Rate:  [65-91] 69 (04/02 0848) Resp:  [14-19] 15 (04/02 0848) BP: (134-165)/(73-92) 145/75 (04/02 0848) SpO2:  [90 %-100 %] 93 % (04/02 0848) Weight:  [80.3 kg] 80.3 kg (04/01 2117) Last BM Date: 05/19/20 General:  Alert, Well-developed, well-nourished, pleasant and cooperative in NAD Head:  Normocephalic and atraumatic. Eyes:  Sclera clear, no icterus.  Conjunctiva pink. Ears:  Normal auditory acuity. Mouth:  No deformity or lesions.   Lungs:  Clear throughout to auscultation.  No wheezes, crackles, or rhonchi.  Heart:  Regular rate and rhythm; no murmurs, clicks, rubs, or gallops. Abdomen:  Soft, non-distended.  BS present.  Upper abdominal TTP. Msk:  Symmetrical without gross deformities. Pulses:  Normal pulses noted. Extremities:  Without clubbing or edema. Neurologic:  Alert and oriented x 4;  grossly normal neurologically. Skin:  Intact without significant lesions or rashes. Psych:  Alert and cooperative. Normal mood and affect.  Intake/Output from previous day: 04/01 0701 - 04/02 0700 In: 1274.2 [IV Piggyback:1274.2] Out: -   Lab Results: Recent Labs    05/20/20 1304 05/21/20 2124  05/22/20 0403  WBC 6.0 7.8 6.0  HGB 14.8 13.7 13.1  HCT 43.5 39.0 38.1*  PLT 365 340 291   BMET Recent Labs    05/20/20 1304 05/21/20 2124 05/22/20 0403  NA 135 130* 135  K 4.3 3.8 4.1  CL 101 95* 102  CO2 28 23 21*  GLUCOSE 113* 104* 84  BUN 7* 8 7*  CREATININE 0.71 0.87 0.75  CALCIUM 8.9 8.6* 8.4*   LFT Recent Labs    05/22/20 0403  PROT 6.4*  ALBUMIN 3.4*  AST 26  ALT 17  ALKPHOS 61  BILITOT 0.5   Studies/Results: MR 3D Recon At Scanner  Result Date: 05/22/2020 CLINICAL DATA:  Inpatient. Cholelithiasis and right upper quadrant abdominal pain. EXAM: MRI ABDOMEN WITHOUT AND WITH CONTRAST (INCLUDING MRCP) TECHNIQUE: Multiplanar multisequence MR imaging of the abdomen was performed both before and after the administration of intravenous contrast. Heavily T2-weighted images of the biliary and pancreatic ducts were obtained, and three-dimensional MRCP images were rendered by post processing. CONTRAST:  56mL GADAVIST GADOBUTROL 1 MMOL/ML IV SOLN COMPARISON:  05/21/2020 abdominal sonogram.  12/02/2019 MRI abdomen. FINDINGS: Lower chest: No acute abnormality at the lung bases. Hepatobiliary: Normal liver size and configuration. Mild-to-moderate diffuse hepatic steatosis. Tiny simple 0.3 cm inferior right liver cyst. No suspicious liver masses. Nondistended gallbladder. Minimal layering sludge in the fundal gallbladder. No gallbladder wall thickening. No pericholecystic fluid. No discrete gallstones in the gallbladder. No biliary ductal dilatation. Common bile duct diameter 8 mm, mildly dilated. Somewhat sharp tapering of the common bile duct at the level of the pancreatic head. No biliary filling defects to suggest choledocholithiasis. No beading of the bile ducts. Pancreas: There is generalized enlargement and edema of the pancreatic head and neck with associated mild peripancreatic and periduodenal edema centered in the pancreaticoduodenal groove (series 3/image 18). Findings are most  suggestive of acute groove 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 The main pancreatic duct is mildly dilated (4 mm diameter) and irregular with caliber transition at the level of the pancreatic head. There is a complex 2.3 x 1.6 cm cystic mass in the pancreatic head (series 3/image 21) with mixed signal intensity, mild wall thickening and enhancement and no internal solid enhancement, increased from 1.4 x 1.3 cm on 12/02/2019 MRI. Preserved pancreatic parenchymal enhancement. Spleen: Normal size. No mass. Adrenals/Urinary Tract: Normal  adrenals. No hydronephrosis. Normal kidneys with no renal mass. Stomach/Bowel: Normal non-distended stomach. Mild wall thickening with surrounding edema in the descending duodenum, probably reactive. Visualized small and large bowel is normal caliber, with no bowel wall thickening. Vascular/Lymphatic: Atherosclerotic nonaneurysmal abdominal aorta. Patent portal, splenic, hepatic and renal veins. No pathologically enlarged lymph nodes in the abdomen. Other: No abdominal ascites or focal fluid collection. Musculoskeletal: No aggressive appearing focal osseous lesions. IMPRESSION: 1. 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 2. Double duct sign is present with mildly dilated (8 mm diameter) common bile duct and mildly dilated (4 mm diameter) irregular main pancreatic duct, both demonstrating somewhat abrupt caliber transitions at the level of the pancreatic head. An occult pancreatic head neoplasm is very difficult to exclude. As such, ERCP with endoscopic ultrasound is suggested when clinically feasible. Short-term follow-up MRI abdomen without and with IV contrast is also recommended in 3 months. 3. No appreciable gallstones. Minimal gallbladder sludge. No choledocholithiasis. 4. Mild-to-moderate diffuse hepatic steatosis. Electronically  Signed   By: Ilona Sorrel M.D.   On: 05/22/2020 10:54   DG Chest Portable 1 View  Result Date: 05/21/2020 CLINICAL DATA:  Shortness of breath EXAM: PORTABLE CHEST 1 VIEW COMPARISON:  02/04/2020 FINDINGS: Heart and mediastinal contours are within normal limits. No focal opacities or effusions. No acute bony abnormality. Aortic atherosclerosis. IMPRESSION: No active disease. Electronically Signed   By: Rolm Baptise M.D.   On: 05/21/2020 22:11   MR ABDOMEN MRCP W WO CONTAST  Result Date: 05/22/2020 CLINICAL DATA:  Inpatient. Cholelithiasis and right upper quadrant abdominal pain. EXAM: MRI ABDOMEN WITHOUT AND WITH CONTRAST (INCLUDING MRCP) TECHNIQUE: Multiplanar multisequence MR imaging of the abdomen was performed both before and after the administration of intravenous contrast. Heavily T2-weighted images of the biliary and pancreatic ducts were obtained, and three-dimensional MRCP images were rendered by post processing. CONTRAST:  45mL GADAVIST GADOBUTROL 1 MMOL/ML IV SOLN COMPARISON:  05/21/2020 abdominal sonogram.  12/02/2019 MRI abdomen. FINDINGS: Lower chest: No acute abnormality at the lung bases. Hepatobiliary: Normal liver size and configuration. Mild-to-moderate diffuse hepatic steatosis. Tiny simple 0.3 cm inferior right liver cyst. No suspicious liver masses. Nondistended gallbladder. Minimal layering sludge in the fundal gallbladder. No gallbladder wall thickening. No pericholecystic fluid. No discrete gallstones in the gallbladder. No biliary ductal dilatation. Common bile duct diameter 8 mm, mildly dilated. Somewhat sharp tapering of the common bile duct at the level of the pancreatic head. No biliary filling defects to suggest choledocholithiasis. No beading of the bile ducts. Pancreas: There is generalized enlargement and edema of the pancreatic head and neck with associated mild peripancreatic and periduodenal edema centered in the pancreaticoduodenal groove (series 3/image 18). Findings are  most suggestive of acute groove 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 The main pancreatic duct is mildly dilated (4 mm diameter) and irregular with caliber transition at the level of the pancreatic head. There is a complex 2.3 x 1.6 cm cystic mass in the pancreatic head (series 3/image 21) with mixed signal intensity, mild wall thickening and enhancement and no internal solid enhancement, increased from 1.4 x 1.3 cm on 12/02/2019 MRI. Preserved pancreatic parenchymal enhancement. Spleen: Normal size. No mass. Adrenals/Urinary Tract: Normal adrenals. No hydronephrosis. Normal kidneys with no renal mass. Stomach/Bowel: Normal non-distended stomach. Mild wall thickening with surrounding edema in the descending duodenum, probably reactive. Visualized small and large bowel is normal caliber, with no bowel  wall thickening. Vascular/Lymphatic: Atherosclerotic nonaneurysmal abdominal aorta. Patent portal, splenic, hepatic and renal veins. No pathologically enlarged lymph nodes in the abdomen. Other: No abdominal ascites or focal fluid collection. Musculoskeletal: No aggressive appearing focal osseous lesions. IMPRESSION: 1. 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 2. Double duct sign is present with mildly dilated (8 mm diameter) common bile duct and mildly dilated (4 mm diameter) irregular main pancreatic duct, both demonstrating somewhat abrupt caliber transitions at the level of the pancreatic head. An occult pancreatic head neoplasm is very difficult to exclude. As such, ERCP with endoscopic ultrasound is suggested when clinically feasible. Short-term follow-up MRI abdomen without and with IV contrast is also recommended in 3 months. 3. No appreciable gallstones. Minimal gallbladder sludge. No choledocholithiasis. 4. Mild-to-moderate diffuse hepatic steatosis.  Electronically Signed   By: Ilona Sorrel M.D.   On: 05/22/2020 10:54   US Abdomen Limited RUQ (LIVER/GB)  Result Date: 05/21/2020 CLINICAL DATA:  Right upper quadrant pain EXAM: ULTRASOUND ABDOMEN LIMITED RIGHT UPPER QUADRANT COMPARISON:  MRI abdomen 12/02/2019 FINDINGS: Gallbladder: Tiny echogenic foci demonstrated in the dependent gallbladder but without shadowing. Possibly stones or tumefactive sludge. Largest measures about 6 mm. No gallbladder wall thickening or edema. Murphy's sign is positive. Common bile duct: Diameter: 7 mm, upper limits of normal. Liver: Diffusely increased parenchymal echotexture may indicate fatty infiltration. No focal lesions identified. Portal vein is patent on color Doppler imaging with normal direction of blood flow towards the liver. Other: None. IMPRESSION: Tiny echogenic foci in the gallbladder may represent stones or tumefactive sludge. No gallbladder wall thickening or edema. Murphy's sign is positive. Changes are likely to represent acute cholecystitis in the appropriate clinical setting. Electronically Signed   By: Lucienne Capers M.D.   On: 05/21/2020 22:58   IMPRESSION:  *66 year old male with intermittent complaints of right upper quadrant abdominal pain that is felt to be biliary in nature and was to undergo cholecystectomy with Dr. Redmond Pulling on 4/4.  Now being admitted with acute pancreatitis.  He had an EUS just last month that showed features of chronic pancreatitis suspected due to long-term alcohol use.  LFTs are normal.  Lipase has trended down significantly.  MRI/MRCP showed groove pancreatitis with 2.3 cm pancreatic head fluid collection and mildly dilated CBD and main pancreatic duct without CBD stone.  PLAN: -Agree with supportive care including IV fluids, antiemetics, pain control.  Agree with empiric antibiotic coverage with Zosyn for now. -Monitor labs. -Will allow clear liquids. -Will need follow-up MRI in a few months. -Surgery has seen him and  is planning for cholecystectomy likely this hospitalization/early next week.   Laban Emperor. Zehr  05/22/2020, 11:17 AM     Attending physician's note   I have taken an interval history, reviewed the chart and examined the patient. I agree with the Advanced Practitioner's note, impression and recommendations.   MRCP reviewed. Recent EUS reviewed  RUQ pain -likely biliary.  He was scheduled for lap chole for Monday.No choledocholithiasis. No need to do pre-op ERCP unless + IOC. Acute on chronic pancreatitis with dilated CBD/PD, small pseudocyst-likely due to EtOH  Plan: -Clear liquid diet.  Advance as tolerated. -He is scheduled to have lap chole with IOC. -Strictly no alcohol. -Check CA 19-and CEA -Follow-up MRI pancreas in 3 months -FU in GI clinic in 4-6 weeks -We will sign off for now.   Carmell Austria, MD [facility] [phone]

## 2020-05-22 NOTE — Consult Note (Signed)
Reason for Consult:abd pain Referring Physician: Dr. Bing Matter is an 65 y.o. male.  HPI: The patient is a 65 year old white male who has known symptomatic gallstones.  He has seen Dr. Redmond Pulling in the past and was scheduled for surgery to remove the gallbladder on Monday.  Over the last couple days he developed worsening right upper quadrant pain with nausea and vomiting.  He came to the hospital where he was found to have gallstone pancreatitis.  His pancreatic enzyme levels have been improving over the last 24 hours.  His liver functions look normal.  Past Medical History:  Diagnosis Date  . Abnormal CT scan, chest May 2013   multiple lung nodules; needs repeat study in 3 to 12 months.  . Arthritis    Hands  . Chronic anticoagulation   . Common bile duct dilation   . COPD (chronic obstructive pulmonary disease) ([place])    Pt denies  . DVT (deep venous thrombosis) ([place])   . GERD (gastroesophageal reflux disease)   . History of hiatal hernia   . HTN (hypertension)   . Noncompliance   . Pulmonary embolus ([place])   . Tick bite June 2013  . Tobacco abuse     Past Surgical History:  Procedure Laterality Date  . ANKLE SURGERY Right    x 1 from a fall  . ESOPHAGOGASTRODUODENOSCOPY (EGD) WITH PROPOFOL N/A 04/01/2020   Procedure: ESOPHAGOGASTRODUODENOSCOPY (EGD) WITH PROPOFOL;  Surgeon: Milus Banister, MD;  Location: [facility];  Service: Endoscopy;  Laterality: N/A;  . EUS N/A 04/01/2020   Procedure: UPPER ENDOSCOPIC ULTRASOUND (EUS) RADIAL;  Surgeon: Milus Banister, MD;  Location: [facility];  Service: Endoscopy;  Laterality: N/A;  . FOOT SURGERY Right    x 3 from a fall  . INGUINAL HERNIA REPAIR Right     Family History  Problem Relation Age of Onset  . Heart disease Mother        CHF  . Heart failure Mother   . Coronary artery disease Sister   . Heart disease Sister   . Coronary artery disease Sister   . Heart disease Sister   . Heart disease Father   .  Atrial fibrillation Brother   . Esophageal cancer Neg Hx   . Colon cancer Neg Hx   . Rectal cancer Neg Hx     Social History:  reports that he has been smoking cigarettes. He has a 43.00 pack-year smoking history. He has never used smokeless tobacco. He reports current alcohol use. He reports that he does not use drugs.  Allergies: No Known Allergies  Medications: I have reviewed the patient's current medications.  Results for orders placed or performed during the hospital encounter of 05/21/20 (from the past 48 hour(s))  Lipase, blood     Status: Abnormal   Collection Time: 05/21/20  9:24 PM  Result Value Ref Range   Lipase 2,206 (H) 11 - 51 U/L    Comment: RESULTS CONFIRMED BY MANUAL DILUTION Performed at [facility] [address] [place], [place] [address]   Comprehensive metabolic panel     Status: Abnormal   Collection Time: 05/21/20  9:24 PM  Result Value Ref Range   Sodium 130 (L) 135 - 145 mmol/L   Potassium 3.8 3.5 - 5.1 mmol/L   Chloride 95 (L) 98 - 111 mmol/L   CO2 23 22 - 32 mmol/L   Glucose, Bld 104 (H) 70 - 99 mg/dL    Comment: Glucose reference  range applies only to samples taken after fasting for at least 8 hours.   BUN 8 8 - 23 mg/dL   Creatinine, Ser 0.87 0.61 - 1.24 mg/dL   Calcium 8.6 (L) 8.9 - 10.3 mg/dL   Total Protein 7.2 6.5 - 8.1 g/dL   Albumin 3.9 3.5 - 5.0 g/dL   AST 28 15 - 41 U/L   ALT 20 0 - 44 U/L   Alkaline Phosphatase 65 38 - 126 U/L   Total Bilirubin 0.7 0.3 - 1.2 mg/dL   GFR, Estimated >60 >60 mL/min    Comment: (NOTE) Calculated using the CKD-EPI Creatinine Equation (2021)    Anion gap 12 5 - 15    Comment: Performed at [facility], [place] [address], [place], [place] [address]  CBC     Status: Abnormal   Collection Time: 05/21/20  9:24 PM  Result Value Ref Range   WBC 7.8 4.0 - 10.5 K/uL   RBC 3.94 (L) 4.22 - 5.81 MIL/uL   Hemoglobin 13.7 13.0 - 17.0 g/dL   HCT 39.0 39.0 - 52.0 %   MCV 99.0  80.0 - 100.0 fL   MCH 34.8 (H) 26.0 - 34.0 pg   MCHC 35.1 30.0 - 36.0 g/dL   RDW 12.4 11.5 - 15.5 %   Platelets 340 150 - 400 K/uL   nRBC 0.0 0.0 - 0.2 %    Comment: Performed at [facility], [place] [address] [place], [place] [address]  Urinalysis, Routine w reflex microscopic Urine, Clean Catch     Status: None   Collection Time: 05/21/20 11:17 PM  Result Value Ref Range   Color, Urine YELLOW YELLOW   APPearance CLEAR CLEAR   Specific Gravity, Urine 1.006 1.005 - 1.030   pH 5.0 5.0 - 8.0   Glucose, UA NEGATIVE NEGATIVE mg/dL   Hgb urine dipstick NEGATIVE NEGATIVE   Bilirubin Urine NEGATIVE NEGATIVE   Ketones, ur NEGATIVE NEGATIVE mg/dL   Protein, ur NEGATIVE NEGATIVE mg/dL   Nitrite NEGATIVE NEGATIVE   Leukocytes,Ua NEGATIVE NEGATIVE    Comment: Performed at [facility], [place] [address] [place], [place] [address]  CBC     Status: Abnormal   Collection Time: 05/22/20  4:03 AM  Result Value Ref Range   WBC 6.0 4.0 - 10.5 K/uL   RBC 3.76 (L) 4.22 - 5.81 MIL/uL   Hemoglobin 13.1 13.0 - 17.0 g/dL   HCT 38.1 (L) 39.0 - 52.0 %   MCV 101.3 (H) 80.0 - 100.0 fL   MCH 34.8 (H) 26.0 - 34.0 pg   MCHC 34.4 30.0 - 36.0 g/dL   RDW 12.5 11.5 - 15.5 %   Platelets 291 150 - 400 K/uL   nRBC 0.0 0.0 - 0.2 %    Comment: Performed at [facility], [place] [address], [place], [place] [address]  Comprehensive metabolic panel     Status: Abnormal   Collection Time: 05/22/20  4:03 AM  Result Value Ref Range   Sodium 135 135 - 145 mmol/L   Potassium 4.1 3.5 - 5.1 mmol/L   Chloride 102 98 - 111 mmol/L   CO2 21 (L) 22 - 32 mmol/L   Glucose, Bld 84 70 - 99 mg/dL    Comment: Glucose reference range applies only to samples taken after fasting for at least 8 hours.   BUN 7 (L) 8 - 23 mg/dL   Creatinine, Ser 0.75 0.61 - 1.24 mg/dL   Calcium 8.4 (L)  8.9 - 10.3 mg/dL   Total Protein 6.4 (L) 6.5 - 8.1 g/dL   Albumin 3.4 (L) 3.5 - 5.0 g/dL   AST 26  15 - 41 U/L   ALT 17 0 - 44 U/L   Alkaline Phosphatase 61 38 - 126 U/L   Total Bilirubin 0.5 0.3 - 1.2 mg/dL   GFR, Estimated >60 >60 mL/min    Comment: (NOTE) Calculated using the CKD-EPI Creatinine Equation (2021)    Anion gap 12 5 - 15    Comment: Performed at [facility], [place] [address] [place], [place] [address]  Lipase, blood     Status: Abnormal   Collection Time: 05/22/20  4:03 AM  Result Value Ref Range   Lipase 187 (H) 11 - 51 U/L    Comment: Performed at [facility], [place] [address] [place], [place] [address]    DG Chest Portable 1 View  Result Date: 05/21/2020 CLINICAL DATA:  Shortness of breath EXAM: PORTABLE CHEST 1 VIEW COMPARISON:  02/04/2020 FINDINGS: Heart and mediastinal contours are within normal limits. No focal opacities or effusions. No acute bony abnormality. Aortic atherosclerosis. IMPRESSION: No active disease. Electronically Signed   By: Rolm Baptise M.D.   On: 05/21/2020 22:11   US Abdomen Limited RUQ (LIVER/GB)  Result Date: 05/21/2020 CLINICAL DATA:  Right upper quadrant pain EXAM: ULTRASOUND ABDOMEN LIMITED RIGHT UPPER QUADRANT COMPARISON:  MRI abdomen 12/02/2019 FINDINGS: Gallbladder: Tiny echogenic foci demonstrated in the dependent gallbladder but without shadowing. Possibly stones or tumefactive sludge. Largest measures about 6 mm. No gallbladder wall thickening or edema. Murphy's sign is positive. Common bile duct: Diameter: 7 mm, upper limits of normal. Liver: Diffusely increased parenchymal echotexture may indicate fatty infiltration. No focal lesions identified. Portal vein is patent on color Doppler imaging with normal direction of blood flow towards the liver. Other: None. IMPRESSION: Tiny echogenic foci in the gallbladder may represent stones or tumefactive sludge. No gallbladder wall thickening or edema. Murphy's sign is positive. Changes are likely to represent acute cholecystitis in the appropriate clinical  setting. Electronically Signed   By: Lucienne Capers M.D.   On: 05/21/2020 22:58    Review of Systems  Constitutional: Negative.   HENT: Negative.   Eyes: Negative.   Respiratory: Negative.   Cardiovascular: Negative.   Gastrointestinal: Positive for abdominal pain, nausea and vomiting.  Endocrine: Negative.   Genitourinary: Negative.   Musculoskeletal: Negative.   Skin: Negative.   Allergic/Immunologic: Negative.   Neurological: Negative.   Hematological: Negative.   Psychiatric/Behavioral: Negative.    Blood pressure (!) 145/75, pulse 69, temperature 97.9 F (36.6 C), resp. rate 15, height 6\' 1"  (1.854 m), weight 80.3 kg, SpO2 93 %. Physical Exam Constitutional:      General: He is not in acute distress.    Appearance: Normal appearance.  HENT:     Head: Normocephalic and atraumatic.     Right Ear: External ear normal.     Left Ear: External ear normal.     Nose: Nose normal.     Mouth/Throat:     Mouth: Mucous membranes are dry.     Pharynx: Oropharynx is clear.  Eyes:     General: No scleral icterus.    Extraocular Movements: Extraocular movements intact.     Conjunctiva/sclera: Conjunctivae normal.     Pupils: Pupils are equal, round, and reactive to light.  Cardiovascular:     Rate and Rhythm: Normal rate and regular rhythm.     Pulses: Normal  pulses.     Heart sounds: Normal heart sounds.  Pulmonary:     Effort: Pulmonary effort is normal. No respiratory distress.     Breath sounds: Normal breath sounds.  Abdominal:     General: Bowel sounds are normal. There is no distension.     Palpations: Abdomen is soft.     Comments: There is moderate RUQ tenderness focally  Musculoskeletal:        General: No swelling or deformity. Normal range of motion.     Cervical back: Normal range of motion and neck supple. No tenderness.  Skin:    General: Skin is warm and dry.     Coloration: Skin is not jaundiced.  Neurological:     General: No focal deficit present.      Mental Status: He is alert and oriented to person, place, and time.     Gait: Gait normal.  Psychiatric:        Mood and Affect: Mood normal.        Behavior: Behavior normal.     Assessment/Plan: The patient appears to have another episode of gallstone pancreatitis.  At this point I would put him on bowel rest and cover him with broad-spectrum antibiotics for possible cholecystitis as well.  Once his symptoms abate then I do think he would benefit from having his gallbladder removed during this admission.  It is very likely that he will be improved enough to have surgery at his regularly scheduled time on Monday.  We will continue to follow him closely with you.  Autumn Messing III 05/22/2020, 10:50 AM

## 2020-05-23 DIAGNOSIS — J449 Chronic obstructive pulmonary disease, unspecified: Secondary | ICD-10-CM | POA: Diagnosis not present

## 2020-05-23 DIAGNOSIS — E860 Dehydration: Secondary | ICD-10-CM | POA: Diagnosis not present

## 2020-05-23 DIAGNOSIS — K8000 Calculus of gallbladder with acute cholecystitis without obstruction: Secondary | ICD-10-CM | POA: Diagnosis not present

## 2020-05-23 DIAGNOSIS — I10 Essential (primary) hypertension: Secondary | ICD-10-CM | POA: Diagnosis not present

## 2020-05-23 LAB — COMPREHENSIVE METABOLIC PANEL
ALT: 14 U/L (ref 0–44)
AST: 21 U/L (ref 15–41)
Albumin: 3.1 g/dL — ABNORMAL LOW (ref 3.5–5.0)
Alkaline Phosphatase: 60 U/L (ref 38–126)
Anion gap: 8 (ref 5–15)
BUN: 8 mg/dL (ref 8–23)
CO2: 26 mmol/L (ref 22–32)
Calcium: 8.4 mg/dL — ABNORMAL LOW (ref 8.9–10.3)
Chloride: 102 mmol/L (ref 98–111)
Creatinine, Ser: 0.71 mg/dL (ref 0.61–1.24)
GFR, Estimated: >60 mL/min (ref >60)
Glucose, Bld: 87 mg/dL (ref 70–99)
Potassium: 3.9 mmol/L (ref 3.5–5.1)
Sodium: 136 mmol/L (ref 135–145)
Total Bilirubin: 0.9 mg/dL (ref 0.3–1.2)
Total Protein: 5.7 g/dL — ABNORMAL LOW (ref 6.5–8.1)

## 2020-05-23 LAB — CBC WITH DIFFERENTIAL/PLATELET
Abs Immature Granulocytes: 0.04 10*3/uL (ref 0.00–0.07)
Basophils Absolute: 0 10*3/uL (ref 0.0–0.1)
Basophils Relative: 1 %
Eosinophils Absolute: 0.2 10*3/uL (ref 0.0–0.5)
Eosinophils Relative: 3 %
HCT: 36.7 % — ABNORMAL LOW (ref 39.0–52.0)
Hemoglobin: 12.3 g/dL — ABNORMAL LOW (ref 13.0–17.0)
Immature Granulocytes: 1 %
Lymphocytes Relative: 28 %
Lymphs Abs: 1.4 10*3/uL (ref 0.7–4.0)
MCH: 34 pg (ref 26.0–34.0)
MCHC: 33.5 g/dL (ref 30.0–36.0)
MCV: 101.4 fL — ABNORMAL HIGH (ref 80.0–100.0)
Monocytes Absolute: 0.6 10*3/uL (ref 0.1–1.0)
Monocytes Relative: 11 %
Neutro Abs: 2.8 10*3/uL (ref 1.7–7.7)
Neutrophils Relative %: 56 %
Platelets: 229 10*3/uL (ref 150–400)
RBC: 3.62 MIL/uL — ABNORMAL LOW (ref 4.22–5.81)
RDW: 12.6 % (ref 11.5–15.5)
WBC: 5 10*3/uL (ref 4.0–10.5)
nRBC: 0 % (ref 0.0–0.2)

## 2020-05-23 LAB — LIPASE, BLOOD: Lipase: 36 U/L (ref 11–51)

## 2020-05-23 LAB — MAGNESIUM: Magnesium: 1.9 mg/dL (ref 1.7–2.4)

## 2020-05-23 MED ORDER — AMLODIPINE BESYLATE 10 MG PO TABS
10.0000 mg | ORAL_TABLET | Freq: Every day | ORAL | Status: DC
  Administered 2020-05-23 – 2020-05-25 (×3): 10 mg via ORAL
  Filled 2020-05-23 (×3): qty 1

## 2020-05-23 NOTE — Anesthesia Preprocedure Evaluation (Addendum)
Anesthesia Evaluation  Patient identified by MRN, date of birth, ID band Patient awake    Reviewed: Allergy & Precautions, NPO status , Patient's Chart, lab work & pertinent test results  Airway Mallampati: II  TM Distance: >3 FB Neck ROM: Full    Dental no notable dental hx. (+) Edentulous Upper, Edentulous Lower   Pulmonary COPD, Current Smoker and Patient abstained from smoking., PE   Pulmonary exam normal breath sounds clear to auscultation       Cardiovascular hypertension, Pt. on medications and Pt. on home beta blockers + DVT  Normal cardiovascular exam Rhythm:Regular Rate:Normal     Neuro/Psych negative neurological ROS  negative psych ROS   GI/Hepatic GERD  ,Chronic pancreatitis   Endo/Other    Renal/GU      Musculoskeletal  (+) Arthritis ,   Abdominal   Peds  Hematology   Anesthesia Other Findings   Reproductive/Obstetrics                           Anesthesia Physical Anesthesia Plan  ASA: III  Anesthesia Plan: General   Post-op Pain Management:    Induction: Intravenous  PONV Risk Score and Plan: Treatment may vary due to age or medical condition, Ondansetron and Midazolam  Airway Management Planned: Oral ETT  Additional Equipment: None  Intra-op Plan:   Post-operative Plan: Extubation in OR  Informed Consent: I have reviewed the patients History and Physical, chart, labs and discussed the procedure including the risks, benefits and alternatives for the proposed anesthesia with the patient or authorized representative who has indicated his/her understanding and acceptance.     Dental advisory given  Plan Discussed with: CRNA  Anesthesia Plan Comments: ([place])       Anesthesia Quick Evaluation

## 2020-05-23 NOTE — Progress Notes (Signed)
PROGRESS NOTE    Justin Suarez  WUJ:[id number] DOB: 15-Dec-1955 DOA: 05/21/2020 PCP: Cyndi Bender, PA-C    Chief Complaint  Patient presents with  . 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 Patient seen by GI recently in February 9562 for colicky right upper quadrant pain underwent EUS/ERCP with findings consistent with chronic pancreatitis, pancreatic pseudocyst, no gallstones or CBD stones and referred to general surgery and was to have a cholecystectomy 05/24/2020 prior to presentation in the ED with severe worsening epigastric and abdominal pain. Patient seen in the ED noted to have a lipase of 2206, white count of 7.8, hemoglobin of 13.7, chest x-ray negative.  Right upper quadrant ultrasound concerning for acute cholecystitis. GI consulted who recommended MRCP and will consult on the patient as well as empiric IV antibiotics of Flagyl and Rocephin.   Assessment & Plan:   Principal Problem:   Acute pancreatitis Active Problems:   COPD (chronic obstructive pulmonary disease) (HCC)   Hyponatremia   Essential hypertension   Gallstone pancreatitis   RUQ pain   Dehydration  1 colicky right upper quadrant pain/epigastric pain/probable gallstone pancreatitis -Likely concerning for symptomatic gallstone pancreatitis and probable acute cholecystitis. -Patient noted to have had a EUS/ERCP February 2022 with features of chronic pancreatitis, pseudocyst, no gallstones or CBD stones. -On presentation patient noted to have a lipase level of 2206 which has trended down to 36 today. -Abdominal ultrasound done concerning for acute cholecystitis. 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  Occult pancreatic head neoplasm very difficult to exclude.  No appreciable gallstones.  Minimal gallbladder sludge.  No choledocholithiasis.  Mild to moderate diffuse hepatic steatosis. -GI consulted patient seen in consultation by Dr. Lyndel Safe, who feels patient's right upper quadrant pain likely biliary in nature, no choledocholithiasis, no need for preop ERCP unless positive IOC per GI. -It is noted the patient was scheduled to have a laparoscopic cholecystectomy with IOC.  GI recommending clear liquids at this time. -Patient seen in consultation by general surgery who feel patient likely having a gallstone pancreatitis and recommending bowel rest, empiric IV antibiotics for possible cholecystitis, IV fluids and feel once symptoms have improved will likely benefit from cholecystectomy. -Patient improved clinically. -Lipase levels trending down currently at 36 from 187 from 03/24/2004 on admission. -Continue clear liquids and make n.p.o. after midnight for possible laparoscopic cholecystectomy tomorrow. -Continue IV Zosyn. -GI was following however signed off on 05/22/2020. -Per general surgery.  2.  Probable acute gallstone pancreatitis See #1.  3.  Dehydration -IV fluids.  4.  Mild hyponatremia -Improved with hydration.  5.  COPD Stable. -Continue Dulera, -Placed on Dulera, Spiriva, nebs as needed.  6.  Hypertension -Patient stated was on 10 mg of Norvasc at home prior to admission.   -Increase Norvasc to 10 mg daily.     DVT prophylaxis: SCDs Code Status: Full Family Communication: Updated patient.  No family at bedside. Disposition:   Status is: Inpatient    Dispo: The patient is from: Home  Anticipated d/c is to: Home              Patient currently on clear liquids, on IV antibiotics, being followed by general surgery and GI, probable cholecystectomy Monday, 05/23/2020.  Not stable for discharge.   Difficult to place patient no       Consultants:   Gastroenterology: Dr. Lyndel Safe 05/22/2020  General surgery: Dr. Marlou Starks III 05/22/2020  Procedures:   Chest x-ray 05/21/2020  MRCP 05/22/2020  Abdominal ultrasound 05/21/2020  Antimicrobials:   IV Zosyn 05/22/2020>>>>  IV Flagyl x1 dose  IV Rocephin x1 dose   Subjective: Sitting up in bed.  Denies any further nausea or emesis.  States abdominal pain has significantly improved with minimal right upper quadrant pain.  Tolerating clears.  Asking whether he could have a diet today.    Objective: Vitals:   05/22/20 2014 05/23/20 0508 05/23/20 0800 05/23/20 0803  BP: (!) 150/97 (!) 141/78    Pulse: 71 72    Resp: 16 16    Temp: 98.1 F (36.7 C) 97.8 F (36.6 C)    TempSrc: Oral Oral    SpO2: 97% 98% 100% 100%  Weight:      Height:        Intake/Output Summary (Last 24 hours) at 05/23/2020 0950 Last data filed at 05/23/2020 0900 Gross per 24 hour  Intake 2195.55 ml  Output --  Net 2195.55 ml   Filed Weights   05/21/20 2117  Weight: 80.3 kg    Examination:  General exam: : NAD Respiratory system: CTA B.  No wheezes, no rhonchi.  Speaking in full sentences.  Normal respiratory effort. Cardiovascular system: Regular rate and rhythm no murmurs rubs or gallops.  No JVD.  No lower extremity edema.  Gastrointestinal system: Abdomen soft, nondistended, no epigastric pain, minimal right upper quadrant tenderness, positive bowel sounds.  No rebound.  No guarding. Central nervous system: Alert and oriented. No focal neurological deficits. Extremities: Symmetric 5 x 5 power. Skin: No rashes, lesions or ulcers Psychiatry: Judgement and insight appear normal. Mood & affect appropriate.    Data Reviewed: I have personally reviewed  following labs and imaging studies  CBC: Recent Labs  Lab 05/20/20 1304 05/21/20 2124 05/22/20 0403 05/23/20 0301  WBC 6.0 7.8 6.0 5.0  NEUTROABS 3.2  --   --  2.8  HGB 14.8 13.7 13.1 12.3*  HCT 43.5 39.0 38.1* 36.7*  MCV 102.1* 99.0 101.3* 101.4*  PLT 365 340 291 546    Basic Metabolic Panel: Recent Labs  Lab 05/20/20 1304 05/21/20 2124 05/22/20 0403 05/23/20 0301  NA 135 130* 135 136  K 4.3 3.8 4.1 3.9  CL 101 95* 102 102  CO2 28 23 21* 26  GLUCOSE 113* 104* 84 87  BUN 7* 8 7* 8  CREATININE 0.71 0.87 0.75 0.71  CALCIUM 8.9 8.6* 8.4* 8.4*  MG  --   --   --  1.9    GFR: Estimated Creatinine Clearance: 104 mL/min (by C-G formula based on SCr of 0.71 mg/dL).  Liver Function Tests: Recent Labs  Lab 05/20/20 1304 05/21/20 2124 05/22/20 0403 05/23/20 0301  AST 23 28 26 21   ALT 16 20 17 14   ALKPHOS 73 65 61 60  BILITOT 0.7 0.7 0.5 0.9  PROT 7.2 7.2 6.4* 5.7*  ALBUMIN 3.7 3.9 3.4* 3.1*    CBG: No results for input(s): GLUCAP in the last 168 hours.   Recent Results (from the past 240 hour(s))  Resp Panel by RT-PCR (  Flu A&B, Covid) Nasopharyngeal Swab     Status: None   Collection Time: 05/22/20  1:03 PM   Specimen: Nasopharyngeal Swab; Nasopharyngeal(NP) swabs in vial transport medium  Result Value Ref Range Status   SARS Coronavirus 2 by RT PCR NEGATIVE NEGATIVE Final    Comment: (NOTE) SARS-CoV-2 target nucleic acids are NOT DETECTED.  The SARS-CoV-2 RNA is generally detectable in upper respiratory specimens during the acute phase of infection. The lowest concentration of SARS-CoV-2 viral copies this assay can detect is 138 copies/mL. A negative result does not preclude SARS-Cov-2 infection and should not be used as the sole basis for treatment or other patient management decisions. A negative result may occur with  improper specimen collection/handling, submission of specimen other than nasopharyngeal swab, presence of viral mutation(s) within  the areas targeted by this assay, and inadequate number of viral copies(<138 copies/mL). A negative result must be combined with clinical observations, patient history, and epidemiological information. The expected result is Negative.  Fact Sheet for Patients:  EntrepreneurPulse.com.au  Fact Sheet for Healthcare Providers:  IncredibleEmployment.be  This test is no t yet approved or cleared by the [place] FDA and  has been authorized for detection and/or diagnosis of SARS-CoV-2 by FDA under an Emergency Use Authorization (EUA). This EUA will remain  in effect (meaning this test can be used) for the duration of the COVID-19 declaration under Section 564(b)(1) of the Act, 21 U.S.C.section 360bbb-3(b)(1), unless the authorization is terminated  or revoked sooner.       Influenza A by PCR NEGATIVE NEGATIVE Final   Influenza B by PCR NEGATIVE NEGATIVE Final    Comment: (NOTE) The Xpert Xpress SARS-CoV-2/FLU/RSV plus assay is intended as an aid in the diagnosis of influenza from Nasopharyngeal swab specimens and should not be used as a sole basis for treatment. Nasal washings and aspirates are unacceptable for Xpert Xpress SARS-CoV-2/FLU/RSV testing.  Fact Sheet for Patients: EntrepreneurPulse.com.au  Fact Sheet for Healthcare Providers: IncredibleEmployment.be  This test is not yet approved or cleared by the [place] FDA and has been authorized for detection and/or diagnosis of SARS-CoV-2 by FDA under an Emergency Use Authorization (EUA). This EUA will remain in effect (meaning this test can be used) for the duration of the COVID-19 declaration under Section 564(b)(1) of the Act, 21 U.S.C. section 360bbb-3(b)(1), unless the authorization is terminated or revoked.  Performed at [facility], [place] [address], [place], [place] [address]          Radiology Studies: MR 3D  Recon At Scanner  Result Date: 05/22/2020 CLINICAL DATA:  Inpatient. Cholelithiasis and right upper quadrant abdominal pain. EXAM: MRI ABDOMEN WITHOUT AND WITH CONTRAST (INCLUDING MRCP) TECHNIQUE: Multiplanar multisequence MR imaging of the abdomen was performed both before and after the administration of intravenous contrast. Heavily T2-weighted images of the biliary and pancreatic ducts were obtained, and three-dimensional MRCP images were rendered by post processing. CONTRAST:  2mL GADAVIST GADOBUTROL 1 MMOL/ML IV SOLN COMPARISON:  05/21/2020 abdominal sonogram.  12/02/2019 MRI abdomen. FINDINGS: Lower chest: No acute abnormality at the lung bases. Hepatobiliary: Normal liver size and configuration. Mild-to-moderate diffuse hepatic steatosis. Tiny simple 0.3 cm inferior right liver cyst. No suspicious liver masses. Nondistended gallbladder. Minimal layering sludge in the fundal gallbladder. No gallbladder wall thickening. No pericholecystic fluid. No discrete gallstones in the gallbladder. No biliary ductal dilatation. Common bile duct diameter 8 mm, mildly dilated. Somewhat sharp tapering of the common bile duct at the level of the pancreatic head. No  biliary filling defects to suggest choledocholithiasis. No beading of the bile ducts. Pancreas: There is generalized enlargement and edema of the pancreatic head and neck with associated mild peripancreatic and periduodenal edema centered in the pancreaticoduodenal groove (series 3/image 18). 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 The main pancreatic duct is mildly dilated (4 mm diameter) and irregular with caliber transition at the level of the pancreatic head. There is a complex 2.3 x 1.6 cm cystic mass in the pancreatic head (series 3/image 21) with mixed signal intensity, mild wall thickening and enhancement and no internal solid enhancement, increased from 1.4 x 1.3 cm on 12/02/2019 MRI. Preserved  pancreatic parenchymal enhancement. Spleen: Normal size. No mass. Adrenals/Urinary Tract: Normal adrenals. No hydronephrosis. Normal kidneys with no renal mass. Stomach/Bowel: Normal non-distended stomach. Mild wall thickening with surrounding edema in the descending duodenum, probably reactive. Visualized small and large bowel is normal caliber, with no bowel wall thickening. Vascular/Lymphatic: Atherosclerotic nonaneurysmal abdominal aorta. Patent portal, splenic, hepatic and renal veins. No pathologically enlarged lymph nodes in the abdomen. Other: No abdominal ascites or focal fluid collection. Musculoskeletal: No aggressive appearing focal osseous lesions. IMPRESSION: 1. 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 2. Double duct sign is present with mildly dilated (8 mm diameter) common bile duct and mildly dilated (4 mm diameter) irregular main pancreatic duct, both demonstrating somewhat abrupt caliber transitions at the level of the pancreatic head. An occult pancreatic head neoplasm is very difficult to exclude. As such, ERCP with endoscopic ultrasound is suggested when clinically feasible. Short-term follow-up MRI abdomen without and with IV contrast is also recommended in 3 months. 3. No appreciable gallstones. Minimal gallbladder sludge. No choledocholithiasis. 4. Mild-to-moderate diffuse hepatic steatosis. Electronically Signed   By: Ilona Sorrel M.D.   On: 05/22/2020 10:54   DG Chest Portable 1 View  Result Date: 05/21/2020 CLINICAL DATA:  Shortness of breath EXAM: PORTABLE CHEST 1 VIEW COMPARISON:  02/04/2020 FINDINGS: Heart and mediastinal contours are within normal limits. No focal opacities or effusions. No acute bony abnormality. Aortic atherosclerosis. IMPRESSION: No active disease. Electronically Signed   By: Rolm Baptise M.D.   On: 05/21/2020 22:11   MR  ABDOMEN MRCP W WO CONTAST  Result Date: 05/22/2020 CLINICAL DATA:  Inpatient. Cholelithiasis and right upper quadrant abdominal pain. EXAM: MRI ABDOMEN WITHOUT AND WITH CONTRAST (INCLUDING MRCP) TECHNIQUE: Multiplanar multisequence MR imaging of the abdomen was performed both before and after the administration of intravenous contrast. Heavily T2-weighted images of the biliary and pancreatic ducts were obtained, and three-dimensional MRCP images were rendered by post processing. CONTRAST:  49mL GADAVIST GADOBUTROL 1 MMOL/ML IV SOLN COMPARISON:  05/21/2020 abdominal sonogram.  12/02/2019 MRI abdomen. FINDINGS: Lower chest: No acute abnormality at the lung bases. Hepatobiliary: Normal liver size and configuration. Mild-to-moderate diffuse hepatic steatosis. Tiny simple 0.3 cm inferior right liver cyst. No suspicious liver masses. Nondistended gallbladder. Minimal layering sludge in the fundal gallbladder. No gallbladder wall thickening. No pericholecystic fluid. No discrete gallstones in the gallbladder. No biliary ductal dilatation. Common bile duct diameter 8 mm, mildly dilated. Somewhat sharp tapering of the common bile duct at the level of the pancreatic head. No biliary filling defects to suggest choledocholithiasis. No beading of the bile ducts. Pancreas: There is generalized enlargement and edema of the pancreatic head and neck with associated mild peripancreatic and periduodenal edema centered in the pancreaticoduodenal  groove (series 3/image 18). 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 The main pancreatic duct is mildly dilated (4 mm diameter) and irregular with caliber transition at the level of the pancreatic head. There is a complex 2.3 x 1.6 cm cystic mass in the pancreatic head (series 3/image 21) with mixed signal intensity, mild wall thickening and enhancement and no internal solid enhancement, increased from 1.4 x 1.3 cm on 12/02/2019 MRI.  Preserved pancreatic parenchymal enhancement. Spleen: Normal size. No mass. Adrenals/Urinary Tract: Normal adrenals. No hydronephrosis. Normal kidneys with no renal mass. Stomach/Bowel: Normal non-distended stomach. Mild wall thickening with surrounding edema in the descending duodenum, probably reactive. Visualized small and large bowel is normal caliber, with no bowel wall thickening. Vascular/Lymphatic: Atherosclerotic nonaneurysmal abdominal aorta. Patent portal, splenic, hepatic and renal veins. No pathologically enlarged lymph nodes in the abdomen. Other: No abdominal ascites or focal fluid collection. Musculoskeletal: No aggressive appearing focal osseous lesions. IMPRESSION: 1. 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 2. Double duct sign is present with mildly dilated (8 mm diameter) common bile duct and mildly dilated (4 mm diameter) irregular main pancreatic duct, both demonstrating somewhat abrupt caliber transitions at the level of the pancreatic head. An occult pancreatic head neoplasm is very difficult to exclude. As such, ERCP with endoscopic ultrasound is suggested when clinically feasible. Short-term follow-up MRI abdomen without and with IV contrast is also recommended in 3 months. 3. No appreciable gallstones. Minimal gallbladder sludge. No choledocholithiasis. 4. Mild-to-moderate diffuse hepatic steatosis. Electronically Signed   By: Ilona Sorrel M.D.   On: 05/22/2020 10:54   US Abdomen Limited RUQ (LIVER/GB)  Result Date: 05/21/2020 CLINICAL DATA:  Right upper quadrant pain EXAM: ULTRASOUND ABDOMEN LIMITED RIGHT UPPER QUADRANT COMPARISON:  MRI abdomen 12/02/2019 FINDINGS: Gallbladder: Tiny echogenic foci demonstrated in the dependent gallbladder but without shadowing. Possibly stones or tumefactive sludge. Largest measures about 6 mm. No gallbladder wall  thickening or edema. Murphy's sign is positive. Common bile duct: Diameter: 7 mm, upper limits of normal. Liver: Diffusely increased parenchymal echotexture may indicate fatty infiltration. No focal lesions identified. Portal vein is patent on color Doppler imaging with normal direction of blood flow towards the liver. Other: None. IMPRESSION: Tiny echogenic foci in the gallbladder may represent stones or tumefactive sludge. No gallbladder wall thickening or edema. Murphy's sign is positive. Changes are likely to represent acute cholecystitis in the appropriate clinical setting. Electronically Signed   By: Lucienne Capers M.D.   On: 05/21/2020 22:58        Scheduled Meds: . amLODipine  10 mg Oral Daily  . mometasone-formoterol  2 puff Inhalation BID  . tiotropium  18 mcg Inhalation Daily   Continuous Infusions: . lactated ringers 125 mL/hr at 05/23/20 0500  . piperacillin-tazobactam 3.375 g (05/23/20 0502)     LOS: 1 day    Time spent: 35 minutes    Irine Seal, MD [facility]   To contact the attending provider between 7A-7P or the covering provider during after hours 7P-7A, please log into the web site [email or web address] and access using universal [place] password for that web site. If you do not have the password, please call the hospital operator.  05/23/2020, 9:50 AM

## 2020-05-23 NOTE — Progress Notes (Signed)
Subjective/Chief Complaint: Patient doing well without complaint this morning.  Abdominal pain is much improved.  MRI reviewed which showed sequela of pancreatitis with a small fluid collection in the head of the pancreas and some swelling at the distal common bile duct.  He denies any significant back pain or abdominal pain today.  He is scheduled for laparoscopic cholecystectomy tomorrow.   Objective: Vital signs in last 24 hours: Temp:  [97.5 F (36.4 C)-98.1 F (36.7 C)] 97.8 F (36.6 C) (04/03 0508) Pulse Rate:  [71-80] 72 (04/03 0508) Resp:  [16] 16 (04/03 0508) BP: (141-155)/(75-97) 141/78 (04/03 0508) SpO2:  [97 %-100 %] 100 % (04/03 0803) Last BM Date: 05/19/20  Intake/Output from previous day: 04/02 0701 - 04/03 0700 In: 1715.6 [P.O.:960; I.V.:705.6; IV Piggyback:50] Out: -  Intake/Output this shift: Total I/O In: 480 [P.O.:480] Out: -   Resp: clear to auscultation bilaterally GI: Mild epigastric discomfort to palpation but no rebound or guarding.  Negative Murphy sign Neurologic: Grossly normal  Lab Results:  Recent Labs    05/22/20 0403 05/23/20 0301  WBC 6.0 5.0  HGB 13.1 12.3*  HCT 38.1* 36.7*  PLT 291 229   BMET Recent Labs    05/22/20 0403 05/23/20 0301  NA 135 136  K 4.1 3.9  CL 102 102  CO2 21* 26  GLUCOSE 84 87  BUN 7* 8  CREATININE 0.75 0.71  CALCIUM 8.4* 8.4*   PT/INR No results for input(s): LABPROT, INR in the last 72 hours. ABG No results for input(s): PHART, HCO3 in the last 72 hours.  Invalid input(s): PCO2, PO2  Studies/Results: MR 3D Recon At Scanner  Result Date: 05/22/2020 CLINICAL DATA:  Inpatient. Cholelithiasis and right upper quadrant abdominal pain. EXAM: MRI ABDOMEN WITHOUT AND WITH CONTRAST (INCLUDING MRCP) TECHNIQUE: Multiplanar multisequence MR imaging of the abdomen was performed both before and after the administration of intravenous contrast. Heavily T2-weighted images of the biliary and pancreatic ducts  were obtained, and three-dimensional MRCP images were rendered by post processing. CONTRAST:  50mL GADAVIST GADOBUTROL 1 MMOL/ML IV SOLN COMPARISON:  05/21/2020 abdominal sonogram.  12/02/2019 MRI abdomen. FINDINGS: Lower chest: No acute abnormality at the lung bases. Hepatobiliary: Normal liver size and configuration. Mild-to-moderate diffuse hepatic steatosis. Tiny simple 0.3 cm inferior right liver cyst. No suspicious liver masses. Nondistended gallbladder. Minimal layering sludge in the fundal gallbladder. No gallbladder wall thickening. No pericholecystic fluid. No discrete gallstones in the gallbladder. No biliary ductal dilatation. Common bile duct diameter 8 mm, mildly dilated. Somewhat sharp tapering of the common bile duct at the level of the pancreatic head. No biliary filling defects to suggest choledocholithiasis. No beading of the bile ducts. Pancreas: There is generalized enlargement and edema of the pancreatic head and neck with associated mild peripancreatic and periduodenal edema centered in the pancreaticoduodenal groove (series 3/image 18). 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 The main pancreatic duct is mildly dilated (4 mm diameter) and irregular with caliber transition at the level of the pancreatic head. There is a complex 2.3 x 1.6 cm cystic mass in the pancreatic head (series 3/image 21) with mixed signal intensity, mild wall thickening and enhancement and no internal solid enhancement, increased from 1.4 x 1.3 cm on 12/02/2019 MRI. Preserved pancreatic parenchymal enhancement. Spleen: Normal size. No mass. Adrenals/Urinary Tract: Normal adrenals. No hydronephrosis. Normal kidneys with no renal mass. Stomach/Bowel: Normal non-distended stomach. Mild wall thickening with surrounding edema in the descending duodenum, probably reactive. Visualized  small and large bowel is normal caliber, with no bowel wall thickening. Vascular/Lymphatic:  Atherosclerotic nonaneurysmal abdominal aorta. Patent portal, splenic, hepatic and renal veins. No pathologically enlarged lymph nodes in the abdomen. Other: No abdominal ascites or focal fluid collection. Musculoskeletal: No aggressive appearing focal osseous lesions. IMPRESSION: 1. 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 2. Double duct sign is present with mildly dilated (8 mm diameter) common bile duct and mildly dilated (4 mm diameter) irregular main pancreatic duct, both demonstrating somewhat abrupt caliber transitions at the level of the pancreatic head. An occult pancreatic head neoplasm is very difficult to exclude. As such, ERCP with endoscopic ultrasound is suggested when clinically feasible. Short-term follow-up MRI abdomen without and with IV contrast is also recommended in 3 months. 3. No appreciable gallstones. Minimal gallbladder sludge. No choledocholithiasis. 4. Mild-to-moderate diffuse hepatic steatosis. Electronically Signed   By: Ilona Sorrel M.D.   On: 05/22/2020 10:54   DG Chest Portable 1 View  Result Date: 05/21/2020 CLINICAL DATA:  Shortness of breath EXAM: PORTABLE CHEST 1 VIEW COMPARISON:  02/04/2020 FINDINGS: Heart and mediastinal contours are within normal limits. No focal opacities or effusions. No acute bony abnormality. Aortic atherosclerosis. IMPRESSION: No active disease. Electronically Signed   By: Rolm Baptise M.D.   On: 05/21/2020 22:11   MR ABDOMEN MRCP W WO CONTAST  Result Date: 05/22/2020 CLINICAL DATA:  Inpatient. Cholelithiasis and right upper quadrant abdominal pain. EXAM: MRI ABDOMEN WITHOUT AND WITH CONTRAST (INCLUDING MRCP) TECHNIQUE: Multiplanar multisequence MR imaging of the abdomen was performed both before and after the administration of intravenous contrast. Heavily T2-weighted images of the biliary and pancreatic ducts  were obtained, and three-dimensional MRCP images were rendered by post processing. CONTRAST:  20mL GADAVIST GADOBUTROL 1 MMOL/ML IV SOLN COMPARISON:  05/21/2020 abdominal sonogram.  12/02/2019 MRI abdomen. FINDINGS: Lower chest: No acute abnormality at the lung bases. Hepatobiliary: Normal liver size and configuration. Mild-to-moderate diffuse hepatic steatosis. Tiny simple 0.3 cm inferior right liver cyst. No suspicious liver masses. Nondistended gallbladder. Minimal layering sludge in the fundal gallbladder. No gallbladder wall thickening. No pericholecystic fluid. No discrete gallstones in the gallbladder. No biliary ductal dilatation. Common bile duct diameter 8 mm, mildly dilated. Somewhat sharp tapering of the common bile duct at the level of the pancreatic head. No biliary filling defects to suggest choledocholithiasis. No beading of the bile ducts. Pancreas: There is generalized enlargement and edema of the pancreatic head and neck with associated mild peripancreatic and periduodenal edema centered in the pancreaticoduodenal groove (series 3/image 18). 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 The main pancreatic duct is mildly dilated (4 mm diameter) and irregular with caliber transition at the level of the pancreatic head. There is a complex 2.3 x 1.6 cm cystic mass in the pancreatic head (series 3/image 21) with mixed signal intensity, mild wall thickening and enhancement and no internal solid enhancement, increased from 1.4 x 1.3 cm on 12/02/2019 MRI. Preserved pancreatic parenchymal enhancement. Spleen: Normal size. No mass. Adrenals/Urinary Tract: Normal adrenals. No hydronephrosis. Normal kidneys with no renal mass. Stomach/Bowel: Normal non-distended stomach. Mild wall thickening with surrounding edema in the descending duodenum, probably reactive. Visualized small and large bowel is normal caliber, with no bowel wall thickening. Vascular/Lymphatic:  Atherosclerotic nonaneurysmal abdominal aorta. Patent portal, splenic, hepatic and renal veins. No pathologically enlarged lymph nodes in the abdomen. Other: No abdominal  ascites or focal fluid collection. Musculoskeletal: No aggressive appearing focal osseous lesions. IMPRESSION: 1. 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 2. Double duct sign is present with mildly dilated (8 mm diameter) common bile duct and mildly dilated (4 mm diameter) irregular main pancreatic duct, both demonstrating somewhat abrupt caliber transitions at the level of the pancreatic head. An occult pancreatic head neoplasm is very difficult to exclude. As such, ERCP with endoscopic ultrasound is suggested when clinically feasible. Short-term follow-up MRI abdomen without and with IV contrast is also recommended in 3 months. 3. No appreciable gallstones. Minimal gallbladder sludge. No choledocholithiasis. 4. Mild-to-moderate diffuse hepatic steatosis. Electronically Signed   By: Ilona Sorrel M.D.   On: 05/22/2020 10:54   US Abdomen Limited RUQ (LIVER/GB)  Result Date: 05/21/2020 CLINICAL DATA:  Right upper quadrant pain EXAM: ULTRASOUND ABDOMEN LIMITED RIGHT UPPER QUADRANT COMPARISON:  MRI abdomen 12/02/2019 FINDINGS: Gallbladder: Tiny echogenic foci demonstrated in the dependent gallbladder but without shadowing. Possibly stones or tumefactive sludge. Largest measures about 6 mm. No gallbladder wall thickening or edema. Murphy's sign is positive. Common bile duct: Diameter: 7 mm, upper limits of normal. Liver: Diffusely increased parenchymal echotexture may indicate fatty infiltration. No focal lesions identified. Portal vein is patent on color Doppler imaging with normal direction of blood flow towards the liver. Other: None. IMPRESSION: Tiny echogenic foci in the gallbladder may represent stones or  tumefactive sludge. No gallbladder wall thickening or edema. Murphy's sign is positive. Changes are likely to represent acute cholecystitis in the appropriate clinical setting. Electronically Signed   By: Lucienne Capers M.D.   On: 05/21/2020 22:58    Anti-infectives: Anti-infectives (From admission, onward)   Start     Dose/Rate Route Frequency Ordered Stop   05/23/20 0200  cefTRIAXone (ROCEPHIN) 2 g in sodium chloride 0.9 % 100 mL IVPB  Status:  Discontinued        2 g 200 mL/hr over 30 Minutes Intravenous Every 24 hours 05/22/20 0242 05/22/20 0758   05/22/20 0845  piperacillin-tazobactam (ZOSYN) IVPB 3.375 g        3.375 g 12.5 mL/hr over 4 Hours Intravenous Every 8 hours 05/22/20 0758     05/22/20 0400  metroNIDAZOLE (FLAGYL) IVPB 500 mg  Status:  Discontinued        500 mg 100 mL/hr over 60 Minutes Intravenous Every 8 hours 05/22/20 0242 05/22/20 0758   05/22/20 0145  cefTRIAXone (ROCEPHIN) 2 g in sodium chloride 0.9 % 100 mL IVPB        2 g 200 mL/hr over 30 Minutes Intravenous  Once 05/22/20 0139 05/22/20 0246      Assessment/Plan: Gallstone pancreatitis-resolving-the patient is scheduled for laparoscopic cholecystectomy tomorrow with Dr. Redmond Pulling.  Plan is to proceed with that.  Questions were answered.  N.p.o. after midnight.   LOS: 1 day    Joyice Faster Tor Tsuda 05/23/2020

## 2020-05-23 NOTE — Plan of Care (Signed)

## 2020-05-24 ENCOUNTER — Other Ambulatory Visit: Payer: Self-pay

## 2020-05-24 ENCOUNTER — Ambulatory Visit (HOSPITAL_COMMUNITY): Admission: RE | Admit: 2020-05-24 | Payer: Medicare Other | Source: Home / Self Care | Admitting: General Surgery

## 2020-05-24 ENCOUNTER — Inpatient Hospital Stay (HOSPITAL_COMMUNITY): Payer: Medicare Other | Admitting: Physician Assistant

## 2020-05-24 ENCOUNTER — Encounter (HOSPITAL_COMMUNITY)
Admission: EM | Disposition: A | Discharge status: Home / Self Care | Payer: Self-pay | Source: Home / Self Care | Attending: Internal Medicine

## 2020-05-24 ENCOUNTER — Encounter (HOSPITAL_COMMUNITY): Payer: Self-pay | Admitting: Internal Medicine

## 2020-05-24 ENCOUNTER — Inpatient Hospital Stay (HOSPITAL_COMMUNITY): Payer: Medicare Other | Admitting: Anesthesiology

## 2020-05-24 ENCOUNTER — Inpatient Hospital Stay (HOSPITAL_COMMUNITY): Payer: Medicare Other

## 2020-05-24 DIAGNOSIS — I10 Essential (primary) hypertension: Secondary | ICD-10-CM | POA: Diagnosis not present

## 2020-05-24 DIAGNOSIS — E860 Dehydration: Secondary | ICD-10-CM | POA: Diagnosis not present

## 2020-05-24 DIAGNOSIS — K8000 Calculus of gallbladder with acute cholecystitis without obstruction: Secondary | ICD-10-CM | POA: Diagnosis not present

## 2020-05-24 DIAGNOSIS — K851 Biliary acute pancreatitis without necrosis or infection: Principal | ICD-10-CM

## 2020-05-24 DIAGNOSIS — J449 Chronic obstructive pulmonary disease, unspecified: Secondary | ICD-10-CM | POA: Diagnosis not present

## 2020-05-24 HISTORY — PX: CHOLECYSTECTOMY: SHX55

## 2020-05-24 LAB — COMPREHENSIVE METABOLIC PANEL
ALT: 14 U/L (ref 0–44)
AST: 20 U/L (ref 15–41)
Albumin: 3.1 g/dL — ABNORMAL LOW (ref 3.5–5.0)
Alkaline Phosphatase: 58 U/L (ref 38–126)
Anion gap: 8 (ref 5–15)
BUN: <5 mg/dL — ABNORMAL LOW (ref 8–23)
CO2: 27 mmol/L (ref 22–32)
Calcium: 8.5 mg/dL — ABNORMAL LOW (ref 8.9–10.3)
Chloride: 102 mmol/L (ref 98–111)
Creatinine, Ser: 0.8 mg/dL (ref 0.61–1.24)
GFR, Estimated: >60 mL/min (ref >60)
Glucose, Bld: 110 mg/dL — ABNORMAL HIGH (ref 70–99)
Potassium: 3.6 mmol/L (ref 3.5–5.1)
Sodium: 137 mmol/L (ref 135–145)
Total Bilirubin: 0.7 mg/dL (ref 0.3–1.2)
Total Protein: 5.8 g/dL — ABNORMAL LOW (ref 6.5–8.1)

## 2020-05-24 LAB — CBC WITH DIFFERENTIAL/PLATELET
Abs Immature Granulocytes: 0.01 10*3/uL (ref 0.00–0.07)
Basophils Absolute: 0 10*3/uL (ref 0.0–0.1)
Basophils Relative: 0 %
Eosinophils Absolute: 0.2 10*3/uL (ref 0.0–0.5)
Eosinophils Relative: 3 %
HCT: 36.9 % — ABNORMAL LOW (ref 39.0–52.0)
Hemoglobin: 12.5 g/dL — ABNORMAL LOW (ref 13.0–17.0)
Immature Granulocytes: 0 %
Lymphocytes Relative: 33 %
Lymphs Abs: 1.5 10*3/uL (ref 0.7–4.0)
MCH: 34.9 pg — ABNORMAL HIGH (ref 26.0–34.0)
MCHC: 33.9 g/dL (ref 30.0–36.0)
MCV: 103.1 fL — ABNORMAL HIGH (ref 80.0–100.0)
Monocytes Absolute: 0.6 10*3/uL (ref 0.1–1.0)
Monocytes Relative: 14 %
Neutro Abs: 2.2 10*3/uL (ref 1.7–7.7)
Neutrophils Relative %: 50 %
Platelets: 209 10*3/uL (ref 150–400)
RBC: 3.58 MIL/uL — ABNORMAL LOW (ref 4.22–5.81)
RDW: 12.7 % (ref 11.5–15.5)
WBC: 4.5 10*3/uL (ref 4.0–10.5)
nRBC: 0 % (ref 0.0–0.2)

## 2020-05-24 LAB — SURGICAL PCR SCREEN
MRSA, PCR: NEGATIVE
Staphylococcus aureus: NEGATIVE

## 2020-05-24 LAB — CEA: CEA: 2 ng/mL (ref 0.0–4.7)

## 2020-05-24 LAB — LIPASE, BLOOD: Lipase: 40 U/L (ref 11–51)

## 2020-05-24 LAB — MAGNESIUM: Magnesium: 1.8 mg/dL (ref 1.7–2.4)

## 2020-05-24 LAB — CANCER ANTIGEN 19-9: CA 19-9: 8 U/mL (ref 0–35)

## 2020-05-24 SURGERY — LAPAROSCOPIC CHOLECYSTECTOMY
Anesthesia: General | Site: Abdomen

## 2020-05-24 MED ORDER — CHLORHEXIDINE GLUCONATE 0.12 % MT SOLN
15.0000 mL | Freq: Once | OROMUCOSAL | Status: AC
  Administered 2020-05-24: 15 mL via OROMUCOSAL
  Filled 2020-05-24: qty 15

## 2020-05-24 MED ORDER — SUGAMMADEX SODIUM 200 MG/2ML IV SOLN
INTRAVENOUS | Status: DC | PRN
  Administered 2020-05-24: 200 mg via INTRAVENOUS

## 2020-05-24 MED ORDER — SODIUM CHLORIDE 0.9 % IV SOLN
2.0000 g | INTRAVENOUS | Status: AC
  Administered 2020-05-24: 2 g via INTRAVENOUS
  Filled 2020-05-24: qty 2

## 2020-05-24 MED ORDER — PHENYLEPHRINE 40 MCG/ML (10ML) SYRINGE FOR IV PUSH (FOR BLOOD PRESSURE SUPPORT)
PREFILLED_SYRINGE | INTRAVENOUS | Status: DC | PRN
  Administered 2020-05-24: 80 ug via INTRAVENOUS

## 2020-05-24 MED ORDER — MAGNESIUM SULFATE 2 GM/50ML IV SOLN
2.0000 g | Freq: Once | INTRAVENOUS | Status: AC
  Administered 2020-05-24: 2 g via INTRAVENOUS
  Filled 2020-05-24: qty 50

## 2020-05-24 MED ORDER — LACTATED RINGERS IR SOLN
Status: DC | PRN
  Administered 2020-05-24: 1000 mL

## 2020-05-24 MED ORDER — BUPIVACAINE-EPINEPHRINE 0.25% -1:200000 IJ SOLN
INTRAMUSCULAR | Status: DC | PRN
  Administered 2020-05-24: 30 mL

## 2020-05-24 MED ORDER — LACTATED RINGERS IV SOLN: INTRAVENOUS | Status: DC

## 2020-05-24 MED ORDER — ONDANSETRON HCL 4 MG/2ML IJ SOLN: 4.0000 mg | Freq: Once | INTRAMUSCULAR | Status: DC | PRN

## 2020-05-24 MED ORDER — PROPOFOL 10 MG/ML IV BOLUS
INTRAVENOUS | Status: DC | PRN
  Administered 2020-05-24: 160 mg via INTRAVENOUS

## 2020-05-24 MED ORDER — LIDOCAINE 2% (20 MG/ML) 5 ML SYRINGE
INTRAMUSCULAR | Status: DC | PRN
  Administered 2020-05-24: 100 mg via INTRAVENOUS

## 2020-05-24 MED ORDER — 0.9 % SODIUM CHLORIDE (POUR BTL) OPTIME
TOPICAL | Status: DC | PRN
  Administered 2020-05-24: 1000 mL

## 2020-05-24 MED ORDER — ENOXAPARIN SODIUM 40 MG/0.4ML ~~LOC~~ SOLN
40.0000 mg | Freq: Once | SUBCUTANEOUS | Status: AC
  Administered 2020-05-24: 40 mg via SUBCUTANEOUS
  Filled 2020-05-24: qty 0.4

## 2020-05-24 MED ORDER — FENTANYL CITRATE (PF) 100 MCG/2ML IJ SOLN
INTRAMUSCULAR | Status: DC | PRN
  Administered 2020-05-24 (×2): 50 ug via INTRAVENOUS
  Administered 2020-05-24: 100 ug via INTRAVENOUS

## 2020-05-24 MED ORDER — CHLORHEXIDINE GLUCONATE CLOTH 2 % EX PADS: 6.0000 | MEDICATED_PAD | Freq: Once | CUTANEOUS | Status: DC

## 2020-05-24 MED ORDER — POTASSIUM CHLORIDE CRYS ER 20 MEQ PO TBCR
40.0000 meq | EXTENDED_RELEASE_TABLET | Freq: Once | ORAL | Status: AC
  Administered 2020-05-24: 40 meq via ORAL
  Filled 2020-05-24: qty 2

## 2020-05-24 MED ORDER — ORAL CARE MOUTH RINSE: 15.0000 mL | Freq: Once | OROMUCOSAL | Status: AC

## 2020-05-24 MED ORDER — KETOROLAC TROMETHAMINE 15 MG/ML IJ SOLN
INTRAMUSCULAR | Status: DC | PRN
  Administered 2020-05-24: 15 mg via INTRAVENOUS

## 2020-05-24 MED ORDER — IOHEXOL 300 MG/ML  SOLN
INTRAMUSCULAR | Status: DC | PRN
  Administered 2020-05-24: 6.5 mL

## 2020-05-24 MED ORDER — ENSURE PRE-SURGERY PO LIQD
296.0000 mL | Freq: Once | ORAL | Status: AC
  Administered 2020-05-24: 296 mL via ORAL
  Filled 2020-05-24: qty 296

## 2020-05-24 MED ORDER — ACETAMINOPHEN 10 MG/ML IV SOLN: 1000.0000 mg | Freq: Once | INTRAVENOUS | Status: DC | PRN

## 2020-05-24 MED ORDER — ROCURONIUM BROMIDE 10 MG/ML (PF) SYRINGE
PREFILLED_SYRINGE | INTRAVENOUS | Status: DC | PRN
  Administered 2020-05-24: 60 mg via INTRAVENOUS

## 2020-05-24 MED ORDER — ACETAMINOPHEN 500 MG PO TABS
1000.0000 mg | ORAL_TABLET | ORAL | Status: AC
  Administered 2020-05-24: 1000 mg via ORAL
  Filled 2020-05-24: qty 2

## 2020-05-24 MED ORDER — FENTANYL CITRATE (PF) 100 MCG/2ML IJ SOLN: 25.0000 ug | INTRAMUSCULAR | Status: DC | PRN

## 2020-05-24 MED ORDER — MIDAZOLAM HCL 5 MG/5ML IJ SOLN
INTRAMUSCULAR | Status: DC | PRN
  Administered 2020-05-24: 2 mg via INTRAVENOUS

## 2020-05-24 SURGICAL SUPPLY — 48 items
APPLICATOR ARISTA FLEXITIP XL (MISCELLANEOUS) IMPLANT
APPLIER CLIP 5 13 M/L LIGAMAX5 (MISCELLANEOUS) ×2
APPLIER CLIP ROT 10 11.4 M/L (STAPLE)
BENZOIN TINCTURE PRP APPL 2/3 (GAUZE/BANDAGES/DRESSINGS) IMPLANT
CABLE HIGH FREQUENCY MONO STRZ (ELECTRODE) ×2 IMPLANT
CHLORAPREP W/TINT 26 (MISCELLANEOUS) ×2 IMPLANT
CLIP APPLIE 5 13 M/L LIGAMAX5 (MISCELLANEOUS) ×1 IMPLANT
CLIP APPLIE ROT 10 11.4 M/L (STAPLE) IMPLANT
CLIP VESOLOCK MED LG 6/CT (CLIP) IMPLANT
COVER SURGICAL LIGHT HANDLE (MISCELLANEOUS) ×2 IMPLANT
COVER WAND RF STERILE (DRAPES) IMPLANT
DERMABOND ADVANCED (GAUZE/BANDAGES/DRESSINGS)
DERMABOND ADVANCED .7 DNX12 (GAUZE/BANDAGES/DRESSINGS) IMPLANT
DRAPE 3/4 80X56 (DRAPES) ×2 IMPLANT
DRAPE C-ARM 42X120 X-RAY (DRAPES) ×2 IMPLANT
DRSG TEGADERM 2-3/8X2-3/4 SM (GAUZE/BANDAGES/DRESSINGS) ×2 IMPLANT
ELECT REM PT RETURN 15FT ADLT (MISCELLANEOUS) ×2 IMPLANT
GAUZE SPONGE 2X2 8PLY STRL LF (GAUZE/BANDAGES/DRESSINGS) IMPLANT
GLOVE SURG ENC MOIS LTX SZ7.5 (GLOVE) ×2 IMPLANT
GLOVE SURG UNDER LTX SZ8 (GLOVE) ×2 IMPLANT
GOWN STRL REUS W/TWL XL LVL3 (GOWN DISPOSABLE) ×6 IMPLANT
GRASPER SUT TROCAR 14GX15 (MISCELLANEOUS) ×2 IMPLANT
HEMOSTAT ARISTA ABSORB 3G PWDR (HEMOSTASIS) IMPLANT
HEMOSTAT SNOW SURGICEL 2X4 (HEMOSTASIS) IMPLANT
KIT BASIN OR (CUSTOM PROCEDURE TRAY) ×2 IMPLANT
KIT TURNOVER KIT A (KITS) ×2 IMPLANT
L-HOOK LAP DISP 36CM (ELECTROSURGICAL)
LHOOK LAP DISP 36CM (ELECTROSURGICAL) IMPLANT
POUCH RETRIEVAL ECOSAC 10 (ENDOMECHANICALS) ×1 IMPLANT
POUCH RETRIEVAL ECOSAC 10MM (ENDOMECHANICALS) ×2
SCISSORS LAP 5X35 DISP (ENDOMECHANICALS) ×2 IMPLANT
SET CHOLANGIOGRAPH MIX (MISCELLANEOUS) ×2 IMPLANT
SET IRRIG TUBING LAPAROSCOPIC (IRRIGATION / IRRIGATOR) ×2 IMPLANT
SET TUBE SMOKE EVAC HIGH FLOW (TUBING) ×2 IMPLANT
SLEEVE XCEL OPT CAN 5 100 (ENDOMECHANICALS) ×4 IMPLANT
SPONGE GAUZE 2X2 8PLY STRL LF (GAUZE/BANDAGES/DRESSINGS) ×2 IMPLANT
SPONGE GAUZE 2X2 STER 10/PKG (GAUZE/BANDAGES/DRESSINGS)
STRIP CLOSURE SKIN 1/2X4 (GAUZE/BANDAGES/DRESSINGS) ×2 IMPLANT
SUT MNCRL AB 4-0 PS2 18 (SUTURE) ×2 IMPLANT
SUT VIC AB 0 UR5 27 (SUTURE) IMPLANT
SUT VICRYL 0 TIES 12 18 (SUTURE) IMPLANT
SUT VICRYL 0 UR6 27IN ABS (SUTURE) ×2 IMPLANT
TOWEL OR 17X26 10 PK STRL BLUE (TOWEL DISPOSABLE) ×2 IMPLANT
TOWEL OR NON WOVEN STRL DISP B (DISPOSABLE) ×2 IMPLANT
TRAY LAPAROSCOPIC (CUSTOM PROCEDURE TRAY) ×2 IMPLANT
TROCAR BLADELESS OPT 5 100 (ENDOMECHANICALS) ×2 IMPLANT
TROCAR XCEL BLUNT TIP 100MML (ENDOMECHANICALS) IMPLANT
TROCAR XCEL NON-BLD 11X100MML (ENDOMECHANICALS) IMPLANT

## 2020-05-24 NOTE — Progress Notes (Signed)
Acute Care Surgery Service Progress Note:    Chief Complaint/Subjective: No pain No n/v This episode of pain that brought him to hospital was the most intense episode he has had  Objective: Vital signs in last 24 hours: Temp:  [98.2 F (36.8 C)-98.5 F (36.9 C)] 98.5 F (36.9 C) (04/04 1127) Pulse Rate:  [57-75] 57 (04/04 1127) Resp:  [16-20] 18 (04/04 1127) BP: (136-163)/(68-82) 136/68 (04/04 1127) SpO2:  [98 %-100 %] 100 % (04/04 1127) Weight:  [80.3 kg] 80.3 kg (04/04 1132) Last BM Date: 05/23/20  Intake/Output from previous day: 04/03 0701 - 04/04 0700 In: 1440 [P.O.:1440] Out: -  Intake/Output this shift: No intake/output data recorded.  Lungs: cta, nonlabored  Cardiovascular: reg  Abd: soft, nt, nd  Extremities: no edema, +SCDs  Neuro: alert, nonfocal  Lab Results: CBC  Recent Labs    05/23/20 0301 05/24/20 0256  WBC 5.0 4.5  HGB 12.3* 12.5*  HCT 36.7* 36.9*  PLT 229 209   BMET Recent Labs    05/23/20 0301 05/24/20 0256  NA 136 137  K 3.9 3.6  CL 102 102  CO2 26 27  GLUCOSE 87 110*  BUN 8 <5*  CREATININE 0.71 0.80  CALCIUM 8.4* 8.5*   LFT Hepatic Function Latest Ref Rng & Units 05/24/2020 05/23/2020 05/22/2020  Total Protein 6.5 - 8.1 g/dL 5.8(L) 5.7(L) 6.4(L)  Albumin 3.5 - 5.0 g/dL 3.1(L) 3.1(L) 3.4(L)  AST 15 - 41 U/L _0 ALT 0 - 44 U/L _1 Alk Phosphatase 38 - 126 U/L 58 60 61  Total Bilirubin 0.3 - 1.2 mg/dL 0.7 0.9 0.5   PT/INR No results for input(s): LABPROT, INR in the last 72 hours. ABG No results for input(s): PHART, HCO3 in the last 72 hours.  Invalid input(s): PCO2, PO2  Studies/Results:  Anti-infectives: Anti-infectives (From admission, onward)   Start     Dose/Rate Route Frequency Ordered Stop   05/24/20 1145  cefoTEtan (CEFOTAN) 2 g in sodium chloride 0.9 % 100 mL IVPB        2 g 200 mL/hr over 30 Minutes Intravenous On call to O.R. 05/24/20 1046 05/25/20 0559   05/23/20 0200  cefTRIAXone (ROCEPHIN)  2 g in sodium chloride 0.9 % 100 mL IVPB  Status:  Discontinued        2 g 200 mL/hr over 30 Minutes Intravenous Every 24 hours 05/22/20 0242 05/22/20 0758   05/22/20 0845  [MAR Hold]  piperacillin-tazobactam (ZOSYN) IVPB 3.375 g        (MAR Hold since Mon 05/24/2020 at 1122.Hold Reason: Transfer to a Procedural area.)   3.375 g 12.5 mL/hr over 4 Hours Intravenous Every 8 hours 05/22/20 0758     05/22/20 0400  metroNIDAZOLE (FLAGYL) IVPB 500 mg  Status:  Discontinued        500 mg 100 mL/hr over 60 Minutes Intravenous Every 8 hours 05/22/20 0242 05/22/20 0758   05/22/20 0145  cefTRIAXone (ROCEPHIN) 2 g in sodium chloride 0.9 % 100 mL IVPB        2 g 200 mL/hr over 30 Minutes Intravenous  Once 05/22/20 0139 05/22/20 0246      Medications: Scheduled Meds: . [MAR Hold] amLODipine  10 mg Oral Daily  . Chlorhexidine Gluconate Cloth  6 each Topical Once   And  . Chlorhexidine Gluconate Cloth  6 each Topical Once  . [MAR Hold] mometasone-formoterol  2 puff Inhalation BID  . [MAR Hold] tiotropium  18 mcg  Inhalation Daily   Continuous Infusions: . cefoTEtan (CEFOTAN) IV    . lactated ringers 75 mL/hr at 05/24/20 0952  . lactated ringers 10 mL/hr at 05/24/20 1112  . [MAR Hold] piperacillin-tazobactam 3.375 g (05/24/20 0547)   PRN Meds:.[MAR Hold] acetaminophen **OR** [MAR Hold] acetaminophen, [MAR Hold] Ipratropium-Albuterol, [MAR Hold]  morphine injection, [MAR Hold] ondansetron (ZOFRAN) IV  Assessment/Plan: Patient Active Problem List   Diagnosis Date Noted  . Acute pancreatitis 05/22/2020  . Hyponatremia 05/22/2020  . Essential hypertension 05/22/2020  . Gallstone pancreatitis   . RUQ pain   . Dehydration   . Tick bite of abdomen 08/07/2011  . Chest pain 08/07/2011  . Elevated blood pressure 08/07/2011  . COPD (chronic obstructive pulmonary disease) (HCC) 04/13/2011  . Pulmonary embolus (HCC) 03/15/2011  . Tobacco abuse 03/15/2011  . Long term (current) use of anticoagulants  03/15/2011  . DVT, lower extremity (HCC) 03/15/2011   Acute on Chronic pancreatitis enlarging panc head pseudocyst RUQ pain, biliary colic - tiny stone vs sludge  I discussed the case with Dr. Jacobs.  He reviewed this admissions imaging. His opinion is that the patient probably is suffering from biliary colic.  I think that is a reasonable assumption.  He recommended proceeding with cholecystectomy.  I am in concurrence  However I did discuss with the patient that it is possible that he some of his upper abdominal pain could be coming from his chronic pancreatitis/pancreatic divisum/enlarging pancreatic pseudocyst  He understands but wishes to proceed with cholecystectomy I think that is reasonable  I did discuss the resident's involvement with his case for today  I agree that he will need short interval follow-up with an MRI in 3 months to make sure his pseudocyst does not continue to enlarge  We will give a dose of Lovenox preoperatively because of his history of VTE IV antibiotic on-call All questions asked and answered  Disposition: to OR for lap chole with ioc  LOS: 2 days    Eric M. Wilson, MD, FACS General, Bariatric, & Minimally Invasive Surgery [phone] [facility]  

## 2020-05-24 NOTE — Progress Notes (Signed)
Chart reviewed.  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   If ultimately needs something surgically for panc head, not sure if proceeding with cholecystectomy now is best decision so I would like GI to weigh in as well.    Will tentatively keep pt on schedule for today but will switch sequences of cases.   Given his h/o dvt - i'm ok with chemical DVT prophylaxis.  Leighton Ruff. Redmond Pulling, MD, FACS General, Bariatric, & Minimally Invasive Surgery [facility], [place]

## 2020-05-24 NOTE — Progress Notes (Signed)
PROGRESS NOTE    Justin Suarez  IPJ:[id number] DOB: [birth year] DOA: 05/21/2020 PCP: Cyndi Bender, PA-C    Chief Complaint  Patient presents with  . 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 Patient seen by GI recently in February 7341 for colicky right upper quadrant pain underwent EUS/ERCP with findings consistent with chronic pancreatitis, pancreatic pseudocyst, no gallstones or CBD stones and referred to general surgery and was to have a cholecystectomy 05/24/2020 prior to presentation in the ED with severe worsening epigastric and abdominal pain. Patient seen in the ED noted to have a lipase of 2206, white count of 7.8, hemoglobin of 13.7, chest x-ray negative.  Right upper quadrant ultrasound concerning for acute cholecystitis. GI consulted who recommended MRCP and will consult on the patient as well as empiric IV antibiotics of Flagyl and Rocephin.   Assessment & Plan:   Principal Problem:   Acute pancreatitis Active Problems:   COPD (chronic obstructive pulmonary disease) (HCC)   Hyponatremia   Essential hypertension   Gallstone pancreatitis   RUQ pain   Dehydration  1 colicky right upper quadrant pain/epigastric pain/probable gallstone pancreatitis -Likely concerning for symptomatic gallstone pancreatitis and probable acute cholecystitis. -Patient noted to have had a EUS/ERCP February 2022 with features of chronic pancreatitis, pseudocyst, no gallstones or CBD stones. -On presentation patient noted to have a lipase level of 2206 which has trended down to 40 today. -Abdominal ultrasound done concerning for acute cholecystitis. -Patient with clinical improvement with no further abdominal pain.  Likely passed gallstone. 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  Occult pancreatic head neoplasm very difficult to exclude.  No appreciable gallstones.  Minimal gallbladder sludge.  No choledocholithiasis.  Mild to moderate diffuse hepatic steatosis. -GI consulted patient seen in consultation by Dr. Lyndel Safe, who feels patient's right upper quadrant pain likely biliary in nature, no choledocholithiasis, no need for preop ERCP unless positive IOC per GI. -It is noted the patient was scheduled to have a laparoscopic cholecystectomy with IOC today for 05/24/2020. -Patient seen in consultation by general surgery who feel patient likely having a gallstone pancreatitis and recommending bowel rest, empiric IV antibiotics for possible cholecystitis, IV fluids and feel once symptoms have improved will likely benefit from cholecystectomy. -Lipase levels trended down currently at 40 from 2206 on admission. -Currently n.p.o. in anticipation of laparoscopic cholecystectomy today. -Continue IV Zosyn -GI was following but signed off on 05/22/2020. -Dr. Redmond Pulling with general surgery has asked Dr. Ardis Hughs with GI to review chart, assess patient for noted enlarging pancreatic head mass which general surgery feel may be causing his symptoms. -GI to reweigh in again today prior to decision on laparoscopic cholecystectomy hopefully to be done today. -Per general surgery.  2.  Probable acute gallstone pancreatitis See #1.  3.  Dehydration -IV fluids.  4.  Mild hyponatremia -Improved with hydration.  5.  COPD -Stable.   -Continue Dulera, Spiriva, nebs as needed.  6.  Hypertension -Continue Norvasc 10 mg daily.    DVT prophylaxis: SCDs Code Status:  Full Family Communication: Updated patient.  No family at bedside. Disposition:   Status is: Inpatient    Dispo: The patient is from: Home              Anticipated d/c is to: Home              Patient currently on clear liquids, on IV antibiotics, being followed by general surgery and GI, probable cholecystectomy today, 05/23/2020.  Not stable for discharge.   Difficult to place patient no       Consultants:   Gastroenterology: Dr. Lyndel Safe 05/22/2020  General surgery: Dr. Marlou Starks III 05/22/2020  Procedures:   Chest x-ray 05/21/2020  MRCP 05/22/2020  Abdominal ultrasound 05/21/2020  Antimicrobials:   IV Zosyn 05/22/2020>>>>  IV Flagyl x1 dose  IV Rocephin x1 dose   Subjective: Sitting up in bed.  No chest pain.  No shortness of breath.  No nausea.  No emesis.  Denies any further abdominal pain.  Awaiting laparoscopic cholecystectomy today.  Objective: Vitals:   05/23/20 0800 05/23/20 0803 05/23/20 2017 05/24/20 0420  BP:   (!) 163/77 138/82  Pulse:   75 65  Resp:   20 16  Temp:   98.2 F (36.8 C) 98.2 F (36.8 C)  TempSrc:   Oral Oral  SpO2: 100% 100% 100% 98%  Weight:      Height:        Intake/Output Summary (Last 24 hours) at 05/24/2020 0852 Last data filed at 05/23/2020 1817 Gross per 24 hour  Intake 1440 ml  Output --  Net 1440 ml   Filed Weights   05/21/20 2117  Weight: 80.3 kg    Examination: General exam: : NAD Respiratory system: CTA B.  No wheezes, no rhonchi.  Speaking in full sentences.  Normal respiratory effort. Cardiovascular system: Regular rate and rhythm no murmurs rubs or gallops.  No JVD.  No lower extremity edema.  Gastrointestinal system: Abdomen soft, nontender, nondistended, positive bowel sounds.  No rebound.  No guarding. Central nervous system: Alert and oriented. No focal neurological deficits. Extremities: Symmetric 5 x 5 power. Skin: No rashes, lesions or ulcers Psychiatry: Judgement and insight appear normal. Mood & affect  appropriate.   Data Reviewed: I have personally reviewed following labs and imaging studies  CBC: Recent Labs  Lab 05/20/20 1304 05/21/20 2124 05/22/20 0403 05/23/20 0301 05/24/20 0256  WBC 6.0 7.8 6.0 5.0 4.5  NEUTROABS 3.2  --   --  2.8 2.2  HGB 14.8 13.7 13.1 12.3* 12.5*  HCT 43.5 39.0 38.1* 36.7* 36.9*  MCV 102.1* 99.0 101.3* 101.4* 103.1*  PLT 365 340 291 229 779    Basic Metabolic Panel: Recent Labs  Lab 05/20/20 1304 05/21/20 2124 05/22/20 0403 05/23/20 0301 05/24/20 0256  NA 135 130* 135 136 137  K 4.3 3.8 4.1 3.9 3.6  CL 101 95* 102 102 102  CO2 28 23 21* 26 27  GLUCOSE 113* 104* 84 87 110*  BUN 7* 8 7* 8 <5*  CREATININE 0.71 0.87 0.75 0.71 0.80  CALCIUM 8.9 8.6* 8.4* 8.4* 8.5*  MG  --   --   --  1.9 1.8    GFR: Estimated Creatinine Clearance: 104 mL/min (by C-G formula based on SCr of 0.8 mg/dL).  Liver Function Tests: Recent Labs  Lab 05/20/20 1304 05/21/20 2124 05/22/20 0403 05/23/20 0301 05/24/20 0256  AST 23 28 26 21 20   ALT 16 20 17 14 14   ALKPHOS 73 65 61 60 58  BILITOT 0.7 0.7 0.5 0.9 0.7  PROT 7.2 7.2 6.4* 5.7* 5.8*  ALBUMIN 3.7 3.9 3.4* 3.1* 3.1*    CBG: No results for input(s): GLUCAP in the last 168 hours.   Recent Results (from the past 240 hour(s))  Resp Panel by RT-PCR (Flu A&B, Covid) Nasopharyngeal Swab     Status: None   Collection Time: 05/22/20  1:03 PM   Specimen: Nasopharyngeal Swab; Nasopharyngeal(NP) swabs in vial transport medium  Result Value Ref Range Status   SARS Coronavirus 2 by RT PCR NEGATIVE NEGATIVE Final    Comment: (NOTE) SARS-CoV-2 target nucleic acids are NOT DETECTED.  The SARS-CoV-2 RNA is generally detectable in upper respiratory specimens during the acute phase of infection. The lowest concentration of SARS-CoV-2 viral copies this assay can detect is 138 copies/mL. A negative result does not preclude SARS-Cov-2 infection and should not be used as the sole basis for treatment or other  patient management decisions. A negative result may occur with  improper specimen collection/handling, submission of specimen other than nasopharyngeal swab, presence of viral mutation(s) within the areas targeted by this assay, and inadequate number of viral copies(<138 copies/mL). A negative result must be combined with clinical observations, patient history, and epidemiological information. The expected result is Negative.  Fact Sheet for Patients:  EntrepreneurPulse.com.au  Fact Sheet for Healthcare Providers:  IncredibleEmployment.be  This test is no t yet approved or cleared by the [place] FDA and  has been authorized for detection and/or diagnosis of SARS-CoV-2 by FDA under an Emergency Use Authorization (EUA). This EUA will remain  in effect (meaning this test can be used) for the duration of the COVID-19 declaration under Section 564(b)(1) of the Act, 21 U.S.C.section 360bbb-3(b)(1), unless the authorization is terminated  or revoked sooner.       Influenza A by PCR NEGATIVE NEGATIVE Final   Influenza B by PCR NEGATIVE NEGATIVE Final    Comment: (NOTE) The Xpert Xpress SARS-CoV-2/FLU/RSV plus assay is intended as an aid in the diagnosis of influenza from Nasopharyngeal swab specimens and should not be used as a sole basis for treatment. Nasal washings and aspirates are unacceptable for Xpert Xpress SARS-CoV-2/FLU/RSV testing.  Fact Sheet for Patients: EntrepreneurPulse.com.au  Fact Sheet for Healthcare Providers: IncredibleEmployment.be  This test is not yet approved or cleared by the [place] FDA and has been authorized for detection and/or diagnosis of SARS-CoV-2 by FDA under an Emergency Use Authorization (EUA). This EUA will remain in effect (meaning this test can be used) for the duration of the COVID-19 declaration under Section 564(b)(1) of the Act, 21 U.S.C. section  360bbb-3(b)(1), unless the authorization is terminated or revoked.  Performed at [facility], [place] [address], [place], [place] [address]          Radiology Studies: MR 3D Recon At Scanner  Result Date: 05/22/2020 CLINICAL DATA:  Inpatient. Cholelithiasis and right upper quadrant abdominal pain. EXAM: MRI ABDOMEN WITHOUT AND WITH CONTRAST (INCLUDING MRCP) TECHNIQUE: Multiplanar multisequence MR imaging of the abdomen was performed both before and after the administration of intravenous contrast. Heavily T2-weighted images of the biliary and pancreatic ducts were obtained, and three-dimensional MRCP images were rendered by post processing. CONTRAST:  20mL GADAVIST GADOBUTROL 1 MMOL/ML IV SOLN COMPARISON:  05/21/2020 abdominal sonogram.  12/02/2019 MRI abdomen. FINDINGS: Lower chest: No acute abnormality at the lung bases. Hepatobiliary: Normal liver size and configuration. Mild-to-moderate diffuse hepatic steatosis. Tiny simple 0.3 cm inferior right liver cyst. No suspicious liver masses. Nondistended gallbladder. Minimal layering sludge in the fundal gallbladder.  No gallbladder wall thickening. No pericholecystic fluid. No discrete gallstones in the gallbladder. No biliary ductal dilatation. Common bile duct diameter 8 mm, mildly dilated. Somewhat sharp tapering of the common bile duct at the level of the pancreatic head. No biliary filling defects to suggest choledocholithiasis. No beading of the bile ducts. Pancreas: There is generalized enlargement and edema of the pancreatic head and neck with associated mild peripancreatic and periduodenal edema centered in the pancreaticoduodenal groove (series 3/image 18). 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 The main pancreatic duct is mildly dilated (4 mm diameter) and irregular with caliber transition at the level of the pancreatic head. There is a complex 2.3 x 1.6 cm cystic mass in  the pancreatic head (series 3/image 21) with mixed signal intensity, mild wall thickening and enhancement and no internal solid enhancement, increased from 1.4 x 1.3 cm on 12/02/2019 MRI. Preserved pancreatic parenchymal enhancement. Spleen: Normal size. No mass. Adrenals/Urinary Tract: Normal adrenals. No hydronephrosis. Normal kidneys with no renal mass. Stomach/Bowel: Normal non-distended stomach. Mild wall thickening with surrounding edema in the descending duodenum, probably reactive. Visualized small and large bowel is normal caliber, with no bowel wall thickening. Vascular/Lymphatic: Atherosclerotic nonaneurysmal abdominal aorta. Patent portal, splenic, hepatic and renal veins. No pathologically enlarged lymph nodes in the abdomen. Other: No abdominal ascites or focal fluid collection. Musculoskeletal: No aggressive appearing focal osseous lesions. IMPRESSION: 1. 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 2. Double duct sign is present with mildly dilated (8 mm diameter) common bile duct and mildly dilated (4 mm diameter) irregular main pancreatic duct, both demonstrating somewhat abrupt caliber transitions at the level of the pancreatic head. An occult pancreatic head neoplasm is very difficult to exclude. As such, ERCP with endoscopic ultrasound is suggested when clinically feasible. Short-term follow-up MRI abdomen without and with IV contrast is also recommended in 3 months. 3. No appreciable gallstones. Minimal gallbladder sludge. No choledocholithiasis. 4. Mild-to-moderate diffuse hepatic steatosis. Electronically Signed   By: Ilona Sorrel M.D.   On: 05/22/2020 10:54   MR ABDOMEN MRCP W WO CONTAST  Result Date: 05/22/2020 CLINICAL DATA:  Inpatient. Cholelithiasis and right upper quadrant abdominal pain. EXAM: MRI ABDOMEN WITHOUT AND WITH CONTRAST (INCLUDING MRCP)  TECHNIQUE: Multiplanar multisequence MR imaging of the abdomen was performed both before and after the administration of intravenous contrast. Heavily T2-weighted images of the biliary and pancreatic ducts were obtained, and three-dimensional MRCP images were rendered by post processing. CONTRAST:  61mL GADAVIST GADOBUTROL 1 MMOL/ML IV SOLN COMPARISON:  05/21/2020 abdominal sonogram.  12/02/2019 MRI abdomen. FINDINGS: Lower chest: No acute abnormality at the lung bases. Hepatobiliary: Normal liver size and configuration. Mild-to-moderate diffuse hepatic steatosis. Tiny simple 0.3 cm inferior right liver cyst. No suspicious liver masses. Nondistended gallbladder. Minimal layering sludge in the fundal gallbladder. No gallbladder wall thickening. No pericholecystic fluid. No discrete gallstones in the gallbladder. No biliary ductal dilatation. Common bile duct diameter 8 mm, mildly dilated. Somewhat sharp tapering of the common bile duct at the level of the pancreatic head. No biliary filling defects to suggest choledocholithiasis. No beading of the bile ducts. Pancreas: There is generalized enlargement and edema of the pancreatic head and neck with associated mild peripancreatic and periduodenal edema centered in the pancreaticoduodenal groove (series 3/image 18). 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 The main pancreatic duct is  mildly dilated (4 mm diameter) and irregular with caliber transition at the level of the pancreatic head. There is a complex 2.3 x 1.6 cm cystic mass in the pancreatic head (series 3/image 21) with mixed signal intensity, mild wall thickening and enhancement and no internal solid enhancement, increased from 1.4 x 1.3 cm on 12/02/2019 MRI. Preserved pancreatic parenchymal enhancement. Spleen: Normal size. No mass. Adrenals/Urinary Tract: Normal adrenals. No hydronephrosis. Normal kidneys with no renal mass. Stomach/Bowel: Normal  non-distended stomach. Mild wall thickening with surrounding edema in the descending duodenum, probably reactive. Visualized small and large bowel is normal caliber, with no bowel wall thickening. Vascular/Lymphatic: Atherosclerotic nonaneurysmal abdominal aorta. Patent portal, splenic, hepatic and renal veins. No pathologically enlarged lymph nodes in the abdomen. Other: No abdominal ascites or focal fluid collection. Musculoskeletal: No aggressive appearing focal osseous lesions. IMPRESSION: 1. 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 2. Double duct sign is present with mildly dilated (8 mm diameter) common bile duct and mildly dilated (4 mm diameter) irregular main pancreatic duct, both demonstrating somewhat abrupt caliber transitions at the level of the pancreatic head. An occult pancreatic head neoplasm is very difficult to exclude. As such, ERCP with endoscopic ultrasound is suggested when clinically feasible. Short-term follow-up MRI abdomen without and with IV contrast is also recommended in 3 months. 3. No appreciable gallstones. Minimal gallbladder sludge. No choledocholithiasis. 4. Mild-to-moderate diffuse hepatic steatosis. Electronically Signed   By: Ilona Sorrel M.D.   On: 05/22/2020 10:54        Scheduled Meds: . amLODipine  10 mg Oral Daily  . mometasone-formoterol  2 puff Inhalation BID  . tiotropium  18 mcg Inhalation Daily   Continuous Infusions: . lactated ringers 75 mL/hr at 05/23/20 2214  . magnesium sulfate bolus IVPB    . piperacillin-tazobactam 3.375 g (05/24/20 0547)     LOS: 2 days    Time spent: 35 minutes    Irine Seal, MD [facility]   To contact the attending provider between 7A-7P or the covering provider during after hours 7P-7A, please log into the web site [email or web address] and access using universal [facility] password for that web site. If you do not have the password, please call the hospital operator.  05/24/2020, 8:52 AM

## 2020-05-24 NOTE — Transfer of Care (Signed)
Immediate Anesthesia Transfer of Care Note  Patient: Justin Suarez  Procedure(s) Performed: LAPAROSCOPIC CHOLECYSTECTOMY WITH INTRAOPERATIVE CHOLANGIOGRAM (N/A Abdomen)  Patient Location: PACU  Anesthesia Type:General  Level of Consciousness: awake, alert  and oriented  Airway & Oxygen Therapy: Patient Spontanous Breathing and Patient connected to face mask oxygen  Post-op Assessment: Report given to RN and Post -op Vital signs reviewed and stable  Post vital signs: Reviewed and stable  Last Vitals:  Vitals Value Taken Time  BP 141/82 05/24/20 1425  Temp    Pulse 75 05/24/20 1426  Resp 15 05/24/20 1426  SpO2 100 % 05/24/20 1426  Vitals shown include unvalidated device data.  Last Pain:  Vitals:   05/24/20 1132  TempSrc:   PainSc: 0-No pain      Patients Stated Pain Goal: 0 (84/21/03 1281)  Complications: No complications documented.

## 2020-05-24 NOTE — Progress Notes (Signed)
Patient ambulating several times around unit in the hallway.  Steady gait noted with no use of assistive devices.

## 2020-05-24 NOTE — Op Note (Signed)
Justin Suarez [id number] 01/05/1956 05/24/2020  Laparoscopic Cholecystectomy with IOC Procedure Note  Indications: This patient is a 65yo M with h/o recurrent pancreatitis, who was admitted to the hospital with signs and symptoms of pancreatitis. Decision was made to proceed with laparoscopic cholecystectomy.  Pre-operative Diagnosis: c/f gallstone pancreatitis  Post-operative Diagnosis: same  Surgeon: Greer Pickerel, MD  Assistants: Caryn Bee, MD  Anesthesia: General endotracheal anesthesia  ASA Class: 3  Procedure Details  The patient was seen again in the Holding Room. The risks, benefits, complications, treatment options, and expected outcomes were discussed with the patient. The possibilities of reaction to medication, pulmonary aspiration, perforation of viscus, bleeding, recurrent infection, finding a normal gallbladder, the need for additional procedures, failure to diagnose a condition, the possible need to convert to an open procedure, and creating a complication requiring transfusion or operation were discussed with the patient. The likelihood of improving the patient's symptoms with return to their baseline status is good.  The patient and/or family concurred with the proposed plan, giving informed consent. The site of surgery properly noted. The patient was taken to Operating Room, identified as YAHSIR WICKENS and the procedure verified as Laparoscopic Cholecystectomy with Intraoperative Cholangiogram. A Time Out was held and the above information confirmed. Antibiotic prophylaxis was administered.   Prior to the induction of general anesthesia, antibiotic prophylaxis was administered. General endotracheal anesthesia was then administered and tolerated well. After the induction, the abdomen was prepped with Chloraprep and draped in the sterile fashion. The patient was positioned in the supine position.  Local anesthetic agent was injected into the skin near the umbilicus and  an incision made. We dissected down to the abdominal fascia with blunt dissection.  The fascia was incised vertically and we entered the peritoneal cavity bluntly.  A pursestring suture of 0-Vicryl was placed around the fascial opening.  The Hasson cannula was inserted and secured with the stay suture.  Pneumoperitoneum was then created with CO2 and tolerated well without any adverse changes in the patient's vital signs. An 11-mm port was placed in the subxiphoid position.  Two 5-mm ports were placed in the right upper quadrant. All skin incisions were infiltrated with a local anesthetic agent before making the incision and placing the trocars.   We positioned the patient in reverse Trendelenburg, tilted slightly to the patient's left.  The gallbladder was identified, the fundus grasped and retracted cephalad. Adhesions were lysed bluntly and with the electrocautery where indicated, taking care not to injure any adjacent organs or viscus. The infundibulum was grasped and retracted laterally, exposing the peritoneum overlying the triangle of Calot. This was then divided and exposed in a blunt fashion. A critical view of the cystic duct and cystic artery was obtained.  The cystic duct was clearly identified and bluntly dissected circumferentially. The cystic duct was ligated with a clip distally.   An incision was made in the cystic duct and the [facility] cholangiogram catheter introduced. The catheter was secured using a clip. A cholangiogram was then obtained which showed good visualization of the distal and proximal biliary tree with no sign of filling defects or obstruction.  Contrast flowed easily into the duodenum. The catheter was then removed.   The cystic duct was then ligated with clips and divided. The cystic artery which had been identified & dissected free was ligated with clips and divided as well.   The gallbladder was dissected from the liver bed in retrograde fashion with the electrocautery. The  gallbladder was removed  and placed in an Ecco sac.  The gallbladder and Ecco sac were then removed through the umbilical port site. The liver bed was irrigated and inspected. Hemostasis was achieved with the electrocautery. Copious irrigation was utilized and was repeatedly aspirated until clear.  The pursestring suture was used to close the umbilical fascia. The fascia at the epigastric port site was closed with a single 0 Vicryl stitch placed with the suture passer.  We again inspected the right upper quadrant for hemostasis.  The umbilical closure was inspected and there was no air leak and nothing trapped within the closure. Pneumoperitoneum was released as we removed the trocars.  4-0 Monocryl was used to close the skin.   Steri-strips, and clean dressings were applied. The patient was then extubated and brought to the recovery room in stable condition. Instrument, sponge, and needle counts were correct at closure and at the conclusion of the case.   Findings: Pancreatitis, cholelithiasis  Estimated Blood Loss: Minimal         Drains: None         Specimens: Gallbladder           Complications: None; patient tolerated the procedure well.         Disposition: PACU - hemodynamically stable.         Condition: stable  Leighton Ruff. Redmond Pulling, MD, FACS General, Bariatric, & Minimally Invasive Surgery [facility], [place]

## 2020-05-24 NOTE — Progress Notes (Addendum)
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    IFebruary 04/01/2020 he underwent EUS/ERCP which revealed findings consistent with chronic pancreatitis and pancreatic pseudocyst, no gallstones or CBD stones.  He was referred to general surgery with persistent right upper quadrant abdominal pain which was felt to be due to gallbladder disease.  He was scheduled for a laparoscopic cholecystectomy 05/24/2020, however, he developed severe upper abdominal pain with N/V and he was admitted to [facility].  He was diagnosed with acute gallstone pancreatitis.He was seen by Dr. Lyndel Safe from our GI service on 05/22/2020 who assessed his RUQ pain was likely biliary and an MRCP was negative for choledocholithiasis therefore preop ERCP was not warranted. Patient was to proceed with a laparoscopic cholecystectomy with IOC when medically stable and to repeat a pancreatic MRI in 3 months.   A re-GI consult was requested today by Dr. 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 His laparoscopic cholecystectomy was cancelled until further GI evaluation.   Abdominal MRI/MRCP W/WO contrast 05/22/2020:  1. 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 2. Double duct sign is present with mildly dilated (8 mm diameter) common bile duct and mildly dilated (4 mm diameter) irregular main pancreatic duct, both demonstrating somewhat abrupt caliber transitions at the level of the pancreatic head. An occult pancreatic head neoplasm is very difficult to exclude. As  such, ERCP with endoscopic ultrasound is suggested when clinically feasible. Short-term follow-up MRI abdomen without and with IV contrast is also recommended in 3 months. 3. No appreciable gallstones. Minimal gallbladder sludge. No choledocholithiasis. 4. Mild-to-moderate diffuse hepatic steatosis  EUS 04/01/2020: - Findings consistent with chronic pancreatitis, probably etoh related given his former heavy alcohol abuse (a case of beer daily for many years, much less lately). The changes of chronic pancreatitis derease the ability of imaging tests (including this EUS) to detect neoplastic growths. - Cystic lesion in uncinate pancreas without concerning morophologic features on this exam or recent MRI. This is probably a pseudocyst, I recommend repeat MRI in 2 years to ensure stability. - No gallstones or CBD stones but his gallbladder is thickwalled. His symptoms are fairly classic for biliary colic.   Subjective: Justin Suarez feels well today. No N/V or abdominal pain x 2 days. He passed 2 to 3 loose golden stools last night. Last alcohol intake (1 beer) was on Friday 4/1.   Review of systems. See HPI, all other systems reviewed and are negative   Objective:  Vital signs in last 24 hours: Temp:  [98.2 F (36.8 C)] 98.2 F (36.8 C) (04/04 0420) Pulse Rate:  [65-75] 65 (04/04 0420) Resp:  [16-20] 16 (04/04 0420) BP: (138-163)/(77-82) 138/82 (04/04 0420) SpO2:  [98 %-100 %] 98 % (04/04 0420) Last BM Date: 05/23/20   General:   Alert 65 year old male in NAD.  Eyes: No scleral icterus. PERRLA. Heart: RRR, no murmur.  Pulm:  Breath sounds clear throughout.  Abdomen: Soft, nondistended. Nontender. + BS x 4 quads. No mass.  Extremities:  Without edema. Neurologic:  Alert and  oriented x4;  grossly normal neurologically. Psych:  Alert and cooperative. Normal  mood and affect.   Intake/Output from previous day: 04/03 0701 - 04/04 0700 In: 1440 [P.O.:1440] Out: -  Intake/Output  this shift: No intake/output data recorded.  Lab Results: Recent Labs    05/22/20 0403 05/23/20 0301 05/24/20 0256  WBC 6.0 5.0 4.5  HGB 13.1 12.3* 12.5*  HCT 38.1* 36.7* 36.9*  PLT 291 229 209   BMET Recent Labs    05/22/20 0403 05/23/20 0301 05/24/20 0256  NA 135 136 137  K 4.1 3.9 3.6  CL 102 102 102  CO2 21* 26 27  GLUCOSE 84 87 110*  BUN 7* 8 <5*  CREATININE 0.75 0.71 0.80  CALCIUM 8.4* 8.4* 8.5*   LFT Recent Labs    05/24/20 0256  PROT 5.8*  ALBUMIN 3.1*  AST 20  ALT 14  ALKPHOS 58  BILITOT 0.7   PT/INR No results for input(s): LABPROT, INR in the last 72 hours. Hepatitis Panel No results for input(s): HEPBSAG, HCVAB, HEPAIGM, HEPBIGM in the last 72 hours.  MR 3D Recon At Scanner  Result Date: 05/22/2020 CLINICAL DATA:  Inpatient. Cholelithiasis and right upper quadrant abdominal pain. EXAM: MRI ABDOMEN WITHOUT AND WITH CONTRAST (INCLUDING MRCP) TECHNIQUE: Multiplanar multisequence MR imaging of the abdomen was performed both before and after the administration of intravenous contrast. Heavily T2-weighted images of the biliary and pancreatic ducts were obtained, and three-dimensional MRCP images were rendered by post processing. CONTRAST:  63mL GADAVIST GADOBUTROL 1 MMOL/ML IV SOLN COMPARISON:  05/21/2020 abdominal sonogram.  12/02/2019 MRI abdomen. FINDINGS: Lower chest: No acute abnormality at the lung bases. Hepatobiliary: Normal liver size and configuration. Mild-to-moderate diffuse hepatic steatosis. Tiny simple 0.3 cm inferior right liver cyst. No suspicious liver masses. Nondistended gallbladder. Minimal layering sludge in the fundal gallbladder. No gallbladder wall thickening. No pericholecystic fluid. No discrete gallstones in the gallbladder. No biliary ductal dilatation. Common bile duct diameter 8 mm, mildly dilated. Somewhat sharp tapering of the common bile duct at the level of the pancreatic head. No biliary filling defects to suggest  choledocholithiasis. No beading of the bile ducts. Pancreas: There is generalized enlargement and edema of the pancreatic head and neck with associated mild peripancreatic and periduodenal edema centered in the pancreaticoduodenal groove (series 3/image 18). 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 The main pancreatic duct is mildly dilated (4 mm diameter) and irregular with caliber transition at the level of the pancreatic head. There is a complex 2.3 x 1.6 cm cystic mass in the pancreatic head (series 3/image 21) with mixed signal intensity, mild wall thickening and enhancement and no internal solid enhancement, increased from 1.4 x 1.3 cm on 12/02/2019 MRI. Preserved pancreatic parenchymal enhancement. Spleen: Normal size. No mass. Adrenals/Urinary Tract: Normal adrenals. No hydronephrosis. Normal kidneys with no renal mass. Stomach/Bowel: Normal non-distended stomach. Mild wall thickening with surrounding edema in the descending duodenum, probably reactive. Visualized small and large bowel is normal caliber, with no bowel wall thickening. Vascular/Lymphatic: Atherosclerotic nonaneurysmal abdominal aorta. Patent portal, splenic, hepatic and renal veins. No pathologically enlarged lymph nodes in the abdomen. Other: No abdominal ascites or focal fluid collection. Musculoskeletal: No aggressive appearing focal osseous lesions. IMPRESSION: 1. 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 2. Double duct sign is present with mildly dilated (8 mm diameter) common bile duct and mildly dilated (4 mm diameter) irregular main pancreatic duct, both demonstrating somewhat abrupt caliber transitions  at the level of the pancreatic head. An occult pancreatic head neoplasm is very difficult to exclude. As such, ERCP with endoscopic ultrasound is  suggested when clinically feasible. Short-term follow-up MRI abdomen without and with IV contrast is also recommended in 3 months. 3. No appreciable gallstones. Minimal gallbladder sludge. No choledocholithiasis. 4. Mild-to-moderate diffuse hepatic steatosis. Electronically Signed   By: Ilona Sorrel M.D.   On: 05/22/2020 10:54   MR ABDOMEN MRCP W WO CONTAST  Result Date: 05/22/2020 CLINICAL DATA:  Inpatient. Cholelithiasis and right upper quadrant abdominal pain. EXAM: MRI ABDOMEN WITHOUT AND WITH CONTRAST (INCLUDING MRCP) TECHNIQUE: Multiplanar multisequence MR imaging of the abdomen was performed both before and after the administration of intravenous contrast. Heavily T2-weighted images of the biliary and pancreatic ducts were obtained, and three-dimensional MRCP images were rendered by post processing. CONTRAST:  51mL GADAVIST GADOBUTROL 1 MMOL/ML IV SOLN COMPARISON:  05/21/2020 abdominal sonogram.  12/02/2019 MRI abdomen. FINDINGS: Lower chest: No acute abnormality at the lung bases. Hepatobiliary: Normal liver size and configuration. Mild-to-moderate diffuse hepatic steatosis. Tiny simple 0.3 cm inferior right liver cyst. No suspicious liver masses. Nondistended gallbladder. Minimal layering sludge in the fundal gallbladder. No gallbladder wall thickening. No pericholecystic fluid. No discrete gallstones in the gallbladder. No biliary ductal dilatation. Common bile duct diameter 8 mm, mildly dilated. Somewhat sharp tapering of the common bile duct at the level of the pancreatic head. No biliary filling defects to suggest choledocholithiasis. No beading of the bile ducts. Pancreas: There is generalized enlargement and edema of the pancreatic head and neck with associated mild peripancreatic and periduodenal edema centered in the pancreaticoduodenal groove (series 3/image 18). 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 The main pancreatic duct is mildly  dilated (4 mm diameter) and irregular with caliber transition at the level of the pancreatic head. There is a complex 2.3 x 1.6 cm cystic mass in the pancreatic head (series 3/image 21) with mixed signal intensity, mild wall thickening and enhancement and no internal solid enhancement, increased from 1.4 x 1.3 cm on 12/02/2019 MRI. Preserved pancreatic parenchymal enhancement. Spleen: Normal size. No mass. Adrenals/Urinary Tract: Normal adrenals. No hydronephrosis. Normal kidneys with no renal mass. Stomach/Bowel: Normal non-distended stomach. Mild wall thickening with surrounding edema in the descending duodenum, probably reactive. Visualized small and large bowel is normal caliber, with no bowel wall thickening. Vascular/Lymphatic: Atherosclerotic nonaneurysmal abdominal aorta. Patent portal, splenic, hepatic and renal veins. No pathologically enlarged lymph nodes in the abdomen. Other: No abdominal ascites or focal fluid collection. Musculoskeletal: No aggressive appearing focal osseous lesions. IMPRESSION: 1. 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 2. Double duct sign is present with mildly dilated (8 mm diameter) common bile duct and mildly dilated (4 mm diameter) irregular main pancreatic duct, both demonstrating somewhat abrupt caliber transitions at the level of the pancreatic head. An occult pancreatic head neoplasm is very difficult to exclude. As such, ERCP with endoscopic ultrasound is suggested when clinically feasible. Short-term follow-up MRI abdomen without and with IV contrast is also recommended in 3 months. 3. No appreciable gallstones. Minimal gallbladder sludge. No choledocholithiasis. 4. Mild-to-moderate diffuse hepatic steatosis. Electronically Signed   By: Ilona Sorrel M.D.   On: 05/22/2020 10:54    Assessment / Plan:  58. 65 year old male with a  history of chronic pancreatitis with a cystic lesion to the ucinate process per  EUS 03/2020  (likely due to alcohol use) admitted to the hospital 05/21/2020 with acute gallstone pancreatitis. 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 CBD and pancreatic duct dilated. LFTs normal. Lipase level 40.  Further GI evaluation was requested prior to proceeding with a cholecystectomy. He is afebrile. Hemodynamically stable.  -Dr. Ardis Hughs to review MRI/MRCP, await his recommendations prior to patient proceeding with a laparoscopic cholecystectomy  -Continue Zosyn IV for now  2. Hepatic steatosis      LOS: 2 days   Noralyn Pick  05/24/2020, 8:39 AM   ________________________________________________________________________  Velora Heckler GI MD note:  I personally examined the patient, reviewed the data and agree with the assessment and plan described above.  EUS 2 months ago, I felt he had etoh complications of chronic pancreatitis (he was a very heavy drinker for years). He still drinks about 1 beer daily. I communicated with Dr. Redmond Pulling that it was OK to proceed with lap chole. Patient is about 2 hours post op and he's walking laps in the halls already, asking for solid food.  [facility] will arrange follow up appt in 4-6 weeks with Dr. Henrene Pastor or extender to see how he is recovering.  Will decide at that point about repeat imaging of the pancreas prior to the 2 year interval study I recommended at time of EUS 03/2020.   Owens Loffler, MD [facility] Pager [phone]

## 2020-05-24 NOTE — Anesthesia Postprocedure Evaluation (Signed)
Anesthesia Post Note  Patient: Justin Suarez  Procedure(s) Performed: LAPAROSCOPIC CHOLECYSTECTOMY WITH INTRAOPERATIVE CHOLANGIOGRAM (N/A Abdomen)     Patient location during evaluation: PACU Anesthesia Type: General Level of consciousness: awake and alert Pain management: pain level controlled Vital Signs Assessment: post-procedure vital signs reviewed and stable Respiratory status: spontaneous breathing, nonlabored ventilation, respiratory function stable and patient connected to nasal cannula oxygen Cardiovascular status: blood pressure returned to baseline and stable Postop Assessment: no apparent nausea or vomiting Anesthetic complications: no   No complications documented.  Last Vitals:  Vitals:   05/24/20 1425 05/24/20 1430  BP: (!) 141/82 126/74  Pulse: 74 65  Resp: 14 14  Temp: 36.6 C   SpO2: 100% 100%    Last Pain:  Vitals:   05/24/20 1430  TempSrc:   PainSc: 0-No pain                 Barnet Glasgow

## 2020-05-24 NOTE — Anesthesia Procedure Notes (Signed)
Procedure Name: Intubation Performed by: Kishon Garriga J, CRNA Pre-anesthesia Checklist: Patient identified, Emergency Drugs available, Suction available, Patient being monitored and Timeout performed Patient Re-evaluated:Patient Re-evaluated prior to induction Oxygen Delivery Method: Circle system utilized Preoxygenation: Pre-oxygenation with 100% oxygen Induction Type: IV induction Ventilation: Mask ventilation without difficulty Laryngoscope Size: Mac and 4 Grade View: Grade I Tube type: Oral Tube size: 7.5 mm Number of attempts: 1 Airway Equipment and Method: Stylet Placement Confirmation: ETT inserted through vocal cords under direct vision,  positive ETCO2 and breath sounds checked- equal and bilateral Secured at: 23 cm Tube secured with: Tape Dental Injury: Teeth and Oropharynx as per pre-operative assessment        

## 2020-05-25 ENCOUNTER — Encounter: Payer: Self-pay | Admitting: Internal Medicine

## 2020-05-25 ENCOUNTER — Encounter (HOSPITAL_COMMUNITY): Payer: Self-pay | Admitting: General Surgery

## 2020-05-25 DIAGNOSIS — K8500 Idiopathic acute pancreatitis without necrosis or infection: Secondary | ICD-10-CM | POA: Diagnosis not present

## 2020-05-25 DIAGNOSIS — I10 Essential (primary) hypertension: Secondary | ICD-10-CM | POA: Diagnosis not present

## 2020-05-25 DIAGNOSIS — E860 Dehydration: Secondary | ICD-10-CM | POA: Diagnosis not present

## 2020-05-25 DIAGNOSIS — J449 Chronic obstructive pulmonary disease, unspecified: Secondary | ICD-10-CM | POA: Diagnosis not present

## 2020-05-25 LAB — COMPREHENSIVE METABOLIC PANEL
ALT: 18 U/L (ref 0–44)
AST: 27 U/L (ref 15–41)
Albumin: 3.4 g/dL — ABNORMAL LOW (ref 3.5–5.0)
Alkaline Phosphatase: 59 U/L (ref 38–126)
Anion gap: 8 (ref 5–15)
BUN: 8 mg/dL (ref 8–23)
CO2: 25 mmol/L (ref 22–32)
Calcium: 8.8 mg/dL — ABNORMAL LOW (ref 8.9–10.3)
Chloride: 104 mmol/L (ref 98–111)
Creatinine, Ser: 0.89 mg/dL (ref 0.61–1.24)
GFR, Estimated: >60 mL/min (ref >60)
Glucose, Bld: 161 mg/dL — ABNORMAL HIGH (ref 70–99)
Potassium: 4 mmol/L (ref 3.5–5.1)
Sodium: 137 mmol/L (ref 135–145)
Total Bilirubin: 0.6 mg/dL (ref 0.3–1.2)
Total Protein: 6.3 g/dL — ABNORMAL LOW (ref 6.5–8.1)

## 2020-05-25 LAB — CBC WITH DIFFERENTIAL/PLATELET
Abs Immature Granulocytes: 0.03 10*3/uL (ref 0.00–0.07)
Basophils Absolute: 0 10*3/uL (ref 0.0–0.1)
Basophils Relative: 0 %
Eosinophils Absolute: 0 10*3/uL (ref 0.0–0.5)
Eosinophils Relative: 0 %
HCT: 38.9 % — ABNORMAL LOW (ref 39.0–52.0)
Hemoglobin: 12.9 g/dL — ABNORMAL LOW (ref 13.0–17.0)
Immature Granulocytes: 0 %
Lymphocytes Relative: 8 %
Lymphs Abs: 0.5 10*3/uL — ABNORMAL LOW (ref 0.7–4.0)
MCH: 34.3 pg — ABNORMAL HIGH (ref 26.0–34.0)
MCHC: 33.2 g/dL (ref 30.0–36.0)
MCV: 103.5 fL — ABNORMAL HIGH (ref 80.0–100.0)
Monocytes Absolute: 0.3 10*3/uL (ref 0.1–1.0)
Monocytes Relative: 5 %
Neutro Abs: 5.9 10*3/uL (ref 1.7–7.7)
Neutrophils Relative %: 87 %
Platelets: 211 10*3/uL (ref 150–400)
RBC: 3.76 MIL/uL — ABNORMAL LOW (ref 4.22–5.81)
RDW: 12.8 % (ref 11.5–15.5)
WBC: 6.8 10*3/uL (ref 4.0–10.5)
nRBC: 0 % (ref 0.0–0.2)

## 2020-05-25 LAB — MAGNESIUM: Magnesium: 1.7 mg/dL (ref 1.7–2.4)

## 2020-05-25 MED ORDER — AMLODIPINE BESYLATE 5 MG PO TABS: 10.0000 mg | ORAL_TABLET | Freq: Every day | ORAL | Status: DC

## 2020-05-25 MED ORDER — MAGNESIUM SULFATE 4 GM/100ML IV SOLN
4.0000 g | Freq: Once | INTRAVENOUS | Status: AC
  Administered 2020-05-25: 4 g via INTRAVENOUS
  Filled 2020-05-25: qty 100

## 2020-05-25 MED ORDER — IBUPROFEN 600 MG PO TABS: 600.0000 mg | ORAL_TABLET | Freq: Three times a day (TID) | ORAL | 0 refills | Status: AC

## 2020-05-25 MED ORDER — PANTOPRAZOLE SODIUM 40 MG PO TBEC: 40.0000 mg | DELAYED_RELEASE_TABLET | Freq: Every day | ORAL | 1 refills | Status: DC

## 2020-05-25 MED ORDER — IPRATROPIUM-ALBUTEROL 20-100 MCG/ACT IN AERS: 2.0000 | INHALATION_SPRAY | Freq: Four times a day (QID) | RESPIRATORY_TRACT | 0 refills | Status: DC | PRN

## 2020-05-25 MED ORDER — TIOTROPIUM BROMIDE MONOHYDRATE 18 MCG IN CAPS: 18.0000 ug | ORAL_CAPSULE | Freq: Every day | RESPIRATORY_TRACT | 1 refills | Status: DC

## 2020-05-25 MED ORDER — MOMETASONE FURO-FORMOTEROL FUM 200-5 MCG/ACT IN AERO: 2.0000 | INHALATION_SPRAY | Freq: Two times a day (BID) | RESPIRATORY_TRACT | 1 refills | Status: DC

## 2020-05-25 MED ORDER — OXYCODONE HCL 5 MG PO TABS: 5.0000 mg | ORAL_TABLET | Freq: Four times a day (QID) | ORAL | 0 refills | Status: DC | PRN

## 2020-05-25 MED ORDER — ACETAMINOPHEN 500 MG PO TABS: 1000.0000 mg | ORAL_TABLET | Freq: Three times a day (TID) | ORAL | 0 refills | Status: AC

## 2020-05-25 NOTE — Progress Notes (Signed)
1 Day Post-Op   Subjective/Chief Complaint: Doing well Just some incisional pain Tolerated a diet without n/v   Objective: Vital signs in last 24 hours: Temp:  [97.6 F (36.4 C)-98.5 F (36.9 C)] 97.6 F (36.4 C) (04/05 0345) Pulse Rate:  [57-79] 69 (04/05 0345) Resp:  [14-19] 19 (04/05 0345) BP: (123-147)/(68-82) 147/80 (04/05 0345) SpO2:  [97 %-100 %] 98 % (04/05 0834) Weight:  [80.3 kg] 80.3 kg (04/04 1132) Last BM Date: 05/24/20  Intake/Output from previous day: 04/04 0701 - 04/05 0700 In: 1600 [I.V.:1500; IV Piggyback:100] Out: 25 [Blood:25] Intake/Output this shift: No intake/output data recorded.  Alert, nontoxic Smiling Soft, min TTP, some bruising around subxiphoid and umbilical incisions. O/w incisions ok No edema  Lab Results:  Recent Labs    05/24/20 0256 05/25/20 0348  WBC 4.5 6.8  HGB 12.5* 12.9*  HCT 36.9* 38.9*  PLT 209 211   BMET Recent Labs    05/24/20 0256 05/25/20 0348  NA 137 137  K 3.6 4.0  CL 102 104  CO2 27 25  GLUCOSE 110* 161*  BUN <5* 8  CREATININE 0.80 0.89  CALCIUM 8.5* 8.8*   PT/INR No results for input(s): LABPROT, INR in the last 72 hours. ABG No results for input(s): PHART, HCO3 in the last 72 hours.  Invalid input(s): PCO2, PO2  Studies/Results: DG Cholangiogram Operative  Result Date: 05/24/2020 CLINICAL DATA:  65 year old male with history of groove pancreatitis. EXAM: INTRAOPERATIVE CHOLANGIOGRAM TECHNIQUE: Cholangiographic images from the C-arm fluoroscopic device were submitted for interpretation post-operatively. Please see the procedural report for the amount of contrast and the fluoroscopy time utilized. COMPARISON:  MRCP from 05/22/2020 FINDINGS: Intraoperative antegrade injection via the cystic duct which opacifies the common bile duct and central portions of the intrahepatic biliary tree. Contrast is visualized flowing freely into the duodenum. There is mild irregular tapering of the distal common bile duct  with mild dilation of the common bile duct. No significant intra hepatic biliary ductal dilation. No apparent anomalous anatomical configuration of the biliary tree. IMPRESSION: No evidence of choledocholithiasis. 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 Electronically Signed   By: Ruthann Cancer MD   On: 05/24/2020 14:22    Anti-infectives: Anti-infectives (From admission, onward)   Start     Dose/Rate Route Frequency Ordered Stop   05/24/20 1145  cefoTEtan (CEFOTAN) 2 g in sodium chloride 0.9 % 100 mL IVPB        2 g 200 mL/hr over 30 Minutes Intravenous On call to O.R. 05/24/20 1046 05/24/20 1316   05/23/20 0200  cefTRIAXone (ROCEPHIN) 2 g in sodium chloride 0.9 % 100 mL IVPB  Status:  Discontinued        2 g 200 mL/hr over 30 Minutes Intravenous Every 24 hours 05/22/20 0242 05/22/20 0758   05/22/20 0845  piperacillin-tazobactam (ZOSYN) IVPB 3.375 g        3.375 g 12.5 mL/hr over 4 Hours Intravenous Every 8 hours 05/22/20 0758     05/22/20 0400  metroNIDAZOLE (FLAGYL) IVPB 500 mg  Status:  Discontinued        500 mg 100 mL/hr over 60 Minutes Intravenous Every 8 hours 05/22/20 0242 05/22/20 0758   05/22/20 0145  cefTRIAXone (ROCEPHIN) 2 g in sodium chloride 0.9 % 100 mL IVPB        2 g  200 mL/hr over 30 Minutes Intravenous  Once 05/22/20 0139 05/22/20 0246      Assessment/Plan: s/p Procedure(s): LAPAROSCOPIC CHOLECYSTECTOMY WITH INTRAOPERATIVE CHOLANGIOGRAM (N/A)  Discussed intraop findings - residual pancreatitis given swelling around duodenum. Irregular CBD prob from pancreatitis. D/w him he will need to f/u with dr Ardis Hughs in 3 months for repeat MRI to follow that panc head lesion/cyst  Ok for discharge Discussed discharge diet and instructions No abx Advised low fat diet for next week or  so Sent in pain RX  LOS: 3 days    Justin Suarez 05/25/2020

## 2020-05-25 NOTE — Discharge Summary (Signed)
Physician Discharge Summary  Justin Suarez:[id number] DOB: 12-17-1955 DOA: 05/21/2020  PCP: Cyndi Bender, PA-C  Admit date: 05/21/2020 Discharge date: 05/25/2020  Time spent: 60 minutes  Recommendations for Outpatient Follow-up:  1. Follow-up with Cyndi Bender, PA-C in 2 to 3 weeks.  On follow-up patient need a basic metabolic profile done to follow-up on electrolytes and renal function.  Patient COPD will need to be followed up upon as patient was discharged on Dulera, Spiriva and Combivent. 2. Follow-up with Dr. Redmond Pulling, general surgery in 3 weeks for postop check. 3. Follow-up with Dr. Scarlette Shorts, gastroenterology on 07/01/2020 at 3:20 PM.  On follow-up will be determined when patient needs repeat MRI to follow-up on pancreatic head lesion/pseudocyst.   Discharge Diagnoses:  Principal Problem:   Acute pancreatitis Active Problems:   COPD (chronic obstructive pulmonary disease) ([place])   Hyponatremia   Essential hypertension   Gallstone pancreatitis   RUQ pain   Dehydration   Discharge Condition: Stable and improved  Diet recommendation: Low-fat diet/heart healthy  Filed Weights   05/21/20 2117 05/24/20 1132  Weight: 80.3 kg 80.3 kg    History of present illness:  HPI per Dr. Marlowe Sax : Justin Suarez is a 65 y.o. 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  Patient was referred to general surgery and his right upper quadrant abdominal pain was felt to be consistent with gallbladder disease.  Recommendation was to pursue cholecystectomy.  Patient reported  48-month history of intermittent right upper quadrant abdominal pain associated with eating.  Stated he was recently seen by Dr. Redmond Pulling from general surgery and was scheduled to get his gallbladder removed on Monday, 05/24/2020.  For the past 4 days his pain had become severe.  Anytime he tried to eat, he got severe sharp pain in his right upper quadrant and starts vomiting.  Also endorsing epigastric discomfort.  He has not been able to eat for the past few days.  Denied fevers or chills.  Denied heavy alcohol use.  Denied cough, shortness of breath, or chest pain.  ED Course: Vital signs stable.  Labs showing WBC 7.8, hemoglobin 13.7, platelet count 340K.  Sodium 130, potassium 3.8, chloride 95, bicarb 23, BUN 8, creatinine 0.8, glucose 104.  Lipase elevated at 2206.  LFTs normal.  Chest x-ray showing no active disease.  Right upper quadrant ultrasound showing tiny echogenic foci in the gallbladder, may represent stones or tumefactive sludge.  Murphy sign positive, however, no gallbladder wall thickening or edema seen on ultrasound.  ED physician discussed the case with Dr. Lyndel Safe from [facility] GI who recommended treating with antibiotics and obtaining MRCP in the morning.  GI will see the patient in the morning. Patient was given morphine, Zofran, and ceftriaxone.  Hospital Course:  1 colicky right upper quadrant pain/epigastric pain/probable gallstone pancreatitis -Likely concerning for symptomatic gallstone pancreatitis and probable acute cholecystitis. -Patient noted to have had a EUS/ERCP February 2022 with features of chronic pancreatitis, pseudocyst, no gallstones or CBD stones. -On presentation patient noted to have a lipase level of 2206 which has trended down to 40 with bowel rest, aggressive hydration. -Abdominal ultrasound done concerning  for acute cholecystitis. -Patient improved clinically during the hospitalization with no further bowel movement within 24 hours. Likely passed  gallstone. -MRCP/MRI abdomen done with complex abnormal appearance of pancreatic head and neck region suggestive of acute nonnecrotizing group pancreatitis with enlarging 2.3 cm pancreatic head pseudocyst, double duct sign with mildly dilated CBD and mildly dilated irregular main pancreatic duct, both demonstrating somewhat abrupt caliber transition at the level of pancreatic head.  Occult pancreatic head neoplasm very difficult to exclude.  No appreciable gallstones.  Minimal gallbladder sludge.  No choledocholithiasis.  Mild to moderate diffuse hepatic steatosis. -GI consulted, patient seen in consultation by Dr. Lyndel Safe, who feels patient's right upper quadrant pain likely biliary in nature, no choledocholithiasis, no need for preop ERCP unless positive IOC per GI. -It was noted the patient was scheduled to have a laparoscopic cholecystectomy with IOC  which was done without any complications on 10/26/930.   -Patient seen in consultation by general surgery who feel patient likely having a gallstone pancreatitis and recommended bowel rest, empiric IV antibiotics for possible cholecystitis, IV fluids and feel once symptoms have improved will likely benefit from cholecystectomy. -Patient improved clinically.  Patient maintained on IV Zosyn.  -GI was following but signed off on 05/22/2020. -Dr. Redmond Pulling with general surgery asked Dr. Ardis Hughs with GI to review chart, assess patient for noted enlarging pancreatic head mass which general surgery feel may be causing his symptoms. -GI weighed in and felt patient's symptoms were likely from biliary colic and recommended proceeding with laparoscopic cholecystectomy which patient underwent on 05/24/2020.   -Postoperatively patient was started on a diet which he tolerated with no further abdominal pain.  Patient improved clinically.   -Patient be discharged home in stable and improved condition and is to follow-up with general surgery in the outpatient setting.   -Patient  will also need follow-up with GI for repeat MRI for follow-up on pancreatic head lesion/pseudocyst.  2.  Probable acute gallstone pancreatitis See #1.  3.  Dehydration -Patient hydrated with IV fluids.  4.  Mild hyponatremia -Improved with hydration.  Outpatient follow-up.    5.  COPD -Stable. -Patient maintained on Dulera, Spiriva, nebs as needed during the hospitalization. -Patient be discharged home on Dulera and Spiriva as well as Combivent as needed.  -Tobacco cessation stressed to patient.  - Outpatient follow-up with PCP.    6.  Hypertension -Blood pressure remained controlled on home regimen of Norvasc 10 mg daily.  Outpatient follow-up with PCP.      Procedures:  Chest x-ray 05/21/2020  MRCP 05/22/2020  Abdominal ultrasound 05/21/2020  Laparoscopic cholecystectomy with IOC per Dr Schmitz/Dr. Chrisandra Netters surgery 05/24/2020  Consultations:  Gastroenterology: Dr. Lyndel Safe 05/22/2020  General surgery: Dr. Marlou Starks III 05/22/2020  Gastroenterology: Dr. Ardis Hughs 05/24/2020  Discharge Exam: Vitals:   05/25/20 0345 05/25/20 0834  BP: (!) 147/80   Pulse: 69   Resp: 19   Temp: 97.6 F (36.4 C)   SpO2: 98% 98%    General: NAD Cardiovascular: RRR Respiratory: CTAB  Discharge Instructions   Discharge Instructions    Diet - low sodium heart healthy   Complete by: As directed    Low fat diet   Increase activity slowly   Complete by: As directed      Allergies as of 05/25/2020   No Known Allergies     Medication List    STOP taking these medications   HYDROcodone-acetaminophen 5-325 MG tablet Commonly known as: NORCO/VICODIN     TAKE these medications   acetaminophen  500 MG tablet Commonly known as: TYLENOL Take 2 tablets (1,000 mg total) by mouth every 8 (eight) hours for 5 days.   amLODipine 5 MG tablet Commonly known as: NORVASC Take 2 tablets (10 mg total) by mouth daily. What changed: how much to take   ibuprofen 600 MG tablet Commonly known  as: ADVIL Take 1 tablet (600 mg total) by mouth 3 (three) times daily for 5 days.   Ipratropium-Albuterol 20-100 MCG/ACT Aers respimat Commonly known as: COMBIVENT Inhale 2 puffs into the lungs every 6 (six) hours as needed for wheezing or shortness of breath.   mometasone-formoterol 200-5 MCG/ACT Aero Commonly known as: DULERA Inhale 2 puffs into the lungs 2 (two) times daily.   ondansetron 4 MG tablet Commonly known as: ZOFRAN Take 4 mg by mouth 3 (three) times daily as needed for vomiting or nausea.   oxyCODONE 5 MG immediate release tablet Commonly known as: Oxy IR/ROXICODONE Take 1-2 tablets (5-10 mg total) by mouth every 6 (six) hours as needed for breakthrough pain.   pantoprazole 40 MG tablet Commonly known as: Protonix Take 1 tablet (40 mg total) by mouth daily.   tiotropium 18 MCG inhalation capsule Commonly known as: SPIRIVA Place 1 capsule (18 mcg total) into inhaler and inhale daily. Start taking on: May 26, 2020      No Known Allergies  Follow-up Information    Greer Pickerel, MD. Schedule an appointment as soon as possible for a visit in 3 week(s).   Specialty: General Surgery Why: postop check Contact information: [place] [address] [phone]        Cyndi Bender, PA-C. Schedule an appointment as soon as possible for a visit in 3 week(s).   Specialty: Physician Assistant Why: f/u in 2-3 weeks Contact information: [place] [address] [phone]        Irene Shipper, MD Follow up on 07/01/2020.   Specialty: Gastroenterology Why: f/u as scheduled at 320p Contact information: [address]. [place] [address] [phone]                The results of significant diagnostics from this hospitalization (including imaging, microbiology, ancillary and laboratory) are listed below for reference.    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 EXAM: INTRAOPERATIVE CHOLANGIOGRAM TECHNIQUE: Cholangiographic images from the C-arm fluoroscopic device were submitted for interpretation post-operatively. Please see the procedural report for the amount of contrast and the fluoroscopy time utilized. COMPARISON:  MRCP from 05/22/2020 FINDINGS: Intraoperative antegrade injection via the cystic duct which opacifies the common bile duct and central portions of the intrahepatic biliary tree. Contrast is visualized flowing freely into the duodenum. There is mild irregular tapering of the distal common bile duct with mild dilation of the common bile duct. No significant intra hepatic biliary ductal dilation. No apparent anomalous anatomical configuration of the biliary tree. IMPRESSION: No evidence of choledocholithiasis. 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 Electronically Signed   By: Ruthann Cancer MD   On: 05/24/2020 14:22   MR 3D Recon At Scanner  Result Date: 05/22/2020 CLINICAL DATA:  Inpatient. Cholelithiasis and right upper quadrant abdominal pain. EXAM: MRI ABDOMEN WITHOUT AND WITH CONTRAST (INCLUDING MRCP) TECHNIQUE: Multiplanar multisequence MR imaging  of the abdomen was performed both before and after the administration of intravenous contrast. Heavily T2-weighted images of the biliary and pancreatic ducts were obtained, and three-dimensional MRCP images were rendered by post processing. CONTRAST:  58mL GADAVIST GADOBUTROL 1 MMOL/ML IV SOLN COMPARISON:  05/21/2020 abdominal sonogram.  12/02/2019 MRI abdomen. FINDINGS: Lower chest: No acute abnormality at the lung bases. Hepatobiliary: Normal liver size and configuration. Mild-to-moderate diffuse hepatic steatosis. Tiny simple 0.3 cm  inferior right liver cyst. No suspicious liver masses. Nondistended gallbladder. Minimal layering sludge in the fundal gallbladder. No gallbladder wall thickening. No pericholecystic fluid. No discrete gallstones in the gallbladder. No biliary ductal dilatation. Common bile duct diameter 8 mm, mildly dilated. Somewhat sharp tapering of the common bile duct at the level of the pancreatic head. No biliary filling defects to suggest choledocholithiasis. No beading of the bile ducts. Pancreas: There is generalized enlargement and edema of the pancreatic head and neck with associated mild peripancreatic and periduodenal edema centered in the pancreaticoduodenal groove (series 3/image 18). 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 The main pancreatic duct is mildly dilated (4 mm diameter) and irregular with caliber transition at the level of the pancreatic head. There is a complex 2.3 x 1.6 cm cystic mass in the pancreatic head (series 3/image 21) with mixed signal intensity, mild wall thickening and enhancement and no internal solid enhancement, increased from 1.4 x 1.3 cm on 12/02/2019 MRI. Preserved pancreatic parenchymal enhancement. Spleen: Normal size. No mass. Adrenals/Urinary Tract: Normal adrenals. No hydronephrosis. Normal kidneys with no renal mass. Stomach/Bowel: Normal non-distended stomach. Mild wall thickening with surrounding edema in the descending duodenum, probably reactive. Visualized small and large bowel is normal caliber, with no bowel wall thickening. Vascular/Lymphatic: Atherosclerotic nonaneurysmal abdominal aorta. Patent portal, splenic, hepatic and renal veins. No pathologically enlarged lymph nodes in the abdomen. Other: No abdominal ascites or focal fluid collection. Musculoskeletal: No aggressive appearing focal osseous lesions. IMPRESSION: 1. 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 2. Double duct sign is present with mildly dilated (8 mm diameter) common bile duct and mildly dilated (4 mm diameter) irregular main pancreatic duct, both demonstrating somewhat abrupt caliber transitions at the level of the pancreatic head. An occult pancreatic head neoplasm is very difficult to exclude. As such, ERCP with endoscopic ultrasound is suggested when clinically feasible. Short-term follow-up MRI abdomen without and with IV contrast is also recommended in 3 months. 3. No appreciable gallstones. Minimal gallbladder sludge. No choledocholithiasis. 4. Mild-to-moderate diffuse hepatic steatosis. Electronically Signed   By: Ilona Sorrel M.D.   On: 05/22/2020 10:54   DG Chest Portable 1 View  Result Date: 05/21/2020 CLINICAL DATA:  Shortness of breath EXAM: PORTABLE CHEST 1 VIEW COMPARISON:  02/04/2020 FINDINGS: Heart and mediastinal contours are within normal limits. No focal opacities or effusions. No acute bony abnormality. Aortic atherosclerosis. IMPRESSION: No active disease. Electronically Signed   By: Rolm Baptise M.D.   On: 05/21/2020 22:11   MR ABDOMEN MRCP W WO CONTAST  Result Date: 05/22/2020 CLINICAL DATA:  Inpatient. Cholelithiasis and right upper quadrant abdominal pain. EXAM: MRI ABDOMEN WITHOUT AND WITH CONTRAST (INCLUDING MRCP) TECHNIQUE: Multiplanar multisequence MR imaging of the abdomen was performed both before and after the administration of intravenous contrast. Heavily T2-weighted images of the biliary and pancreatic ducts were obtained, and three-dimensional MRCP images were rendered by post processing. CONTRAST:  48mL GADAVIST GADOBUTROL 1 MMOL/ML IV SOLN COMPARISON:  05/21/2020 abdominal sonogram.  12/02/2019 MRI abdomen. FINDINGS: Lower chest: No acute abnormality at the lung bases. Hepatobiliary: Normal liver size and configuration. Mild-to-moderate diffuse hepatic steatosis. Tiny simple 0.3  cm inferior right liver cyst. No suspicious liver masses. Nondistended gallbladder. Minimal layering sludge in the fundal gallbladder. No gallbladder wall thickening. No pericholecystic fluid. No discrete gallstones in the gallbladder. No biliary ductal dilatation. Common bile duct diameter 8 mm, mildly dilated. Somewhat sharp tapering of the common bile duct at the level of the pancreatic head. No biliary filling defects to suggest choledocholithiasis. No beading of the bile ducts. Pancreas: There is generalized enlargement and edema of the pancreatic head and neck with associated mild peripancreatic and periduodenal edema centered in the pancreaticoduodenal groove (series 3/image 18). 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 The main pancreatic duct is mildly dilated (4 mm diameter) and irregular with caliber transition at the level of the pancreatic head. There is a complex 2.3 x 1.6 cm cystic mass in the pancreatic head (series 3/image 21) with mixed signal intensity, mild wall thickening and enhancement and no internal solid enhancement, increased from 1.4 x 1.3 cm on 12/02/2019 MRI. Preserved pancreatic parenchymal enhancement. Spleen: Normal size. No mass. Adrenals/Urinary Tract: Normal adrenals. No hydronephrosis. Normal kidneys with no renal mass. Stomach/Bowel: Normal non-distended stomach. Mild wall thickening with surrounding edema in the descending duodenum, probably reactive. Visualized small and large bowel is normal caliber, with no bowel wall thickening. Vascular/Lymphatic: Atherosclerotic nonaneurysmal abdominal aorta. Patent portal, splenic, hepatic and renal veins. No pathologically enlarged lymph nodes in the abdomen. Other: No abdominal ascites or focal fluid collection. Musculoskeletal: No aggressive appearing focal osseous lesions. IMPRESSION: 1. 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 2. Double duct sign is present with mildly dilated (8 mm diameter) common bile duct and mildly dilated (4 mm diameter) irregular main pancreatic duct, both demonstrating somewhat abrupt caliber transitions at the level of the pancreatic head. An occult pancreatic head neoplasm is very difficult to exclude. As such, ERCP with endoscopic ultrasound is suggested when clinically feasible. Short-term follow-up MRI abdomen without and with IV contrast is also recommended in 3 months. 3. No appreciable gallstones. Minimal gallbladder sludge. No choledocholithiasis. 4. Mild-to-moderate diffuse hepatic steatosis. Electronically Signed   By: Ilona Sorrel M.D.   On: 05/22/2020 10:54   US Abdomen Limited RUQ (LIVER/GB)  Result Date: 05/21/2020 CLINICAL DATA:  Right upper quadrant pain EXAM: ULTRASOUND ABDOMEN LIMITED RIGHT UPPER QUADRANT COMPARISON:  MRI abdomen 12/02/2019 FINDINGS: Gallbladder: Tiny echogenic foci demonstrated in the dependent gallbladder but without shadowing. Possibly stones or tumefactive sludge. Largest measures about 6 mm. No gallbladder wall thickening or edema. Murphy's sign is positive. Common bile duct: Diameter: 7 mm, upper limits of normal. Liver: Diffusely increased parenchymal echotexture may indicate fatty infiltration. No focal lesions identified. Portal vein is patent on color Doppler imaging with normal direction of blood flow towards the liver. Other: None. IMPRESSION: Tiny echogenic foci in the gallbladder may represent stones or tumefactive sludge. No gallbladder wall thickening or edema. Murphy's sign is positive. Changes are likely to represent acute cholecystitis in the appropriate clinical setting. Electronically Signed   By: Lucienne Capers M.D.   On: 05/21/2020 22:58    Microbiology: Recent Results (from the past 240 hour(s))  Resp Panel by RT-PCR (Flu A&B,  Covid) Nasopharyngeal Swab      Status: None   Collection Time: 05/22/20  1:03 PM   Specimen: Nasopharyngeal Swab; Nasopharyngeal(NP) swabs in vial transport medium  Result Value Ref Range Status   SARS Coronavirus 2 by RT PCR NEGATIVE NEGATIVE Final    Comment: (NOTE) SARS-CoV-2 target nucleic acids are NOT DETECTED.  The SARS-CoV-2 RNA is generally detectable in upper respiratory specimens during the acute phase of infection. The lowest concentration of SARS-CoV-2 viral copies this assay can detect is 138 copies/mL. A negative result does not preclude SARS-Cov-2 infection and should not be used as the sole basis for treatment or other patient management decisions. A negative result may occur with  improper specimen collection/handling, submission of specimen other than nasopharyngeal swab, presence of viral mutation(s) within the areas targeted by this assay, and inadequate number of viral copies(<138 copies/mL). A negative result must be combined with clinical observations, patient history, and epidemiological information. The expected result is Negative.  Fact Sheet for Patients:  EntrepreneurPulse.com.au  Fact Sheet for Healthcare Providers:  IncredibleEmployment.be  This test is no t yet approved or cleared by the [place] FDA and  has been authorized for detection and/or diagnosis of SARS-CoV-2 by FDA under an Emergency Use Authorization (EUA). This EUA will remain  in effect (meaning this test can be used) for the duration of the COVID-19 declaration under Section 564(b)(1) of the Act, 21 U.S.C.section 360bbb-3(b)(1), unless the authorization is terminated  or revoked sooner.       Influenza A by PCR NEGATIVE NEGATIVE Final   Influenza B by PCR NEGATIVE NEGATIVE Final    Comment: (NOTE) The Xpert Xpress SARS-CoV-2/FLU/RSV plus assay is intended as an aid in the diagnosis of influenza from Nasopharyngeal swab specimens and should not be used as a sole basis  for treatment. Nasal washings and aspirates are unacceptable for Xpert Xpress SARS-CoV-2/FLU/RSV testing.  Fact Sheet for Patients: EntrepreneurPulse.com.au  Fact Sheet for Healthcare Providers: IncredibleEmployment.be  This test is not yet approved or cleared by the [place] FDA and has been authorized for detection and/or diagnosis of SARS-CoV-2 by FDA under an Emergency Use Authorization (EUA). This EUA will remain in effect (meaning this test can be used) for the duration of the COVID-19 declaration under Section 564(b)(1) of the Act, 21 U.S.C. section 360bbb-3(b)(1), unless the authorization is terminated or revoked.  Performed at [facility], [place] [address], [place], [place] [address]   Surgical pcr screen     Status: None   Collection Time: 05/24/20  7:31 AM   Specimen: Nasal Mucosa; Nasal Swab  Result Value Ref Range Status   MRSA, PCR NEGATIVE NEGATIVE Final   Staphylococcus aureus NEGATIVE NEGATIVE Final    Comment: (NOTE) The Xpert SA Assay (FDA approved for NASAL specimens in patients 64 years of age and older), is one component of a comprehensive surveillance program. It is not intended to diagnose infection nor to guide or monitor treatment. Performed at [facility], [place] [address] [place], [place] [address]      Labs: Basic Metabolic Panel: Recent Labs  Lab 05/21/20 2124 05/22/20 0403 05/23/20 0301 05/24/20 0256 05/25/20 0348  NA 130* 135 136 137 137  K 3.8 4.1 3.9 3.6 4.0  CL 95* 102 102 102 104  CO2 23 21* 26 27 25   GLUCOSE 104* 84 87 110* 161*  BUN 8 7* 8 <5* 8  CREATININE 0.87 0.75 0.71 0.80 0.89  CALCIUM 8.6* 8.4* 8.4* 8.5* 8.8*  MG  --   --  1.9 1.8 1.7   Liver Function Tests: Recent Labs  Lab 05/21/20 2124 05/22/20 0403 05/23/20 0301 05/24/20 0256 05/25/20 0348  AST 28 26 21 20 27   ALT 20 17 14 14 18   ALKPHOS 65 61 60 58 59  BILITOT 0.7 0.5 0.9 0.7  0.6  PROT 7.2 6.4* 5.7* 5.8* 6.3*  ALBUMIN 3.9 3.4* 3.1* 3.1* 3.4*   Recent Labs  Lab 05/21/20 2124 05/22/20 0403 05/23/20 0301 05/24/20 0256  LIPASE 2,206* 187* 36 40   No results for input(s): AMMONIA in the last 168 hours. CBC: Recent Labs  Lab 05/20/20 1304 05/21/20 2124 05/22/20 0403 05/23/20 0301 05/24/20 0256 05/25/20 0348  WBC 6.0 7.8 6.0 5.0 4.5 6.8  NEUTROABS 3.2  --   --  2.8 2.2 5.9  HGB 14.8 13.7 13.1 12.3* 12.5* 12.9*  HCT 43.5 39.0 38.1* 36.7* 36.9* 38.9*  MCV 102.1* 99.0 101.3* 101.4* 103.1* 103.5*  PLT 365 340 291 229 209 211   Cardiac Enzymes: No results for input(s): CKTOTAL, CKMB, CKMBINDEX, TROPONINI in the last 168 hours. BNP: BNP (last 3 results) No results for input(s): BNP in the last 8760 hours.  ProBNP (last 3 results) No results for input(s): PROBNP in the last 8760 hours.  CBG: No results for input(s): GLUCAP in the last 168 hours.     Signed:  Irine Seal MD.  [facility] Hospitalists 05/25/2020, 10:59 AM

## 2020-05-25 NOTE — Discharge Instructions (Signed)
Justin Suarez, P.A. LAPAROSCOPIC SURGERY: POST OP INSTRUCTIONS Always review your discharge instruction sheet given to you by the facility where your surgery was performed. IF YOU HAVE DISABILITY OR FAMILY LEAVE FORMS, YOU MUST BRING THEM TO THE OFFICE FOR PROCESSING.   DO NOT GIVE THEM TO YOUR DOCTOR.  PAIN CONTROL  1. First take acetaminophen (Tylenol) AND/or ibuprofen (Advil) to control your pain after surgery.  Follow directions on package.  Taking acetaminophen (Tylenol) and/or ibuprofen (Advil) regularly after surgery will help to control your pain and lower the amount of prescription pain medication you may need.  You should not take more than 3,000 mg (3 grams) of acetaminophen (Tylenol) in 24 hours.  You should not take ibuprofen (Advil), aleve, motrin, naprosyn or other NSAIDS if you have a history of stomach ulcers or chronic kidney disease.  2. A prescription for pain medication may be given to you upon discharge.  Take your pain medication as prescribed, if you still have uncontrolled pain after taking acetaminophen (Tylenol) or ibuprofen (Advil). 3. Use ice packs to help control pain. 4. If you need a refill on your pain medication, please contact your pharmacy.  They will contact our office to request authorization. Prescriptions will not be filled after 5pm or on week-ends.  HOME MEDICATIONS 5. Take your usually prescribed medications unless otherwise directed.  DIET 6. You should follow a light diet the first few days after arrival home.  Be sure to include lots of fluids daily. Avoid fatty, fried foods.   CONSTIPATION 7. It is common to experience some constipation after surgery and if you are taking pain medication.  Increasing fluid intake and taking a stool softener (such as Colace) will usually help or prevent this problem from occurring.  A mild laxative (Milk of Magnesia or Miralax) should be taken according to package instructions if there are no bowel  movements after 48 hours.  WOUND/INCISION CARE 8. Most patients will experience some swelling and bruising in the area of the incisions.  Ice packs will help.  Swelling and bruising can take several days to resolve.  9. Unless discharge instructions indicate otherwise, follow guidelines below  a. STERI-STRIPS - you may remove your outer bandages 48 hours after surgery, and you may shower at that time.  You have steri-strips (small skin tapes) in place directly over the incision.  These strips should be left on the skin for 7-10 days.   b. DERMABOND/SKIN GLUE - you may shower in 24 hours.  The glue will flake off over the next 2-3 weeks. 10. Any sutures or staples will be removed at the office during your follow-up visit.  ACTIVITIES 11. You may resume regular (light) daily activities beginning the next day--such as daily self-care, walking, climbing stairs--gradually increasing activities as tolerated.  You may have sexual intercourse when it is comfortable.  Refrain from any heavy lifting or straining until approved by your doctor. a. You may drive when you are no longer taking prescription pain medication, you can comfortably wear a seatbelt, and you can safely maneuver your car and apply brakes.  FOLLOW-UP 12. You should see your doctor in the office for a follow-up appointment approximately 2-3 weeks after your surgery.  You should have been given your post-op/follow-up appointment when your surgery was scheduled.  If you did not receive a post-op/follow-up appointment, make sure that you call for this appointment within a day or two after you arrive home to insure a convenient appointment time.  OTHER  INSTRUCTIONS 13.   WHEN TO CALL YOUR DOCTOR: 1. Fever over 101.0 2. Inability to urinate 3. Continued bleeding from incision. 4. Increased pain, redness, or drainage from the incision. 5. Increasing abdominal pain  The clinic staff is available to answer your questions during regular  business hours.  Please don't hesitate to call and ask to speak to one of the nurses for clinical concerns.  If you have a medical emergency, go to the nearest emergency room or call 911.  A surgeon from [facility] is always on call at the hospital. [address], [place], [place], [place]  [address] ? [address], [place], [place]   [address] [phone] ? [phone] ? FAX [phone] Web site: [email or web address]  ........Marland Kitchen   Managing Your Pain After Surgery Without Opioids    Thank you for participating in our program to help patients manage their pain after surgery without opioids. This is part of our effort to provide you with the best care possible, without exposing you or your family to the risk that opioids pose.  What pain can I expect after surgery? You can expect to have some pain after surgery. This is normal. The pain is typically worse the day after surgery, and quickly begins to get better. Many studies have found that many patients are able to manage their pain after surgery with Over-the-Counter (OTC) medications such as Tylenol and Motrin. If you have a condition that does not allow you to take Tylenol or Motrin, notify your surgical team.  How will I manage my pain? The best strategy for controlling your pain after surgery is around the clock pain control with Tylenol (acetaminophen) and Motrin (ibuprofen or Advil). Alternating these medications with each other allows you to maximize your pain control. In addition to Tylenol and Motrin, you can use heating pads or ice packs on your incisions to help reduce your pain.  How will I alternate your regular strength over-the-counter pain medication? You will take a dose of pain medication every three hours. ; Start by taking 650 mg of Tylenol (2 pills of 325 mg) ; 3 hours later take 600 mg of Motrin (3 pills of 200 mg) ; 3 hours after taking the Motrin take 650 mg of Tylenol ; 3 hours  after that take 600 mg of Motrin.   - 1 -  See example - if your first dose of Tylenol is at 12:00 PM   12:00 PM Tylenol 650 mg (2 pills of 325 mg)  3:00 PM Motrin 600 mg (3 pills of 200 mg)  6:00 PM Tylenol 650 mg (2 pills of 325 mg)  9:00 PM Motrin 600 mg (3 pills of 200 mg)  Continue alternating every 3 hours   We recommend that you follow this schedule around-the-clock for at least 3 days after surgery, or until you feel that it is no longer needed. Use the table on the last page of this handout to keep track of the medications you are taking. Important: Do not take more than 3000mg  of Tylenol or 1800mg  of Motrin in a 24-hour period. Do not take ibuprofen/Motrin if you have a history of bleeding stomach ulcers, severe kidney disease, &/or actively taking a blood thinner  What if I still have pain? If you have pain that is not controlled with the over-the-counter pain medications (Tylenol and Motrin or Advil) you might have what we call "breakthrough" pain. You will receive a prescription for a small amount of an opioid  pain medication such as Oxycodone, Tramadol, or Tylenol with Codeine. Use these opioid pills in the first 24 hours after surgery if you have breakthrough pain. Do not take more than 1 pill every 4-6 hours.  If you still have uncontrolled pain after using all opioid pills, don't hesitate to call our staff using the number provided. We will help make sure you are managing your pain in the best way possible, and if necessary, we can provide a prescription for additional pain medication.   Day 1    Time  Name of Medication Number of pills taken  Amount of Acetaminophen  Pain Level   Comments  AM PM       AM PM       AM PM       AM PM       AM PM       AM PM       AM PM       AM PM       Total Daily amount of Acetaminophen Do not take more than  3,000 mg per day      Day 2    Time  Name of Medication Number of pills taken  Amount of Acetaminophen  Pain  Level   Comments  AM PM       AM PM       AM PM       AM PM       AM PM       AM PM       AM PM       AM PM       Total Daily amount of Acetaminophen Do not take more than  3,000 mg per day      Day 3    Time  Name of Medication Number of pills taken  Amount of Acetaminophen  Pain Level   Comments  AM PM       AM PM       AM PM       AM PM          AM PM       AM PM       AM PM       AM PM       Total Daily amount of Acetaminophen Do not take more than  3,000 mg per day      Day 4    Time  Name of Medication Number of pills taken  Amount of Acetaminophen  Pain Level   Comments  AM PM       AM PM       AM PM       AM PM       AM PM       AM PM       AM PM       AM PM       Total Daily amount of Acetaminophen Do not take more than  3,000 mg per day      Day 5    Time  Name of Medication Number of pills taken  Amount of Acetaminophen  Pain Level   Comments  AM PM       AM PM       AM PM       AM PM       AM PM       AM PM  AM PM       AM PM       Total Daily amount of Acetaminophen Do not take more than  3,000 mg per day       Day 6    Time  Name of Medication Number of pills taken  Amount of Acetaminophen  Pain Level  Comments  AM PM       AM PM       AM PM       AM PM       AM PM       AM PM       AM PM       AM PM       Total Daily amount of Acetaminophen Do not take more than  3,000 mg per day      Day 7    Time  Name of Medication Number of pills taken  Amount of Acetaminophen  Pain Level   Comments  AM PM       AM PM       AM PM       AM PM       AM PM       AM PM       AM PM       AM PM       Total Daily amount of Acetaminophen Do not take more than  3,000 mg per day        For additional information about how and where to safely dispose of unused opioid medications - [employer]  Disclaimer: This document contains information and/or instructional materials adapted  from [place] for the typical patient with your condition. It does not replace medical advice from your health care provider because your experience may differ from that of the typical patient. Talk to your health care provider if you have any questions about this document, your condition or your treatment plan. Adapted from [place] If you have a gallbladder condition, you may have trouble digesting fats. Eating a low-fat diet can help reduce your symptoms, and may be helpful before and after having surgery to remove your gallbladder (cholecystectomy). Your health care provider may recommend that you work with a diet and nutrition specialist (dietitian) to help you reduce the amount of fat in your diet. What are tips for following this plan? General guidelines  Limit your fat intake to less than 30% of your total daily calories. If you eat around 1,800 calories each day, this is less than 60 grams (g) of fat per day.  Fat is an important part of a healthy diet. Eating a low-fat diet can make it hard to maintain a healthy body weight. Ask your dietitian how much fat, calories, and other nutrients you need each day.  Eat small, frequent meals throughout the day instead of three large meals.  Drink at least 8-10 cups of fluid a day. Drink enough fluid to keep your urine clear or pale yellow.  Limit alcohol intake to no more than 1 drink a day for nonpregnant women and 2 drinks a day for men. One drink equals 12 oz of beer, 5 oz of wine, or 1 oz of hard liquor. Reading food labels  Check Nutrition Facts on food labels for the amount of fat per serving. Choose foods with less than 3 grams of fat per serving.   Shopping  Choose nonfat and low-fat healthy foods. Look for the  words "nonfat," "low fat," or "fat free."  Avoid buying processed or prepackaged foods. Cooking  Cook using low-fat methods, such as baking, broiling, grilling, or  boiling.  Cook with small amounts of healthy fats, such as olive oil, grapeseed oil, canola oil, or sunflower oil. What foods are recommended?  All fresh, frozen, or canned fruits and vegetables.  Whole grains.  Low-fat or non-fat (skim) milk and yogurt.  Lean meat, skinless poultry, fish, eggs, and beans.  Low-fat protein supplement powders or drinks.  Spices and herbs. What foods are not recommended?  High-fat foods. These include baked goods, fast food, fatty cuts of meat, ice cream, french toast, sweet rolls, pizza, cheese bread, foods covered with butter, creamy sauces, or cheese.  Fried foods. These include french fries, tempura, battered fish, breaded chicken, fried breads, and sweets.  Foods with strong odors.  Foods that cause bloating and gas. Summary  A low-fat diet can be helpful if you have a gallbladder condition, or before and after gallbladder surgery.  Limit your fat intake to less than 30% of your total daily calories. This is about 60 g of fat if you eat 1,800 calories each day.  Eat small, frequent meals throughout the day instead of three large meals. This information is not intended to replace advice given to you by your health care provider. Make sure you discuss any questions you have with your health care provider. Document Revised: 09/25/2019 Document Reviewed: 09/25/2019 Elsevier Patient Education  2021 [employer].

## 2020-05-25 NOTE — Progress Notes (Signed)
Justin Suarez Progress Note  CC: Pancreatitis, enlarging pancreatic head mass/pseudocyst    Subjective: He stated " I feel great". S/P laparoscopic cholecystectomy by Dr. Greer Pickerel yesterday. He is walking laps in the hallway. No N/V. Mild tenderness to the incision sites otherwise no significant abdominal pain. He passed 2 small solid greenish colored stools this morning.    Objective:   IOC 05/24/2020: No evidence of choledocholithiasis. 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  Abdominal MRI/MRCP W/WO contrast 05/22/2020:  1. 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 2. Double duct sign is present with mildly dilated (8 mm diameter) common bile duct and mildly dilated (4 mm diameter) irregular main pancreatic duct, both demonstrating somewhat abrupt caliber transitions at the level of the pancreatic head. An occult pancreatic head neoplasm is very difficult to exclude. As such, ERCP with endoscopic ultrasound is suggested when clinically feasible. Short-term follow-up MRI abdomen without and with IV contrast is also recommended in 3 months. 3. No appreciable gallstones. Minimal gallbladder sludge. No choledocholithiasis. 4. Mild-to-moderate diffuse hepatic steatosis  EUS 04/01/2020: - Findings consistent with chronic pancreatitis, probably etoh related given his former heavy alcohol abuse (a case of beer daily for many years, much less lately). The changes of chronic pancreatitis derease the ability of imaging tests (including this EUS) to detect neoplastic growths. - Cystic lesion in uncinate pancreas without concerning morophologic  features on this exam or recent MRI. This is probably a pseudocyst, I recommend repeat MRI in 2 years to ensure stability. - No gallstones or CBD stones but his gallbladder is thickwalled. His symptoms are fairly classic for biliary colic.  Vital signs in last 24 hours: Temp:  [97.6 F (36.4 C)-98.5 F (36.9 C)] 97.6 F (36.4 C) (04/05 0345) Pulse Rate:  [57-79] 69 (04/05 0345) Resp:  [14-19] 19 (04/05 0345) BP: (123-147)/(68-82) 147/80 (04/05 0345) SpO2:  [97 %-100 %] 98 % (04/05 0834) Weight:  [80.3 kg] 80.3 kg (04/04 1132) Last BM Date: 05/24/20 General:   Alert in NAD. Heart: RRR, no murmur.  Pulm:  Breath sounds clear throughout.  Abdomen: Soft, mild tenderness to incision sites,  laparoscopic incisions/drsgs intact. Nondistended. + BS x 4 quads.  Extremities:  Without edema. Neurologic:  Alert and  oriented x 4;  grossly normal neurologically. Psych:  Alert and cooperative. Normal mood and affect.  Intake/Output from previous day: 04/04 0701 - 04/05 0700 In: 1600 [I.V.:1500; IV Piggyback:100] Out: 25 [Blood:25] Intake/Output this shift: No intake/output data recorded.  Lab Results: Recent Labs    05/23/20 0301 05/24/20 0256 05/25/20 0348  WBC 5.0 4.5 6.8  HGB 12.3* 12.5* 12.9*  HCT 36.7* 36.9* 38.9*  PLT 229 209 211   BMET Recent Labs    05/23/20 0301 05/24/20 0256 05/25/20 0348  NA 136 137 137  K 3.9 3.6 4.0  CL 102 102 104  CO2 26 27 25   GLUCOSE 87 110* 161*  BUN 8 <5* 8  CREATININE 0.71 0.80 0.89  CALCIUM 8.4* 8.5* 8.8*   LFT Recent Labs    05/25/20 0348  PROT 6.3*  ALBUMIN 3.4*  AST 27  ALT 18  ALKPHOS 59  BILITOT 0.6   PT/INR No results for input(s): LABPROT, INR in the  last 72 hours. Hepatitis Panel No results for input(s): HEPBSAG, HCVAB, HEPAIGM, HEPBIGM in the last 72 hours.  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 EXAM: INTRAOPERATIVE  CHOLANGIOGRAM TECHNIQUE: Cholangiographic images from the C-arm fluoroscopic device were submitted for interpretation post-operatively. Please see the procedural report for the amount of contrast and the fluoroscopy time utilized. COMPARISON:  MRCP from 05/22/2020 FINDINGS: Intraoperative antegrade injection via the cystic duct which opacifies the common bile duct and central portions of the intrahepatic biliary tree. Contrast is visualized flowing freely into the duodenum. There is mild irregular tapering of the distal common bile duct with mild dilation of the common bile duct. No significant intra hepatic biliary ductal dilation. No apparent anomalous anatomical configuration of the biliary tree. IMPRESSION: No evidence of choledocholithiasis. 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 Electronically Signed   By: Ruthann Cancer MD   On: 05/24/2020 14:22    Assessment / Plan:  42. 65 year old male with a history of chronic pancreatitis with a cystic lesion to the ucinate process per EUS 03/2020  (likely due to alcohol use) admitted to the hospital 05/21/2020 with acute gallstone pancreatitis. 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 CBD and pancreatic duct dilated.  S/P laparoscopic cholecystectomy with negative IOC 05/24/2020. Residual pancreatitis with swelling around duodenum and irregular CBD assessed intraoperatively by Dr. Redmond Pulling. LFTs normal. IgG4 level pending. -Patient cleared for discharge by general surgery  -Patient has a follow up appointment scheduled with  Dr. Henrene Pastor on 07/01/2020 at 3:20pm, Dr. Henrene Pastor to decide about timing of repeat pancreas  imaging at that time  2. Hepatic steatosis     Principal Problem:   Acute pancreatitis Active Problems:   COPD (chronic obstructive pulmonary disease) (HCC)   Hyponatremia   Essential hypertension   Gallstone pancreatitis   RUQ pain   Dehydration     LOS: 3 days   Noralyn Pick  05/25/2020, 9:45 AM

## 2020-05-25 NOTE — Plan of Care (Signed)

## 2020-05-25 NOTE — Care Management Important Message (Signed)
Important Message  Patient Details IM Letter given to the Patient. Name: Justin Suarez MRN: [id number] Date of Birth: 29-Dec-1955   Medicare Important Message Given:  Yes     Kerin Salen 05/25/2020, 9:53 AM

## 2020-05-25 NOTE — Plan of Care (Signed)
  Problem: Education: Goal: Knowledge of General Education information will improve Description: Including pain rating scale, medication(s)/side effects and non-pharmacologic comfort measures 05/25/2020 1106 by Rance Muir, RN Outcome: Adequate for Discharge 05/25/2020 1056 by Rance Muir, RN Outcome: Adequate for Discharge   Problem: Health Behavior/Discharge Planning: Goal: Ability to manage health-related needs will improve 05/25/2020 1106 by Rance Muir, RN Outcome: Adequate for Discharge 05/25/2020 1056 by Rance Muir, RN Outcome: Adequate for Discharge   Problem: Clinical Measurements: Goal: Ability to maintain clinical measurements within normal limits will improve 05/25/2020 1106 by Rance Muir, RN Outcome: Adequate for Discharge 05/25/2020 1056 by Rance Muir, RN Outcome: Adequate for Discharge Goal: Will remain free from infection 05/25/2020 1106 by Rance Muir, RN Outcome: Adequate for Discharge 05/25/2020 1056 by Rance Muir, RN Outcome: Adequate for Discharge Goal: Diagnostic test results will improve 05/25/2020 1106 by Rance Muir, RN Outcome: Adequate for Discharge 05/25/2020 1056 by Rance Muir, RN Outcome: Adequate for Discharge Goal: Respiratory complications will improve 05/25/2020 1106 by Rance Muir, RN Outcome: Adequate for Discharge 05/25/2020 1056 by Rance Muir, RN Outcome: Adequate for Discharge Goal: Cardiovascular complication will be avoided 05/25/2020 1106 by Rance Muir, RN Outcome: Adequate for Discharge 05/25/2020 1056 by Rance Muir, RN Outcome: Adequate for Discharge   Problem: Activity: Goal: Risk for activity intolerance will decrease 05/25/2020 1106 by Rance Muir, RN Outcome: Adequate for Discharge 05/25/2020 1056 by Rance Muir, RN Outcome: Adequate for Discharge   Problem: Nutrition: Goal: Adequate nutrition will be maintained 05/25/2020 1106 by Rance Muir, RN Outcome: Adequate for Discharge 05/25/2020 1056 by Rance Muir, RN Outcome: Adequate for Discharge   Problem: Coping: Goal: Level  of anxiety will decrease 05/25/2020 1106 by Rance Muir, RN Outcome: Adequate for Discharge 05/25/2020 1056 by Rance Muir, RN Outcome: Adequate for Discharge   Problem: Elimination: Goal: Will not experience complications related to bowel motility 05/25/2020 1106 by Rance Muir, RN Outcome: Adequate for Discharge 05/25/2020 1056 by Rance Muir, RN Outcome: Adequate for Discharge Goal: Will not experience complications related to urinary retention 05/25/2020 1106 by Rance Muir, RN Outcome: Adequate for Discharge 05/25/2020 1056 by Rance Muir, RN Outcome: Adequate for Discharge   Problem: Pain Managment: Goal: General experience of comfort will improve 05/25/2020 1106 by Rance Muir, RN Outcome: Adequate for Discharge 05/25/2020 1056 by Rance Muir, RN Outcome: Adequate for Discharge   Problem: Safety: Goal: Ability to remain free from injury will improve 05/25/2020 1106 by Rance Muir, RN Outcome: Adequate for Discharge 05/25/2020 1056 by Rance Muir, RN Outcome: Adequate for Discharge   Problem: Skin Integrity: Goal: Risk for impaired skin integrity will decrease 05/25/2020 1106 by Rance Muir, RN Outcome: Adequate for Discharge 05/25/2020 1056 by Rance Muir, RN Outcome: Adequate for Discharge

## 2020-05-26 LAB — IGG 4: IgG, Subclass 4: 32 mg/dL (ref 2–96)

## 2020-05-26 LAB — SURGICAL PATHOLOGY

## 2020-06-20 DIAGNOSIS — K859 Acute pancreatitis without necrosis or infection, unspecified: Secondary | ICD-10-CM

## 2020-06-20 HISTORY — DX: Acute pancreatitis without necrosis or infection, unspecified: K85.90

## 2020-07-01 ENCOUNTER — Telehealth: Payer: Self-pay

## 2020-07-01 ENCOUNTER — Ambulatory Visit: Payer: Medicare Other | Admitting: Internal Medicine

## 2020-07-01 NOTE — Telephone Encounter (Signed)
Spoke to patient to move his office appointment as Dr. Henrene Pastor has an appointment.  Rescheduled him to 08/18/2020.  Patient says he is doing well after his surgery

## 2020-08-18 ENCOUNTER — Ambulatory Visit: Payer: Medicare Other | Admitting: Internal Medicine

## 2020-08-18 ENCOUNTER — Encounter: Payer: Self-pay | Admitting: Internal Medicine

## 2020-08-18 VITALS — BP 160/80 | HR 75 | Ht 73.0 in | Wt 176.8 lb

## 2020-08-18 DIAGNOSIS — R194 Change in bowel habit: Secondary | ICD-10-CM | POA: Diagnosis not present

## 2020-08-18 DIAGNOSIS — B182 Chronic viral hepatitis C: Secondary | ICD-10-CM | POA: Diagnosis not present

## 2020-08-18 DIAGNOSIS — R935 Abnormal findings on diagnostic imaging of other abdominal regions, including retroperitoneum: Secondary | ICD-10-CM

## 2020-08-18 DIAGNOSIS — K863 Pseudocyst of pancreas: Secondary | ICD-10-CM

## 2020-08-18 DIAGNOSIS — K8500 Idiopathic acute pancreatitis without necrosis or infection: Secondary | ICD-10-CM

## 2020-08-18 NOTE — Patient Instructions (Signed)
If you are age 66 or older, your body mass index should be between 23-30. Your Body mass index is 23.33 kg/m. If this is out of the aforementioned range listed, please consider follow up with your Primary Care Provider.  If you are age 75 or younger, your body mass index should be between 19-25. Your Body mass index is 23.33 kg/m. If this is out of the aformentioned range listed, please consider follow up with your Primary Care Provider.   __________________________________________________________  The [facility] providers would like to encourage you to use [facility] to communicate with providers for non-urgent requests or questions.  Due to long hold times on the telephone, sending your provider a message by [facility] may be a faster and more efficient way to get a response.  Please allow 48 business hours for a response.  Please remember that this is for non-urgent requests.   I will call you in couple of months to set you up for an MRI at the end of September.

## 2020-08-25 ENCOUNTER — Encounter: Payer: Self-pay | Admitting: Internal Medicine

## 2020-08-25 NOTE — Progress Notes (Signed)
HISTORY OF PRESENT ILLNESS:  Justin Suarez is a 65 y.o. male with multiple medical problems as listed below who was evaluated in this office as a new GI patient February 04, 2020.  See that dictation for details.  Issues at that time more right upper quadrant pain, mild biliary ductal dilation, normal liver tests, indeterminant pancreatic lesion, and abnormal breath sounds on physical exam and a smoker.  Chest x-ray was unremarkable.  He was referred to Dr. Ardis Hughs for endoscopic ultrasound of the pancreas which was performed April 01, 2020.  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  The cystic lesion of the uncinate process did not have concerning morphologic features.  Repeat MRI in 2 years recommended.  There is no evidence of gallstones or CBD stones but the gallbladder wall was thickened.  He was referred for cholecystectomy but did not have this performed.  He was admitted to the hospital May 21, 2020 with acute pancreatitis.  He subsequently underwent laparoscopic cholecystectomy May 24, 2020.  The IOC was negative for choledocholithiasis.  Probable extrinsic compression of the distal bile duct due to pancreatitis.  Additional imaging at that time revealed enlarging (2.3 cm) pancreatic head pseudocyst.  No additional interventions other than GI follow-up recommended.  Liver tests at discharge were normal.  Unremarkable CBC.  The patient's PCP screen him for hepatitis C.  This returned positive.  Genotype 2B.  Quantitation revealed 776,000 international units/mL.  Patient tells me that he is here today for follow-up regarding his pancreatitis and abnormal pancreatic imaging.  As well the diagnosis of hepatitis C.  He also reports to me intermittent problems with abdominal bloating and loose stools.  No stearrhea.  Increased flatus.  He continues to smoke.  He continues to drink beer.  Otherwise asymptomatic today.  No abdominal pain.  Stable weight.  REVIEW OF SYSTEMS:  All  non-GI ROS negative unless otherwise stated in the HPI.  Past Medical History:  Diagnosis Date   Abnormal CT scan, chest 06/2011   multiple lung nodules; needs repeat study in 3 to 12 months.   Arthritis    Hands   Chronic anticoagulation    Common bile duct dilation    COPD (chronic obstructive pulmonary disease) (HCC)    Pt denies   DVT (deep venous thrombosis) (HCC)    GERD (gastroesophageal reflux disease)    Hepatitis C    History of hiatal hernia    HTN (hypertension)    Noncompliance    Pulmonary embolus ([place])    Tick bite 07/2011   Tobacco abuse     Past Surgical History:  Procedure Laterality Date   ANKLE SURGERY Right    x 1 from a fall   CHOLECYSTECTOMY N/A 05/24/2020   Procedure: LAPAROSCOPIC CHOLECYSTECTOMY WITH INTRAOPERATIVE CHOLANGIOGRAM;  Surgeon: Greer Pickerel, MD;  Location: [facility];  Service: General;  Laterality: N/A;   ESOPHAGOGASTRODUODENOSCOPY (EGD) WITH PROPOFOL N/A 04/01/2020   Procedure: ESOPHAGOGASTRODUODENOSCOPY (EGD) WITH PROPOFOL;  Surgeon: Milus Banister, MD;  Location: [facility];  Service: Endoscopy;  Laterality: N/A;   EUS N/A 04/01/2020   Procedure: UPPER ENDOSCOPIC ULTRASOUND (EUS) RADIAL;  Surgeon: Milus Banister, MD;  Location: [facility];  Service: Endoscopy;  Laterality: N/A;   FOOT SURGERY Right    x 3 from a fall   INGUINAL HERNIA REPAIR Right     Social History Justin Suarez  reports that he has been smoking cigarettes. He has a 43.00 pack-year smoking history. He has never used  smokeless tobacco. He reports current alcohol use. He reports that he does not use drugs.  family history includes Atrial fibrillation in his brother; Coronary artery disease in his sister and sister; Heart disease in his father, mother, sister, and sister; Heart failure in his mother.  No Known Allergies     PHYSICAL EXAMINATION: Vital signs: BP (!) 160/80   Pulse 75   Ht 6\' 1"  (1.854 m)   Wt 176 lb 12.8 oz (80.2 kg)   SpO2 98%   BMI 23.33  kg/m   Constitutional: generally well-appearing, no acute distress Psychiatric: alert and oriented x3, cooperative Eyes: extraocular movements intact, anicteric, conjunctiva pink Mouth: oral pharynx moist, no lesions Neck: supple no lymphadenopathy Cardiovascular: heart regular rate and rhythm, no murmur Lungs: clear to auscultation bilaterally Abdomen: soft, nontender, nondistended, no obvious ascites, no peritoneal signs, normal bowel sounds, no organomegaly Rectal: Omitted Extremities: no clubbing, cyanosis, or lower extremity edema bilaterally Skin: no lesions on visible extremities Neuro: No focal deficits. No asterixis.     ASSESSMENT:  1.  Alcoholic pancreatitis.  Clinically resolved. 2.  Pancreatic pseudocyst of the uncinate process.  Evaluated by EUS earlier this year.  Being followed. 3.  Recurrent right upper quadrant pain consistent with biliary colic.  Status post laparoscopic cholecystectomy with negative IOC April 2022.  Final pathology shows mild chronic cholecystitis 4.  Hepatitis C identified by his PCP.  Genotype 2B.  Quantitation 776,000 international units/mL.  No evidence for advanced liver disease.  Normal liver tests. 5.  Colon cancer screening.  Has not had 6.  Nonspecific change in bowel habits   PLAN:  1.  Stop drinking alcohol 2.  Stop smoking 3.  Routine office follow-up 3 months.  May consider repeat MRI at that time 4.  Would refer to ID for consideration of treatment of hepatitis C.  This will be arranged at the time of his follow-up visit 5.  Need to discuss colon cancer screening strategies 6.  Probiotic. A total time of 45 minutes was spent preparing to see the patient, reviewing multiple tests and x-rays, reviewing operative and pathology reports, obtaining comprehensive history, performing comprehensive physical examination, counseling the patient regarding his above listed issues, arranging follow-up and follow-up plans, and documenting  clinical information in the health record.

## 2020-09-01 DIAGNOSIS — Z6823 Body mass index (BMI) 23.0-23.9, adult: Secondary | ICD-10-CM | POA: Diagnosis not present

## 2020-09-01 DIAGNOSIS — I10 Essential (primary) hypertension: Secondary | ICD-10-CM | POA: Diagnosis not present

## 2020-11-18 DIAGNOSIS — E785 Hyperlipidemia, unspecified: Secondary | ICD-10-CM | POA: Diagnosis not present

## 2020-11-18 DIAGNOSIS — K863 Pseudocyst of pancreas: Secondary | ICD-10-CM | POA: Diagnosis not present

## 2020-11-18 DIAGNOSIS — R079 Chest pain, unspecified: Secondary | ICD-10-CM | POA: Diagnosis not present

## 2020-11-18 DIAGNOSIS — Z743 Need for continuous supervision: Secondary | ICD-10-CM | POA: Diagnosis not present

## 2020-11-18 DIAGNOSIS — K6389 Other specified diseases of intestine: Secondary | ICD-10-CM | POA: Diagnosis not present

## 2020-11-18 DIAGNOSIS — K3189 Other diseases of stomach and duodenum: Secondary | ICD-10-CM | POA: Diagnosis not present

## 2020-11-18 DIAGNOSIS — K219 Gastro-esophageal reflux disease without esophagitis: Secondary | ICD-10-CM | POA: Diagnosis not present

## 2020-11-18 DIAGNOSIS — K760 Fatty (change of) liver, not elsewhere classified: Secondary | ICD-10-CM | POA: Diagnosis not present

## 2020-11-18 DIAGNOSIS — K859 Acute pancreatitis without necrosis or infection, unspecified: Secondary | ICD-10-CM | POA: Diagnosis not present

## 2020-11-18 DIAGNOSIS — K449 Diaphragmatic hernia without obstruction or gangrene: Secondary | ICD-10-CM | POA: Diagnosis not present

## 2020-11-18 DIAGNOSIS — R1013 Epigastric pain: Secondary | ICD-10-CM | POA: Diagnosis not present

## 2020-11-18 DIAGNOSIS — I2782 Chronic pulmonary embolism: Secondary | ICD-10-CM | POA: Diagnosis not present

## 2020-11-18 DIAGNOSIS — Z79899 Other long term (current) drug therapy: Secondary | ICD-10-CM | POA: Diagnosis not present

## 2020-11-18 DIAGNOSIS — R6889 Other general symptoms and signs: Secondary | ICD-10-CM | POA: Diagnosis not present

## 2020-11-18 DIAGNOSIS — I10 Essential (primary) hypertension: Secondary | ICD-10-CM | POA: Diagnosis not present

## 2020-11-18 DIAGNOSIS — Z9049 Acquired absence of other specified parts of digestive tract: Secondary | ICD-10-CM | POA: Diagnosis not present

## 2020-11-18 DIAGNOSIS — R7401 Elevation of levels of liver transaminase levels: Secondary | ICD-10-CM | POA: Diagnosis not present

## 2020-11-18 DIAGNOSIS — R1011 Right upper quadrant pain: Secondary | ICD-10-CM | POA: Diagnosis not present

## 2020-11-18 DIAGNOSIS — J449 Chronic obstructive pulmonary disease, unspecified: Secondary | ICD-10-CM | POA: Diagnosis not present

## 2020-11-18 DIAGNOSIS — R0789 Other chest pain: Secondary | ICD-10-CM | POA: Diagnosis not present

## 2020-11-18 DIAGNOSIS — F17210 Nicotine dependence, cigarettes, uncomplicated: Secondary | ICD-10-CM | POA: Diagnosis not present

## 2020-11-18 DIAGNOSIS — D539 Nutritional anemia, unspecified: Secondary | ICD-10-CM | POA: Diagnosis not present

## 2020-11-18 DIAGNOSIS — K2980 Duodenitis without bleeding: Secondary | ICD-10-CM | POA: Diagnosis not present

## 2020-11-18 DIAGNOSIS — R0602 Shortness of breath: Secondary | ICD-10-CM | POA: Diagnosis not present

## 2020-11-18 DIAGNOSIS — R109 Unspecified abdominal pain: Secondary | ICD-10-CM | POA: Diagnosis not present

## 2020-11-19 DIAGNOSIS — K2980 Duodenitis without bleeding: Secondary | ICD-10-CM | POA: Diagnosis not present

## 2020-11-19 DIAGNOSIS — K6389 Other specified diseases of intestine: Secondary | ICD-10-CM | POA: Diagnosis not present

## 2020-11-19 DIAGNOSIS — K3189 Other diseases of stomach and duodenum: Secondary | ICD-10-CM | POA: Diagnosis not present

## 2020-11-19 DIAGNOSIS — K859 Acute pancreatitis without necrosis or infection, unspecified: Secondary | ICD-10-CM | POA: Diagnosis not present

## 2020-11-22 DIAGNOSIS — R109 Unspecified abdominal pain: Secondary | ICD-10-CM | POA: Diagnosis not present

## 2020-11-24 DIAGNOSIS — I2782 Chronic pulmonary embolism: Secondary | ICD-10-CM | POA: Diagnosis not present

## 2020-11-24 DIAGNOSIS — K863 Pseudocyst of pancreas: Secondary | ICD-10-CM | POA: Diagnosis not present

## 2020-11-29 DIAGNOSIS — I2782 Chronic pulmonary embolism: Secondary | ICD-10-CM | POA: Diagnosis not present

## 2020-11-29 DIAGNOSIS — K862 Cyst of pancreas: Secondary | ICD-10-CM | POA: Diagnosis not present

## 2020-11-29 DIAGNOSIS — K859 Acute pancreatitis without necrosis or infection, unspecified: Secondary | ICD-10-CM | POA: Diagnosis not present

## 2020-11-29 DIAGNOSIS — R7989 Other specified abnormal findings of blood chemistry: Secondary | ICD-10-CM | POA: Diagnosis not present

## 2020-12-03 ENCOUNTER — Telehealth: Payer: Self-pay

## 2020-12-03 NOTE — Telephone Encounter (Signed)
-----   Message from Audrea Muscat, [place] sent at 08/18/2020 10:34 AM EDT ----- Call patient to schedule MRI attention to pancreas for the end of September.

## 2020-12-03 NOTE — Telephone Encounter (Signed)
Spoke with patient to schedule his MRI - he reports that he had an incident of pancreatitis recently and therefore had an MRI at [facility] last week.  He asked his doctor to forward the results to Dr. Henrene Pastor.  I will watch for those and call his Dr. If I don't receive them

## 2021-03-08 DIAGNOSIS — Z9181 History of falling: Secondary | ICD-10-CM | POA: Diagnosis not present

## 2021-03-08 DIAGNOSIS — I2782 Chronic pulmonary embolism: Secondary | ICD-10-CM | POA: Diagnosis not present

## 2021-03-08 DIAGNOSIS — D692 Other nonthrombocytopenic purpura: Secondary | ICD-10-CM | POA: Diagnosis not present

## 2021-03-08 DIAGNOSIS — J449 Chronic obstructive pulmonary disease, unspecified: Secondary | ICD-10-CM | POA: Diagnosis not present

## 2021-03-08 DIAGNOSIS — I700 Atherosclerosis of aorta: Secondary | ICD-10-CM | POA: Diagnosis not present

## 2021-05-26 DIAGNOSIS — E785 Hyperlipidemia, unspecified: Secondary | ICD-10-CM | POA: Diagnosis not present

## 2021-05-26 DIAGNOSIS — Z0000 Encounter for general adult medical examination without abnormal findings: Secondary | ICD-10-CM | POA: Diagnosis not present

## 2021-05-26 DIAGNOSIS — Z139 Encounter for screening, unspecified: Secondary | ICD-10-CM | POA: Diagnosis not present

## 2021-05-26 DIAGNOSIS — Z9181 History of falling: Secondary | ICD-10-CM | POA: Diagnosis not present

## 2021-09-20 ENCOUNTER — Ambulatory Visit (INDEPENDENT_AMBULATORY_CARE_PROVIDER_SITE_OTHER): Payer: Medicare Other | Admitting: Family Medicine

## 2021-09-20 ENCOUNTER — Encounter: Payer: Self-pay | Admitting: Family Medicine

## 2021-09-20 VITALS — BP 150/68 | HR 88 | Temp 97.5°F | Resp 16 | Ht 71.0 in | Wt 168.2 lb

## 2021-09-20 DIAGNOSIS — B1920 Unspecified viral hepatitis C without hepatic coma: Secondary | ICD-10-CM

## 2021-09-20 DIAGNOSIS — Z1159 Encounter for screening for other viral diseases: Secondary | ICD-10-CM | POA: Diagnosis not present

## 2021-09-20 DIAGNOSIS — K862 Cyst of pancreas: Secondary | ICD-10-CM | POA: Diagnosis not present

## 2021-09-20 DIAGNOSIS — Z8719 Personal history of other diseases of the digestive system: Secondary | ICD-10-CM | POA: Diagnosis not present

## 2021-09-20 DIAGNOSIS — I10 Essential (primary) hypertension: Secondary | ICD-10-CM | POA: Diagnosis not present

## 2021-09-20 DIAGNOSIS — Z122 Encounter for screening for malignant neoplasm of respiratory organs: Secondary | ICD-10-CM

## 2021-09-20 DIAGNOSIS — F17200 Nicotine dependence, unspecified, uncomplicated: Secondary | ICD-10-CM | POA: Diagnosis not present

## 2021-09-20 DIAGNOSIS — Z9189 Other specified personal risk factors, not elsewhere classified: Secondary | ICD-10-CM | POA: Diagnosis not present

## 2021-09-20 DIAGNOSIS — E782 Mixed hyperlipidemia: Secondary | ICD-10-CM

## 2021-09-20 MED ORDER — HYDRALAZINE HCL 10 MG PO TABS: 10.0000 mg | ORAL_TABLET | Freq: Three times a day (TID) | ORAL | 0 refills | Status: DC

## 2021-09-20 NOTE — Patient Instructions (Signed)
Hypertension: Start hydralazine 10 mg one three times a day.

## 2021-09-20 NOTE — Progress Notes (Signed)
Subjective:  Patient ID: Justin Suarez, male    DOB: [birth year]  Age: 66 y.o. MRN: [id number]  Chief Complaint  Patient presents with   Hypertension   Hyperlipidemia    HPI Hypertension: patient stopped all medicines 6 months ago. Medicine made him feel drained. Did not feel good on them. His bp was and is still high. Bp runs 170/80s. Patient has been on lisinopril in the past but it did not help.   Hyperlipidemia: On crestor until 6 months ago and stopped this also. COPD: Told at [place] [facility], but his breathing is good he says. He is not on any inhalers. Currently smoking. Down to 1/4 ppd, but smoked 1 ppd for 40 years.  Very gassy. Causes abdominal discomfort. Gas x not helped.   Pulmonary embolism in 2013. Patient was on warfarin for 2 years.   Odd history of hepatitis c in chart which patient does not seem to know much about this diagnosis. After patient left, I reiewed Dr. Jenny Reichmann Perry's office visit from 07/2020. Patient was seen for alcoholic pancreatitis, pancreatic pseudocyst, hepatitis C. Dr. Henrene Pastor (GI) was going to refer to ID upon his follow up visit, but I do not see that patient ever followed up.   Current Outpatient Medications on File Prior to Visit  Medication Sig Dispense Refill   amLODipine (NORVASC) 5 MG tablet Take 2 tablets (10 mg total) by mouth daily. (Patient not taking: Reported on 09/20/2021)     amLODipine-olmesartan (AZOR) 5-20 MG tablet Take 1 tablet by mouth daily. (Patient not taking: Reported on 09/20/2021)     methocarbamol (ROBAXIN) 500 MG tablet Take 500 mg by mouth every 6 (six) hours as needed for muscle spasms. (Patient not taking: Reported on 09/20/2021)     rosuvastatin (CRESTOR) 10 MG tablet Take 10 mg by mouth daily. (Patient not taking: Reported on 09/20/2021)     No current facility-administered medications on file prior to visit.   Past Medical History:  Diagnosis Date   Abnormal CT scan, chest 06/2011   multiple lung nodules; needs repeat study  in 3 to 12 months.   Arthritis    Hands   Chronic anticoagulation    Common bile duct dilation    COPD (chronic obstructive pulmonary disease) (HCC)    Pt denies   DVT (deep venous thrombosis) (HCC)    Gallstone pancreatitis    GERD (gastroesophageal reflux disease)    Hepatitis C    History of hiatal hernia    HTN (hypertension)    Noncompliance    Pancreatitis    Pancreatitis 06/2020   Pulmonary embolus (HCC)    Tick bite 07/2011   Tobacco abuse    Past Surgical History:  Procedure Laterality Date   ANKLE SURGERY Right 1998   3 surgeries.   CHOLECYSTECTOMY N/A 05/24/2020   Procedure: LAPAROSCOPIC CHOLECYSTECTOMY WITH INTRAOPERATIVE CHOLANGIOGRAM;  Surgeon: Greer Pickerel, MD;  Location: [facility];  Service: General;  Laterality: N/A;   ESOPHAGOGASTRODUODENOSCOPY (EGD) WITH PROPOFOL N/A 04/01/2020   Procedure: ESOPHAGOGASTRODUODENOSCOPY (EGD) WITH PROPOFOL;  Surgeon: Milus Banister, MD;  Location: [facility];  Service: Endoscopy;  Laterality: N/A;   EUS N/A 04/01/2020   Procedure: UPPER ENDOSCOPIC ULTRASOUND (EUS) RADIAL;  Surgeon: Milus Banister, MD;  Location: [facility];  Service: Endoscopy;  Laterality: N/A;   FOOT SURGERY Right    x 3 from a fall   INGUINAL HERNIA REPAIR Right    years ago    Family History  Problem Relation Age of  Onset   Kidney disease Mother        ESRD dialysis   Diabetes Mother    Heart disease Mother        CHF   Heart failure Mother    Heart disease Father    Heart disease Sister 20       CABG   Coronary artery disease Sister    Coronary artery disease Sister    Heart disease Sister    Cerebral aneurysm Sister    Atrial fibrillation Brother    Hypertension Brother    Esophageal cancer Neg Hx    Colon cancer Neg Hx    Rectal cancer Neg Hx    Social History   Socioeconomic History   Marital status: Divorced    Spouse name: Not on file   Number of children: 0   Years of education: Not on file   Highest education level: Not  on file  Occupational History   Occupation: retired    Comment: Long distance truck driver  Tobacco Use   Smoking status: Every Day    Packs/day: 0.25    Years: 43.00    Total pack years: 10.75    Types: Cigarettes   Smokeless tobacco: Never   Tobacco comments:    1 ppd for 40 years.  Vaping Use   Vaping Use: Never used  Substance and Sexual Activity   Alcohol use: Yes    Comment: Occasional. 4-5 beers 12 oz./week.    Drug use: No   Sexual activity: Not Currently  Other Topics Concern   Not on file  Social History Narrative   Retired Administrator    Social Determinants of Radio broadcast assistant Strain: Not on file  Food Insecurity: Not on file  Transportation Needs: Not on file  Physical Activity: Not on file  Stress: Not on file  Social Connections: Not on file    Review of Systems  Constitutional:  Negative for chills and fever.  HENT:  Negative for congestion, rhinorrhea and sore throat.   Respiratory:  Negative for cough and shortness of breath.   Cardiovascular:  Positive for leg swelling (right foot). Negative for chest pain and palpitations.  Gastrointestinal:  Positive for abdominal pain, constipation, diarrhea and nausea. Negative for vomiting.  Genitourinary:  Negative for dysuria and urgency.  Musculoskeletal:  Positive for arthralgias (hand pain) and back pain. Negative for myalgias.  Neurological:  Positive for light-headedness. Negative for dizziness and headaches.  Psychiatric/Behavioral:  Negative for dysphoric mood. The patient is not nervous/anxious.      Objective:  BP (!) 150/68   Pulse 88   Temp (!) 97.5 F (36.4 C)   Resp 16   Ht '5\' 11"'$  (1.803 m)   Wt 168 lb 3.2 oz (76.3 kg)   BMI 23.46 kg/m      09/27/2021    7:58 AM 09/20/2021    1:58 PM 09/20/2021    1:44 PM  BP/Weight  Systolic BP 616 073 710  Diastolic BP 64 68 70  Wt. (Lbs)   168.2  BMI   23.46 kg/m2    Physical Exam Vitals reviewed.  Constitutional:      Appearance:  Normal appearance.  Neck:     Vascular: No carotid bruit.  Cardiovascular:     Rate and Rhythm: Normal rate and regular rhythm.     Pulses: Normal pulses.     Heart sounds: Normal heart sounds.  Pulmonary:     Effort: Pulmonary effort is normal.  Breath sounds: Normal breath sounds. No wheezing, rhonchi or rales.  Abdominal:     General: Bowel sounds are normal.     Palpations: Abdomen is soft.     Tenderness: There is no abdominal tenderness.     Comments: Hepatomegaly.   Musculoskeletal:     Comments: Right foot scars medially and laterally.   Neurological:     Mental Status: He is alert.  Psychiatric:        Mood and Affect: Mood normal.        Behavior: Behavior normal.     Diabetic Foot Exam - Simple   No data filed      Lab Results  Component Value Date   WBC 6.5 09/27/2021   HGB 13.5 09/27/2021   HCT 39.7 09/27/2021   PLT 259 09/27/2021   GLUCOSE 139 (H) 09/27/2021   CHOL 141 09/27/2021   TRIG 84 09/27/2021   HDL 56 09/27/2021   LDLCALC 69 09/27/2021   ALT 25 09/27/2021   AST 35 09/27/2021   NA 138 09/27/2021   K 4.1 09/27/2021   CL 100 09/27/2021   CREATININE 0.96 09/27/2021   BUN 9 09/27/2021   CO2 19 (L) 09/27/2021   TSH 1.670 09/27/2021   INR 0.92 07/19/2011      Assessment & Plan:   Problem List Items Addressed This Visit       Cardiovascular and Mediastinum   Essential hypertension - Primary    Poorly controlled.  Recommend continue to work on eating healthy diet and exercise. Start hydralazine 10 mg one three times a day.      Relevant Medications   hydrALAZINE (APRESOLINE) 10 MG tablet   Other Relevant Orders   CBC with Differential/Platelet (Completed)   Comprehensive metabolic panel (Completed)   T4, free (Completed)   TSH (Completed)     Other   Mixed hyperlipidemia    Patient on crestor until 6 months ago and stopped this also. Cholesterol is at goal. Continue to work on eating a healthy diet and exercise.  Labs  drawn today.       Relevant Medications   hydrALAZINE (APRESOLINE) 10 MG tablet   Other Relevant Orders   Lipid panel (Completed)   Hepatitis C test positive    Refer back to Dr. Scarlette Shorts, GI. Refer to ID for treatment of hepatitis C.       Relevant Orders   Ambulatory referral to Infectious Disease   Encounter for screening for lung cancer    Patient has 40 pack year history. Recommend screening ct scan.      Relevant Orders   CT CHEST LUNG CANCER SCREENING LOW DOSE WO CONTRAST   Encounter for hepatitis C virus screening test for high risk patient    Positive. Titer up. Refer to ID.       Relevant Orders   HCV Ab w Reflex to Quant PCR (Completed)   Tobacco use disorder    Recommend cessation. He is trying he says.       Relevant Orders   CT CHEST LUNG CANCER SCREENING LOW DOSE WO CONTRAST   Other Visit Diagnoses     Pancreatic cyst       Relevant Orders   Ambulatory referral to Gastroenterology   History of pancreatitis       Relevant Orders   Ambulatory referral to Gastroenterology     .  Meds ordered this encounter  Medications   hydrALAZINE (APRESOLINE) 10 MG tablet    Sig: Take  1 tablet (10 mg total) by mouth 3 (three) times daily.    Dispense:  90 tablet    Refill:  0    Orders Placed This Encounter  Procedures   CT CHEST LUNG CANCER SCREENING LOW DOSE WO CONTRAST   CBC with Differential/Platelet   Comprehensive metabolic panel   Lipid panel   T4, free   TSH   HCV Ab w Reflex to Quant PCR   Ambulatory referral to Gastroenterology   Ambulatory referral to Infectious Disease    Total time spent on today's visit was greater than 45 minutes, including both face-to-face time and nonface-to-face time personally spent on review of chart (labs and imaging), discussing labs and goals, discussing further work-up, treatment options, referrals to specialist if needed, reviewing outside records of pertinent, answering patient's questions, and coordinating  care.  I,Tametria Aho,acting as a Education administrator for Rochel Brome, MD.,have documented all relevant documentation on the behalf of Rochel Brome, MD,as directed by  Rochel Brome, MD while in the presence of Rochel Brome, MD.   Follow-up: Return in about 1 week (around 09/27/2021) for lab visit/BP check. 1 month follow up chronic visit. Marland Kitchen  An After Visit Summary was printed and given to the patient.  Rochel Brome, MD [facility] [phone]

## 2021-09-25 DIAGNOSIS — E782 Mixed hyperlipidemia: Secondary | ICD-10-CM | POA: Insufficient documentation

## 2021-09-25 NOTE — Assessment & Plan Note (Addendum)
Patient on crestor until 6 months ago and stopped this also. Cholesterol is at goal. Continue to work on eating a healthy diet and exercise.  Labs drawn today.

## 2021-09-25 NOTE — Assessment & Plan Note (Addendum)
Poorly controlled.  Recommend continue to work on eating healthy diet and exercise. Start hydralazine 10 mg one three times a day.

## 2021-09-27 ENCOUNTER — Ambulatory Visit: Payer: Medicare Other

## 2021-09-27 VITALS — BP 110/64

## 2021-09-27 DIAGNOSIS — I10 Essential (primary) hypertension: Secondary | ICD-10-CM

## 2021-09-27 DIAGNOSIS — Z9189 Other specified personal risk factors, not elsewhere classified: Secondary | ICD-10-CM | POA: Diagnosis not present

## 2021-09-27 DIAGNOSIS — Z1159 Encounter for screening for other viral diseases: Secondary | ICD-10-CM | POA: Diagnosis not present

## 2021-09-27 DIAGNOSIS — E782 Mixed hyperlipidemia: Secondary | ICD-10-CM | POA: Diagnosis not present

## 2021-09-27 NOTE — Progress Notes (Signed)
Patient aware to continue current medications.

## 2021-09-28 LAB — CBC WITH DIFFERENTIAL/PLATELET
Basophils Absolute: 0.1 10*3/uL (ref 0.0–0.2)
Basos: 1 %
EOS (ABSOLUTE): 0.1 10*3/uL (ref 0.0–0.4)
Eos: 1 %
Hematocrit: 39.7 % (ref 37.5–51.0)
Hemoglobin: 13.5 g/dL (ref 13.0–17.7)
Immature Grans (Abs): 0 10*3/uL (ref 0.0–0.1)
Immature Granulocytes: 0 %
Lymphocytes Absolute: 1.4 10*3/uL (ref 0.7–3.1)
Lymphs: 22 %
MCH: 31.6 pg (ref 26.6–33.0)
MCHC: 34 g/dL (ref 31.5–35.7)
MCV: 93 fL (ref 79–97)
Monocytes Absolute: 0.6 10*3/uL (ref 0.1–0.9)
Monocytes: 10 %
Neutrophils Absolute: 4.3 10*3/uL (ref 1.4–7.0)
Neutrophils: 66 %
Platelets: 259 10*3/uL (ref 150–450)
RBC: 4.27 x10E6/uL (ref 4.14–5.80)
RDW: 15.9 % — ABNORMAL HIGH (ref 11.6–15.4)
WBC: 6.5 10*3/uL (ref 3.4–10.8)

## 2021-09-28 LAB — COMPREHENSIVE METABOLIC PANEL
ALT: 25 IU/L (ref 0–44)
AST: 35 IU/L (ref 0–40)
Albumin/Globulin Ratio: 1.4 (ref 1.2–2.2)
Albumin: 3.8 g/dL — ABNORMAL LOW (ref 3.9–4.9)
Alkaline Phosphatase: 153 IU/L — ABNORMAL HIGH (ref 44–121)
BUN/Creatinine Ratio: 9 — ABNORMAL LOW (ref 10–24)
BUN: 9 mg/dL (ref 8–27)
Bilirubin Total: 0.6 mg/dL (ref 0.0–1.2)
CO2: 19 mmol/L — ABNORMAL LOW (ref 20–29)
Calcium: 9.1 mg/dL (ref 8.6–10.2)
Chloride: 100 mmol/L (ref 96–106)
Creatinine, Ser: 0.96 mg/dL (ref 0.76–1.27)
Globulin, Total: 2.7 g/dL (ref 1.5–4.5)
Glucose: 139 mg/dL — ABNORMAL HIGH (ref 70–99)
Potassium: 4.1 mmol/L (ref 3.5–5.2)
Sodium: 138 mmol/L (ref 134–144)
Total Protein: 6.5 g/dL (ref 6.0–8.5)
eGFR: 87 mL/min/{1.73_m2} (ref >59)

## 2021-09-28 LAB — LIPID PANEL
Chol/HDL Ratio: 2.5 ratio (ref 0.0–5.0)
Cholesterol, Total: 141 mg/dL (ref 100–199)
HDL: 56 mg/dL (ref >39)
LDL Chol Calc (NIH): 69 mg/dL (ref 0–99)
Triglycerides: 84 mg/dL (ref 0–149)
VLDL Cholesterol Cal: 16 mg/dL (ref 5–40)

## 2021-09-28 LAB — HCV AB W REFLEX TO QUANT PCR: HCV Ab: REACTIVE — AB

## 2021-09-28 LAB — TSH: TSH: 1.67 u[IU]/mL (ref 0.450–4.500)

## 2021-09-28 LAB — T4, FREE: Free T4: 1.08 ng/dL (ref 0.82–1.77)

## 2021-09-29 ENCOUNTER — Other Ambulatory Visit: Payer: Self-pay | Admitting: Family Medicine

## 2021-09-29 DIAGNOSIS — R768 Other specified abnormal immunological findings in serum: Secondary | ICD-10-CM

## 2021-09-29 NOTE — Progress Notes (Signed)
Blood count normal.  Liver function normal.  Kidney function normal.  Thyroid function normal.  Cholesterol: at goal Hepatitis c is positive and reflex testing is very high. Recommend refer to Justin Suarez, [facility]. Patient had this in his history, but very little information about it.

## 2021-09-30 ENCOUNTER — Other Ambulatory Visit: Payer: Self-pay

## 2021-09-30 DIAGNOSIS — R768 Other specified abnormal immunological findings in serum: Secondary | ICD-10-CM

## 2021-10-02 ENCOUNTER — Encounter: Payer: Self-pay | Admitting: Family Medicine

## 2021-10-02 DIAGNOSIS — F17200 Nicotine dependence, unspecified, uncomplicated: Secondary | ICD-10-CM | POA: Insufficient documentation

## 2021-10-02 DIAGNOSIS — B182 Chronic viral hepatitis C: Secondary | ICD-10-CM | POA: Insufficient documentation

## 2021-10-02 DIAGNOSIS — Z122 Encounter for screening for malignant neoplasm of respiratory organs: Secondary | ICD-10-CM | POA: Insufficient documentation

## 2021-10-02 DIAGNOSIS — Z1159 Encounter for screening for other viral diseases: Secondary | ICD-10-CM | POA: Insufficient documentation

## 2021-10-02 DIAGNOSIS — B1920 Unspecified viral hepatitis C without hepatic coma: Secondary | ICD-10-CM | POA: Insufficient documentation

## 2021-10-02 NOTE — Assessment & Plan Note (Signed)
Positive. Titer up. Refer to ID.

## 2021-10-02 NOTE — Assessment & Plan Note (Signed)
Refer back to Dr. Scarlette Shorts, GI. Refer to ID for treatment of hepatitis C.

## 2021-10-02 NOTE — Assessment & Plan Note (Signed)
Recommend cessation. He is trying he says.

## 2021-10-02 NOTE — Progress Notes (Signed)
Sent message to amber about referring him back to Dr. Scarlette Shorts and to ID as Dr. Henrene Pastor had recommended instead of Lujean Rave, [place]. Dr. Tobie Poet

## 2021-10-02 NOTE — Assessment & Plan Note (Signed)
Patient has 40 pack year history. Recommend screening ct scan.

## 2021-10-11 ENCOUNTER — Ambulatory Visit (INDEPENDENT_AMBULATORY_CARE_PROVIDER_SITE_OTHER): Payer: Medicare Other | Admitting: Internal Medicine

## 2021-10-11 ENCOUNTER — Encounter: Payer: Self-pay | Admitting: Internal Medicine

## 2021-10-11 ENCOUNTER — Other Ambulatory Visit: Payer: Self-pay

## 2021-10-11 VITALS — BP 205/91 | HR 75 | Temp 97.6°F | Ht 73.0 in | Wt 163.0 lb

## 2021-10-11 DIAGNOSIS — Z1159 Encounter for screening for other viral diseases: Secondary | ICD-10-CM | POA: Diagnosis not present

## 2021-10-11 DIAGNOSIS — B182 Chronic viral hepatitis C: Secondary | ICD-10-CM | POA: Diagnosis not present

## 2021-10-11 DIAGNOSIS — Z9189 Other specified personal risk factors, not elsewhere classified: Secondary | ICD-10-CM

## 2021-10-11 DIAGNOSIS — R768 Other specified abnormal immunological findings in serum: Secondary | ICD-10-CM | POA: Diagnosis not present

## 2021-10-11 NOTE — Assessment & Plan Note (Signed)
  Patient with reported history of genotype 2b hepatitis C infection per chart review.  These results are not readily available but recent HCV Ab reactive.  Will complete rest of HCV screening labs today and follow up in 2 weeks to discuss results and timing of treatment.

## 2021-10-11 NOTE — Patient Instructions (Signed)
Thank you for coming to see me today. It was a pleasure seeing you.  To Do: Labs today Follow up in 2-3 weeks to discuss what we will do for treatment.  If you have any questions or concerns, please do not hesitate to call the office at [phone].  Take Care,   Justin Suarez

## 2021-10-11 NOTE — Progress Notes (Signed)
[facility]  Reason for Consult: Hepatitis C  Referring Provider: Rochel Brome, MD   HPI:    Justin Suarez is a 66 y.o. male with PMHx as below who presents to the clinic for hepatitis C.   Patient is here today as new patient for hepatitis C evaluation. Patient has a reactive HCV Ab from 09/27/21 during visit with PCP.  This was also noted in June 2022 with Dr Henrene Pastor (GI) where noted to have genotype 2B at 776,000 copies.  He has never had treatment and reports he was unaware of previous diagosis.  Risk factors for HCV include sexual activity and was previously in the [employer] in the early 1970's where he received many shots.  No history of blood transfusion, IVDU, intranasal drugs, homemade tattoo's.  He was briefly incarcerated for 1 year in 2015.  His LFTs earlier this month were normal.    Patient's Medications  New Prescriptions   No medications on file  Previous Medications   AMLODIPINE (NORVASC) 5 MG TABLET    Take 2 tablets (10 mg total) by mouth daily.   AMLODIPINE-OLMESARTAN (AZOR) 5-20 MG TABLET    Take 1 tablet by mouth daily.   HYDRALAZINE (APRESOLINE) 10 MG TABLET    Take 1 tablet (10 mg total) by mouth 3 (three) times daily.   METHOCARBAMOL (ROBAXIN) 500 MG TABLET    Take 500 mg by mouth every 6 (six) hours as needed for muscle spasms.   ROSUVASTATIN (CRESTOR) 10 MG TABLET    Take 10 mg by mouth daily.  Modified Medications   No medications on file  Discontinued Medications   No medications on file      Past Medical History:  Diagnosis Date   Abnormal CT scan, chest 06/2011   multiple lung nodules; needs repeat study in 3 to 12 months.   Arthritis    Hands   Chronic anticoagulation    Common bile duct dilation    COPD (chronic obstructive pulmonary disease) (HCC)    Pt denies   DVT (deep venous thrombosis) (HCC)    Gallstone pancreatitis    GERD (gastroesophageal reflux disease)    Hepatitis C    History of hiatal hernia     HTN (hypertension)    Noncompliance    Pancreatitis    Pancreatitis 06/2020   Pulmonary embolus ([place])    Tick bite 07/2011   Tobacco abuse     Social History   Tobacco Use   Smoking status: Every Day    Packs/day: 0.25    Years: 43.00    Total pack years: 10.75    Types: Cigarettes   Smokeless tobacco: Never   Tobacco comments:    1 ppd for 40 years.  Vaping Use   Vaping Use: Never used  Substance Use Topics   Alcohol use: Yes    Comment: Occasional. 4-5 beers 12 oz./week.    Drug use: No    Family History  Problem Relation Age of Onset   Kidney disease Mother        ESRD dialysis   Diabetes Mother    Heart disease Mother        CHF   Heart failure Mother    Heart disease Father    Heart disease Sister 43       CABG   Coronary artery disease Sister    Coronary artery disease Sister    Heart disease Sister    Cerebral aneurysm Sister  Atrial fibrillation Brother    Hypertension Brother    Esophageal cancer Neg Hx    Colon cancer Neg Hx    Rectal cancer Neg Hx     No Known Allergies  Review of Systems  Constitutional: Negative.   Respiratory: Negative.    Cardiovascular: Negative.   Gastrointestinal: Negative.       OBJECTIVE:    Vitals:   10/11/21 0837  BP: (!) 205/91  Pulse: 75  Temp: 97.6 F (36.4 C)  TempSrc: Oral  SpO2: 99%  Weight: 163 lb (73.9 kg)  Height: '6\' 1"'$  (1.854 m)     Body mass index is 21.51 kg/m.  Physical Exam Constitutional:      General: He is not in acute distress.    Appearance: Normal appearance.  HENT:     Head: Normocephalic and atraumatic.  Eyes:     Extraocular Movements: Extraocular movements intact.     Conjunctiva/sclera: Conjunctivae normal.  Pulmonary:     Effort: Pulmonary effort is normal. No respiratory distress.  Abdominal:     General: There is no distension.     Palpations: Abdomen is soft.  Musculoskeletal:        General: Normal range of motion.     Cervical back: Normal range of  motion and neck supple.  Skin:    General: Skin is warm and dry.  Neurological:     General: No focal deficit present.     Mental Status: He is alert and oriented to person, place, and time.  Psychiatric:        Mood and Affect: Mood normal.        Behavior: Behavior normal.      Labs and Microbiology:     Latest Ref Rng & Units 09/27/2021    7:59 AM 05/25/2020    3:48 AM 05/24/2020    2:56 AM  CBC  WBC 3.4 - 10.8 x10E3/uL 6.5  6.8  4.5   Hemoglobin 13.0 - 17.7 g/dL 13.5  12.9  12.5   Hematocrit 37.5 - 51.0 % 39.7  38.9  36.9   Platelets 150 - 450 x10E3/uL 259  211  209       Latest Ref Rng & Units 09/27/2021    7:59 AM 05/25/2020    3:48 AM 05/24/2020    2:56 AM  CMP  Glucose 70 - 99 mg/dL 139  161  110   BUN 8 - 27 mg/dL 9  8  <5   Creatinine 0.76 - 1.27 mg/dL 0.96  0.89  0.80   Sodium 134 - 144 mmol/L 138  137  137   Potassium 3.5 - 5.2 mmol/L 4.1  4.0  3.6   Chloride 96 - 106 mmol/L 100  104  102   CO2 20 - 29 mmol/L '19  25  27   '$ Calcium 8.6 - 10.2 mg/dL 9.1  8.8  8.5   Total Protein 6.0 - 8.5 g/dL 6.5  6.3  5.8   Total Bilirubin 0.0 - 1.2 mg/dL 0.6  0.6  0.7   Alkaline Phos 44 - 121 IU/L 153  59  58   AST 0 - 40 IU/L 35  27  20   ALT 0 - 44 IU/L '25  18  14       '$ ASSESSMENT & PLAN:    Chronic viral hepatitis C ([place])  Patient with reported history of genotype 2b hepatitis C infection per chart review.  These results are not readily available but recent HCV Ab  reactive.  Will complete rest of HCV screening labs today and follow up in 2 weeks to discuss results and timing of treatment.     Orders Placed This Encounter  Procedures   Hepatitis C genotype   Hepatitis C RNA quantitative   Liver Fibrosis, FibroTest-ActiTest   Protime-INR   CBC   Hepatitis A antibody, total   HIV Antibody (routine testing w rflx)   COMPLETE METABOLIC PANEL WITH GFR   Hepatitis B core antibody, total   Hepatitis B surface antibody,qualitative   Hepatitis B surface antigen        Rayn[facility] for Infectious Disease [facility] 10/11/2021, 9:03 AM

## 2021-10-12 ENCOUNTER — Ambulatory Visit (HOSPITAL_COMMUNITY)
Admission: RE | Admit: 2021-10-12 | Discharge: 2021-10-12 | Disposition: A | Discharge status: Home / Self Care | Payer: Medicare Other | Source: Ambulatory Visit | Attending: Family Medicine | Admitting: Family Medicine

## 2021-10-12 ENCOUNTER — Encounter (HOSPITAL_COMMUNITY): Payer: Self-pay

## 2021-10-12 DIAGNOSIS — F17210 Nicotine dependence, cigarettes, uncomplicated: Secondary | ICD-10-CM | POA: Diagnosis not present

## 2021-10-12 DIAGNOSIS — Z122 Encounter for screening for malignant neoplasm of respiratory organs: Secondary | ICD-10-CM | POA: Diagnosis not present

## 2021-10-12 DIAGNOSIS — I700 Atherosclerosis of aorta: Secondary | ICD-10-CM | POA: Diagnosis not present

## 2021-10-12 DIAGNOSIS — I2510 Atherosclerotic heart disease of native coronary artery without angina pectoris: Secondary | ICD-10-CM | POA: Diagnosis not present

## 2021-10-12 DIAGNOSIS — J432 Centrilobular emphysema: Secondary | ICD-10-CM | POA: Insufficient documentation

## 2021-10-14 ENCOUNTER — Other Ambulatory Visit: Payer: Self-pay | Admitting: Family Medicine

## 2021-10-14 DIAGNOSIS — I10 Essential (primary) hypertension: Secondary | ICD-10-CM

## 2021-10-15 LAB — HEPATITIS C GENOTYPE: HCV Genotype: 2

## 2021-10-15 LAB — LIVER FIBROSIS, FIBROTEST-ACTITEST
ALT: 21 U/L (ref 9–46)
Alpha-2-Macroglobulin: 255 mg/dL (ref 106–279)
Apolipoprotein A1: 145 mg/dL (ref 94–176)
Bilirubin: 0.2 mg/dL (ref 0.2–1.2)
Fibrosis Score: 0.25
GGT: 69 U/L (ref 3–70)
Haptoglobin: 286 mg/dL — ABNORMAL HIGH (ref 43–212)
Necroinflammat ACT Score: 0.08
Reference ID: 4519762

## 2021-10-15 LAB — COMPLETE METABOLIC PANEL WITH GFR
AG Ratio: 1.2 (calc) (ref 1.0–2.5)
ALT: 20 U/L (ref 9–46)
AST: 31 U/L (ref 10–35)
Albumin: 3.8 g/dL (ref 3.6–5.1)
Alkaline phosphatase (APISO): 117 U/L (ref 35–144)
BUN: 9 mg/dL (ref 7–25)
CO2: 27 mmol/L (ref 20–32)
Calcium: 8.9 mg/dL (ref 8.6–10.3)
Chloride: 101 mmol/L (ref 98–110)
Creat: 0.77 mg/dL (ref 0.70–1.35)
Globulin: 3.2 g/dL (calc) (ref 1.9–3.7)
Glucose, Bld: 89 mg/dL (ref 65–99)
Potassium: 4.6 mmol/L (ref 3.5–5.3)
Sodium: 138 mmol/L (ref 135–146)
Total Bilirubin: 0.2 mg/dL (ref 0.2–1.2)
Total Protein: 7 g/dL (ref 6.1–8.1)
eGFR: 99 mL/min/{1.73_m2} (ref >=60)

## 2021-10-15 LAB — CBC
HCT: 38.5 % (ref 38.5–50.0)
Hemoglobin: 13 g/dL — ABNORMAL LOW (ref 13.2–17.1)
MCH: 31.8 pg (ref 27.0–33.0)
MCHC: 33.8 g/dL (ref 32.0–36.0)
MCV: 94.1 fL (ref 80.0–100.0)
MPV: 8.9 fL (ref 7.5–12.5)
Platelets: 330 10*3/uL (ref 140–400)
RBC: 4.09 10*6/uL — ABNORMAL LOW (ref 4.20–5.80)
RDW: 14.9 % (ref 11.0–15.0)
WBC: 3.7 10*3/uL — ABNORMAL LOW (ref 3.8–10.8)

## 2021-10-15 LAB — HIV ANTIBODY (ROUTINE TESTING W REFLEX): HIV 1&2 Ab, 4th Generation: NONREACTIVE

## 2021-10-15 LAB — HEPATITIS B SURFACE ANTIGEN: Hepatitis B Surface Ag: NONREACTIVE

## 2021-10-15 LAB — PROTIME-INR
INR: 0.9
Prothrombin Time: 9.9 s (ref 9.0–11.5)

## 2021-10-15 LAB — HEPATITIS B CORE ANTIBODY, TOTAL: Hep B Core Total Ab: NONREACTIVE

## 2021-10-15 LAB — HEPATITIS C RNA QUANTITATIVE
HCV Quantitative Log: 7.05 log IU/mL — ABNORMAL HIGH
HCV RNA, PCR, QN: 11300000 IU/mL — ABNORMAL HIGH

## 2021-10-15 LAB — HEPATITIS A ANTIBODY, TOTAL: Hepatitis A AB,Total: NONREACTIVE

## 2021-10-15 LAB — HEPATITIS B SURFACE ANTIBODY,QUALITATIVE: Hep B S Ab: NONREACTIVE

## 2021-10-17 ENCOUNTER — Other Ambulatory Visit (HOSPITAL_COMMUNITY): Payer: Self-pay

## 2021-10-17 ENCOUNTER — Telehealth: Payer: Self-pay

## 2021-10-17 ENCOUNTER — Other Ambulatory Visit: Payer: Self-pay | Admitting: Family Medicine

## 2021-10-17 DIAGNOSIS — I700 Atherosclerosis of aorta: Secondary | ICD-10-CM

## 2021-10-17 DIAGNOSIS — K8689 Other specified diseases of pancreas: Secondary | ICD-10-CM

## 2021-10-17 MED ORDER — ROSUVASTATIN CALCIUM 10 MG PO TABS: 10.0000 mg | ORAL_TABLET | Freq: Every day | ORAL | 0 refills | Status: DC

## 2021-10-17 NOTE — Telephone Encounter (Signed)
[facility] Patient Advocate Encounter   Received notification from [employer] that prior authorization for Reubens is required.   PA submitted on 10/17/21 Key [id number] Status is pending    [facility] will continue to follow.   Ileene Patrick, [facility] Patient [facility] for Infectious Disease Phone: [phone] Fax:  [phone]

## 2021-10-18 ENCOUNTER — Telehealth: Payer: Self-pay | Admitting: Pharmacist

## 2021-10-18 ENCOUNTER — Other Ambulatory Visit (HOSPITAL_COMMUNITY): Payer: Self-pay

## 2021-10-18 ENCOUNTER — Telehealth: Payer: Self-pay

## 2021-10-18 ENCOUNTER — Other Ambulatory Visit: Payer: Self-pay | Admitting: Pharmacist

## 2021-10-18 DIAGNOSIS — B182 Chronic viral hepatitis C: Secondary | ICD-10-CM

## 2021-10-18 MED ORDER — MAVYRET 100-40 MG PO TABS
3.0000 | ORAL_TABLET | Freq: Every day | ORAL | 1 refills | Status: DC
  Filled 2021-10-18: qty 84, 28d supply, fill #0
  Filled 2021-10-18: qty 84, fill #0
  Filled 2021-11-09: qty 84, 28d supply, fill #1

## 2021-10-18 NOTE — Telephone Encounter (Signed)
Patient is approved to receive Mavyret x 8 weeks for chronic Hepatitis C infection. Counseled patient to take all three tablets of Mavyret daily with food.  Counseled patient the need to take all three tablets together and to not separate them out during the day. Encouraged patient not to miss any doses and explained how their chance of cure could go down with each dose missed. Counseled patient on what to do if dose is missed - if it is closer to the missed dose take immediately; if closer to next dose then skip dose and take the next dose at the usual time. Counseled patient on common side effects such as headache, fatigue, and nausea and that these normally decrease with time. I reviewed patient medications and found 0 drug interactions. Discussed with patient that there are several drug interactions with Mavyret and instructed patient to call the clinic if he wishes to start a new medication during course of therapy. Also advised patient to call if he experiences any side effects. Patient will follow-up with me in the pharmacy clinic on 11/15/21.   Adria Dill, PharmD PGY-2 Infectious Diseases Resident  10/18/2021 4:54 PM

## 2021-10-18 NOTE — Telephone Encounter (Signed)
[facility] Patient Advocate Encounter  Prior Authorization for Mavyret has been approved.    PA# [id number] Effective dates: 10/17/21 through 12/12/21  Patients co-pay is $0.00.   Prescription can be filled at [facility].  [facility] will continue to follow.  Ileene Patrick, [facility] Patient [facility] for Infectious Disease Phone: [phone] Fax:  [phone]

## 2021-10-19 ENCOUNTER — Other Ambulatory Visit: Payer: Self-pay | Admitting: Family Medicine

## 2021-10-19 DIAGNOSIS — K8689 Other specified diseases of pancreas: Secondary | ICD-10-CM

## 2021-10-25 ENCOUNTER — Ambulatory Visit (INDEPENDENT_AMBULATORY_CARE_PROVIDER_SITE_OTHER): Payer: Medicare Other | Admitting: Nurse Practitioner

## 2021-10-25 ENCOUNTER — Other Ambulatory Visit (HOSPITAL_COMMUNITY): Payer: Self-pay

## 2021-10-25 ENCOUNTER — Encounter: Payer: Self-pay | Admitting: Nurse Practitioner

## 2021-10-25 VITALS — BP 100/62 | HR 96 | Temp 97.3°F | Ht 73.0 in | Wt 162.8 lb

## 2021-10-25 DIAGNOSIS — F17211 Nicotine dependence, cigarettes, in remission: Secondary | ICD-10-CM

## 2021-10-25 DIAGNOSIS — I10 Essential (primary) hypertension: Secondary | ICD-10-CM

## 2021-10-25 DIAGNOSIS — B1920 Unspecified viral hepatitis C without hepatic coma: Secondary | ICD-10-CM

## 2021-10-25 NOTE — Patient Instructions (Addendum)
Continue medications as prescribed We will call you with lab results Continue low salt diet and regular physical activity Follow-up in 68-month with Dr CTobie Poet  Managing Your Hypertension Hypertension, also called high blood pressure, is when the force of the blood pressing against the walls of the arteries is too strong. Arteries are blood vessels that carry blood from your heart throughout your body. Hypertension forces the heart to work harder to pump blood and may cause the arteries to become narrow or stiff. Understanding blood pressure readings A blood pressure reading includes a higher number over a lower number: The first, or top, number is called the systolic pressure. It is a measure of the pressure in your arteries as your heart beats. The second, or bottom number, is called the diastolic pressure. It is a measure of the pressure in your arteries as the heart relaxes. For most people, a normal blood pressure is below 120/80. Your personal target blood pressure may vary depending on your medical conditions, your age, and other factors. Blood pressure is classified into four stages. Based on your blood pressure reading, your health care provider may use the following stages to determine what type of treatment you need, if any. Systolic pressure and diastolic pressure are measured in a unit called millimeters of mercury (mmHg). Normal Systolic pressure: below 1433 Diastolic pressure: below 80. Elevated Systolic pressure: 1295-188 Diastolic pressure: below 80. Hypertension stage 1 Systolic pressure: 1416-606 Diastolic pressure: 830-16 Hypertension stage 2 Systolic pressure: 1010or above. Diastolic pressure: 90 or above. How can this condition affect me? Managing your hypertension is very important. Over time, hypertension can damage the arteries and decrease blood flow to parts of the body, including the brain, heart, and kidneys. Having untreated or uncontrolled hypertension can lead  to: A heart attack. A stroke. A weakened blood vessel (aneurysm). Heart failure. Kidney damage. Eye damage. Memory and concentration problems. Vascular dementia. What actions can I take to manage this condition? Hypertension can be managed by making lifestyle changes and possibly by taking medicines. Your health care provider will help you make a plan to bring your blood pressure within a normal range. You may be referred for counseling on a healthy diet and physical activity. Nutrition  Eat a diet that is high in fiber and potassium, and low in salt (sodium), added sugar, and fat. An example eating plan is called the DASH diet. DASH stands for Dietary Approaches to Stop Hypertension. To eat this way: Eat plenty of fresh fruits and vegetables. Try to fill one-half of your plate at each meal with fruits and vegetables. Eat whole grains, such as whole-wheat pasta, brown rice, or whole-grain bread. Fill about one-fourth of your plate with whole grains. Eat low-fat dairy products. Avoid fatty cuts of meat, processed or cured meats, and poultry with skin. Fill about one-fourth of your plate with lean proteins such as fish, chicken without skin, beans, eggs, and tofu. Avoid pre-made and processed foods. These tend to be higher in sodium, added sugar, and fat. Reduce your daily sodium intake. Many people with hypertension should eat less than 1,500 mg of sodium a day. Lifestyle  Work with your health care provider to maintain a healthy body weight or to lose weight. Ask what an ideal weight is for you. Get at least 30 minutes of exercise that causes your heart to beat faster (aerobic exercise) most days of the week. Activities may include walking, swimming, or biking. Include exercise to strengthen your muscles (resistance exercise), such  as weight lifting, as part of your weekly exercise routine. Try to do these types of exercises for 30 minutes at least 3 days a week. Do not use any products that  contain nicotine or tobacco. These products include cigarettes, chewing tobacco, and vaping devices, such as e-cigarettes. If you need help quitting, ask your health care provider. Control any long-term (chronic) conditions you have, such as high cholesterol or diabetes. Identify your sources of stress and find ways to manage stress. This may include meditation, deep breathing, or making time for fun activities. Alcohol use Do not drink alcohol if: Your health care provider tells you not to drink. You are pregnant, may be pregnant, or are planning to become pregnant. If you drink alcohol: Limit how much you have to: 0-1 drink a day for women. 0-2 drinks a day for men. Know how much alcohol is in your drink. In the [place] one drink equals one 12 oz bottle of beer (355 mL), one 5 oz glass of wine (148 mL), or one 1 oz glass of hard liquor (44 mL). Medicines Your health care provider may prescribe medicine if lifestyle changes are not enough to get your blood pressure under control and if: Your systolic blood pressure is 130 or higher. Your diastolic blood pressure is 80 or higher. Take medicines only as told by your health care provider. Follow the directions carefully. Blood pressure medicines must be taken as told by your health care provider. The medicine does not work as well when you skip doses. Skipping doses also puts you at risk for problems. Monitoring Before you monitor your blood pressure: Do not smoke, drink caffeinated beverages, or exercise within 30 minutes before taking a measurement. Use the bathroom and empty your bladder (urinate). Sit quietly for at least 5 minutes before taking measurements. Monitor your blood pressure at home as told by your health care provider. To do this: Sit with your back straight and supported. Place your feet flat on the floor. Do not cross your legs. Support your arm on a flat surface, such as a table. Make sure your upper arm is at heart  level. Each time you measure, take two or three readings one minute apart and record the results. You may also need to have your blood pressure checked regularly by your health care provider. General information Talk with your health care provider about your diet, exercise habits, and other lifestyle factors that may be contributing to hypertension. Review all the medicines you take with your health care provider because there may be side effects or interactions. Keep all follow-up visits. Your health care provider can help you create and adjust your plan for managing your high blood pressure. Where to find more information National Heart, Lung, and Blood Institute: https://wilson-eaton.com/ American Heart Association: www.heart.org Contact a health care provider if: You think you are having a reaction to medicines you have taken. You have repeated (recurrent) headaches. You feel dizzy. You have swelling in your ankles. You have trouble with your vision. Get help right away if: You develop a severe headache or confusion. You have unusual weakness or numbness, or you feel faint. You have severe pain in your chest or abdomen. You vomit repeatedly. You have trouble breathing. These symptoms may be an emergency. Get help right away. Call 911. Do not wait to see if the symptoms will go away. Do not drive yourself to the hospital. Summary Hypertension is when the force of blood pumping through your arteries is too strong.  If this condition is not controlled, it may put you at risk for serious complications. Your personal target blood pressure may vary depending on your medical conditions, your age, and other factors. For most people, a normal blood pressure is less than 120/80. Hypertension is managed by lifestyle changes, medicines, or both. Lifestyle changes to help manage hypertension include losing weight, eating a healthy, low-sodium diet, exercising more, stopping smoking, and limiting  alcohol. This information is not intended to replace advice given to you by your health care provider. Make sure you discuss any questions you have with your health care provider. Document Revised: 10/21/2020 Document Reviewed: 10/21/2020 Elsevier Patient Education  [place].

## 2021-10-25 NOTE — Progress Notes (Signed)
Subjective:  Patient ID: Justin Suarez, male    DOB: [birth year]  Age: 66 y.o. MRN: [id number]  Chief Complaint  Patient presents with   Hypertension    1 month follow up    HPI: Pt presents for follow-up of hypertension. BP 100/62 today in office. Pt denies dizziness, lightheadedness, or blurred vision. States he rode his bicycle to the office today,  reports temperature outdoors 92 degrees today. States he is pushing fluids.   Pt was recently diagnosed with Hep C. Currently prescribed Mavyret per Infectious Diseases Dept. He is scheduled to see liver specialist in a few weeks. Pt states he is scheduled to have an MRI of abd concerning chronic abd pain related to a hiatal hernia.   Hypertension, follow-up: He was last seen for hypertension 4 weeks ago.  BP at that visit was 150/68. Management includes Norvasc and Hydralazine. He reports excellent compliance with treatment. He is not having side effects.  He is following a Regular diet. He is exercising. He does not smoke. Use of agents associated with hypertension: none.  Outside blood pressures are not being checked.  Pertinent labs: Lab Results  Component Value Date   CHOL 141 09/27/2021   HDL 56 09/27/2021   LDLCALC 69 09/27/2021   TRIG 84 09/27/2021   CHOLHDL 2.5 09/27/2021   Lab Results  Component Value Date   NA 138 10/11/2021   K 4.6 10/11/2021   CREATININE 0.77 10/11/2021   EGFR 99 10/11/2021   GFRNONAA >60 05/25/2020   GLUCOSE 89 10/11/2021     The 10-year ASCVD risk score (Arnett DK, et al., 2019) is: 10.6%      Current Outpatient Medications on File Prior to Visit  Medication Sig Dispense Refill   amLODipine (NORVASC) 5 MG tablet Take 2 tablets (10 mg total) by mouth daily.     hydrALAZINE (APRESOLINE) 10 MG tablet TAKE 1 TABLET BY MOUTH THREE TIMES A DAY 90 tablet 2   rosuvastatin (CRESTOR) 10 MG tablet Take 1 tablet (10 mg total) by mouth daily. 90 tablet 0   amLODipine-olmesartan (AZOR) 5-20 MG  tablet Take 1 tablet by mouth daily. (Patient not taking: Reported on 09/20/2021)     Glecaprevir-Pibrentasvir (MAVYRET) 100-40 MG TABS Take 3 tablets by mouth daily with breakfast. (Patient not taking: Reported on 10/25/2021) 84 tablet 1   No current facility-administered medications on file prior to visit.   Past Medical History:  Diagnosis Date   Abnormal CT scan, chest 06/2011   multiple lung nodules; needs repeat study in 3 to 12 months.   Arthritis    Hands   Chronic anticoagulation    Common bile duct dilation    COPD (chronic obstructive pulmonary disease) (HCC)    Pt denies   DVT (deep venous thrombosis) (HCC)    Gallstone pancreatitis    GERD (gastroesophageal reflux disease)    Hepatitis C    History of hiatal hernia    HTN (hypertension)    Noncompliance    Pancreatitis    Pancreatitis 06/2020   Pulmonary embolus (HCC)    Tick bite 07/2011   Tobacco abuse    Past Surgical History:  Procedure Laterality Date   ANKLE SURGERY Right 1998   3 surgeries.   CHOLECYSTECTOMY N/A 05/24/2020   Procedure: LAPAROSCOPIC CHOLECYSTECTOMY WITH INTRAOPERATIVE CHOLANGIOGRAM;  Surgeon: Greer Pickerel, MD;  Location: [facility];  Service: General;  Laterality: N/A;   ESOPHAGOGASTRODUODENOSCOPY (EGD) WITH PROPOFOL N/A 04/01/2020   Procedure: ESOPHAGOGASTRODUODENOSCOPY (EGD) WITH PROPOFOL;  Surgeon: Milus Banister, MD;  Location: [facility];  Service: Endoscopy;  Laterality: N/A;   EUS N/A 04/01/2020   Procedure: UPPER ENDOSCOPIC ULTRASOUND (EUS) RADIAL;  Surgeon: Milus Banister, MD;  Location: [facility];  Service: Endoscopy;  Laterality: N/A;   FOOT SURGERY Right    x 3 from a fall   INGUINAL HERNIA REPAIR Right    years ago    Family History  Problem Relation Age of Onset   Kidney disease Mother        ESRD dialysis   Diabetes Mother    Heart disease Mother        CHF   Heart failure Mother    Heart disease Father    Heart disease Sister 24       CABG   Coronary artery  disease Sister    Coronary artery disease Sister    Heart disease Sister    Cerebral aneurysm Sister    Atrial fibrillation Brother    Hypertension Brother    Esophageal cancer Neg Hx    Colon cancer Neg Hx    Rectal cancer Neg Hx    Social History   Socioeconomic History   Marital status: Divorced    Spouse name: Not on file   Number of children: 0   Years of education: Not on file   Highest education level: Not on file  Occupational History   Occupation: retired    Comment: Long distance truck driver  Tobacco Use   Smoking status: Every Day    Packs/day: 0.25    Years: 43.00    Total pack years: 10.75    Types: Cigarettes   Smokeless tobacco: Never   Tobacco comments:    1 ppd for 40 years.  Vaping Use   Vaping Use: Never used  Substance and Sexual Activity   Alcohol use: Yes    Comment: Occasional. 4-5 beers 12 oz./week.    Drug use: No   Sexual activity: Not Currently  Other Topics Concern   Not on file  Social History Narrative   Retired Administrator    Social Determinants of Radio broadcast assistant Strain: Not on file  Food Insecurity: Not on file  Transportation Needs: Not on file  Physical Activity: Not on file  Stress: Not on file  Social Connections: Not on file    Review of Systems  Constitutional:  Negative for appetite change, fatigue and fever.  HENT:  Negative for congestion, ear pain, sinus pressure and sore throat.   Respiratory:  Negative for cough, chest tightness, shortness of breath and wheezing.   Cardiovascular:  Negative for chest pain and palpitations.  Gastrointestinal:  Negative for abdominal pain, constipation, diarrhea, nausea and vomiting.  Genitourinary:  Negative for dysuria and hematuria.  Musculoskeletal:  Negative for arthralgias, back pain, joint swelling and myalgias.  Skin:  Negative for rash.  Neurological:  Negative for dizziness, weakness and headaches.  Psychiatric/Behavioral:  Negative for dysphoric mood. The  patient is not nervous/anxious.      Objective:  BP 100/62 (BP Location: Left Arm, Patient Position: Sitting)   Pulse 96   Temp (!) 97.3 F (36.3 C) (Temporal)   Ht 6' 1" (1.854 m)   Wt 162 lb 12.8 oz (73.8 kg)   SpO2 95%   BMI 21.48 kg/m      10/25/2021    1:35 PM 10/11/2021    8:37 AM 09/27/2021    7:58 AM  BP/Weight  Systolic BP 785 885  732  Diastolic BP 62 91 64  Wt. (Lbs) 162.8 163   BMI 21.48 kg/m2 21.51 kg/m2     Physical Exam Vitals reviewed.  Constitutional:      Appearance: Normal appearance. He is normal weight.  Cardiovascular:     Rate and Rhythm: Normal rate and regular rhythm.     Heart sounds: Normal heart sounds.  Pulmonary:     Effort: Pulmonary effort is normal.     Breath sounds: Normal breath sounds.  Abdominal:     General: Abdomen is flat. Bowel sounds are normal.     Palpations: Abdomen is soft.  Neurological:     Mental Status: He is alert and oriented to person, place, and time.  Psychiatric:        Mood and Affect: Mood normal.        Behavior: Behavior normal.    Lab Results  Component Value Date   WBC 3.7 (L) 10/11/2021   HGB 13.0 (L) 10/11/2021   HCT 38.5 10/11/2021   PLT 330 10/11/2021   GLUCOSE 89 10/11/2021   CHOL 141 09/27/2021   TRIG 84 09/27/2021   HDL 56 09/27/2021   LDLCALC 69 09/27/2021   ALT 21 10/11/2021   ALT 20 10/11/2021   AST 31 10/11/2021   NA 138 10/11/2021   K 4.6 10/11/2021   CL 101 10/11/2021   CREATININE 0.77 10/11/2021   BUN 9 10/11/2021   CO2 27 10/11/2021   TSH 1.670 09/27/2021   INR 0.9 10/11/2021      Assessment & Plan:   1. Essential hypertension-well controlled - Comprehensive metabolic panel  2. Hepatitis C test positive - Comprehensive metabolic panel -continue follow-up with liver specialist as scheduled  3. Cigarette nicotine dependence in remission     Continue medications as prescribed We will call you with lab results Continue low salt diet and regular physical  activity Follow-up in 38-month with Dr CTobie Poet   Follow-up: 332-monthollow-up with Dr CoTobie PoetAn After Visit Summary was printed and given to the patient.   I, ShRip HarbourNP, have reviewed all documentation for this visit. The documentation on 10/25/21 for the exam, diagnosis, procedures, and orders are all accurate and complete.   I,Lauren M Auman,acting as a scEducation administratoror ShCIT GroupNP.,have documented all relevant documentation on the behalf of ShRip HarbourNP,as directed by  ShRip HarbourNP while in the presence of ShRip HarbourNP.   Signed, ShRip HarbourNP [phone]

## 2021-10-28 LAB — COMPREHENSIVE METABOLIC PANEL
ALT: 33 IU/L (ref 0–44)
AST: 30 IU/L (ref 0–40)
Albumin/Globulin Ratio: 1.3 (ref 1.2–2.2)
Albumin: 3.9 g/dL (ref 3.9–4.9)
Alkaline Phosphatase: 245 IU/L — ABNORMAL HIGH (ref 44–121)
BUN/Creatinine Ratio: 14 (ref 10–24)
BUN: 11 mg/dL (ref 8–27)
Bilirubin Total: 0.4 mg/dL (ref 0.0–1.2)
CO2: 14 mmol/L — ABNORMAL LOW (ref 20–29)
Calcium: 8.7 mg/dL (ref 8.6–10.2)
Chloride: 100 mmol/L (ref 96–106)
Creatinine, Ser: 0.79 mg/dL (ref 0.76–1.27)
Globulin, Total: 2.9 g/dL (ref 1.5–4.5)
Glucose: 87 mg/dL (ref 70–99)
Potassium: 4.9 mmol/L (ref 3.5–5.2)
Sodium: 136 mmol/L (ref 134–144)
Total Protein: 6.8 g/dL (ref 6.0–8.5)
eGFR: 98 mL/min/{1.73_m2} (ref >59)

## 2021-11-01 ENCOUNTER — Other Ambulatory Visit: Payer: Self-pay | Admitting: Family Medicine

## 2021-11-01 ENCOUNTER — Ambulatory Visit: Payer: Medicare Other | Admitting: Internal Medicine

## 2021-11-01 ENCOUNTER — Ambulatory Visit (HOSPITAL_COMMUNITY)
Admission: RE | Admit: 2021-11-01 | Discharge: 2021-11-01 | Disposition: A | Discharge status: Home / Self Care | Payer: Medicare Other | Source: Ambulatory Visit | Attending: Family Medicine | Admitting: Family Medicine

## 2021-11-01 DIAGNOSIS — K8689 Other specified diseases of pancreas: Secondary | ICD-10-CM

## 2021-11-01 NOTE — Progress Notes (Signed)
Patient was a no-show 

## 2021-11-08 ENCOUNTER — Other Ambulatory Visit (HOSPITAL_COMMUNITY): Payer: Self-pay

## 2021-11-09 ENCOUNTER — Other Ambulatory Visit (HOSPITAL_COMMUNITY): Payer: Self-pay

## 2021-11-11 ENCOUNTER — Telehealth: Payer: Self-pay

## 2021-11-11 NOTE — Telephone Encounter (Signed)
[facility] Patient Advocate Encounter  Patient's medications have been couriered to [facility] from [facility] and will be picked up 11/15/21.  I had patient 2nd box of Mavyret script to be couriered to the clinic , [facility] and I have been reaching out to the patient and was unable to reach the patient. 1st box was shipped to patient home.  Ileene Patrick , [facility] Patient [facility] Phone: [phone] Fax:  [phone]

## 2021-11-15 ENCOUNTER — Other Ambulatory Visit: Payer: Self-pay

## 2021-11-15 ENCOUNTER — Ambulatory Visit (INDEPENDENT_AMBULATORY_CARE_PROVIDER_SITE_OTHER): Payer: Medicare Other | Admitting: Pharmacist

## 2021-11-15 DIAGNOSIS — B182 Chronic viral hepatitis C: Secondary | ICD-10-CM | POA: Diagnosis not present

## 2021-11-15 DIAGNOSIS — Z23 Encounter for immunization: Secondary | ICD-10-CM | POA: Diagnosis not present

## 2021-11-15 NOTE — Progress Notes (Signed)
HPI: Justin Suarez is a 66 y.o. male who presents to the [facility] clinic for Hepatitis C follow-up.  Medication: Mavyret x 8 weeks  Start Date: 10/20/21  Hepatitis C Genotype: 2  Fibrosis Score: F0-F1  Hepatitis C RNA: 11.3 million (10/11/21)   Patient Active Problem List   Diagnosis Date Noted   Hepatitis C test positive 10/02/2021   Encounter for screening for lung cancer 10/02/2021   Chronic viral hepatitis C ([place]) 10/02/2021   Tobacco use disorder 10/02/2021   Mixed hyperlipidemia 09/25/2021   Hyponatremia 05/22/2020   Essential hypertension 05/22/2020   Chest pain 08/07/2011   COPD (chronic obstructive pulmonary disease) ([place]) 04/13/2011   Pulmonary embolus ([place]) 03/15/2011   Tobacco abuse 03/15/2011   Long term (current) use of anticoagulants 03/15/2011   DVT, lower extremity ([place]) 03/15/2011    Patient's Medications  New Prescriptions   No medications on file  Previous Medications   AMLODIPINE (NORVASC) 5 MG TABLET    Take 2 tablets (10 mg total) by mouth daily.   GLECAPREVIR-PIBRENTASVIR (MAVYRET) 100-40 MG TABS    Take 3 tablets by mouth daily with breakfast.   HYDRALAZINE (APRESOLINE) 10 MG TABLET    TAKE 1 TABLET BY MOUTH THREE TIMES A DAY   ROSUVASTATIN (CRESTOR) 10 MG TABLET    Take 1 tablet (10 mg total) by mouth daily.  Modified Medications   No medications on file  Discontinued Medications   No medications on file    Allergies: No Known Allergies  Past Medical History: Past Medical History:  Diagnosis Date   Abnormal CT scan, chest 06/2011   multiple lung nodules; needs repeat study in 3 to 12 months.   Arthritis    Hands   Chronic anticoagulation    Common bile duct dilation    COPD (chronic obstructive pulmonary disease) (HCC)    Pt denies   DVT (deep venous thrombosis) (HCC)    Gallstone pancreatitis    GERD (gastroesophageal reflux disease)    Hepatitis C    History of hiatal hernia    HTN (hypertension)    Noncompliance     Pancreatitis    Pancreatitis 06/2020   Pulmonary embolus (HCC)    Tick bite 07/2011   Tobacco abuse     Social History: Social History   Socioeconomic History   Marital status: Divorced    Spouse name: Not on file   Number of children: 0   Years of education: Not on file   Highest education level: Not on file  Occupational History   Occupation: retired    Comment: Long distance truck driver  Tobacco Use   Smoking status: Every Day    Packs/day: 0.25    Years: 43.00    Total pack years: 10.75    Types: Cigarettes   Smokeless tobacco: Never   Tobacco comments:    1 ppd for 40 years.  Vaping Use   Vaping Use: Never used  Substance and Sexual Activity   Alcohol use: Yes    Comment: Occasional. 4-5 beers 12 oz./week.    Drug use: No   Sexual activity: Not Currently  Other Topics Concern   Not on file  Social History Narrative   Retired Administrator    Social Determinants of Radio broadcast assistant Strain: Not on file  Food Insecurity: Not on file  Transportation Needs: Not on file  Physical Activity: Not on file  Stress: Not on file  Social Connections: Not on file  Labs: Hepatitis C Lab Results  Component Value Date   HCVGENOTYPE 2 10/11/2021   HCVRNAPCRQN 11,300,000 (H) 10/11/2021   FIBROSTAGE F0-F1 10/11/2021   Hepatitis B Lab Results  Component Value Date   HEPBSAB NON-REACTIVE 10/11/2021   HEPBSAG NON-REACTIVE 10/11/2021   HEPBCAB NON-REACTIVE 10/11/2021   Hepatitis A Lab Results  Component Value Date   HAV NON-REACTIVE 10/11/2021   HIV Lab Results  Component Value Date   HIV NON-REACTIVE 10/11/2021   HIV Non Reactive 05/22/2020   Lab Results  Component Value Date   CREATININE 0.79 10/25/2021   CREATININE 0.77 10/11/2021   CREATININE 0.96 09/27/2021   CREATININE 0.89 05/25/2020   CREATININE 0.80 05/24/2020   Lab Results  Component Value Date   AST 30 10/25/2021   AST 31 10/11/2021   AST 35 09/27/2021   ALT 33 10/25/2021    ALT 21 10/11/2021   ALT 20 10/11/2021   INR 0.9 10/11/2021   INR 0.92 07/19/2011   INR 1.4 06/19/2011    Assessment: Justin Suarez presents to clinic today for HCV follow-up. He has been taking Mavyret for 4 weeks and is tolerating the medicine well. He has not missed any doses; he takes 3 tablets together every evening at dinnertime. States he has 3 tablets left in his first box; provided second box of [place] in clinic today. Will check HCV RNA today and follow-up in 4 weeks.  He is also non-immune to hepatitis A and hepatitis B; will start both series today. Deferred flu shot today.    Plan: Check HCV RNA Continue Mavyret x 8 weeks Administer Heplisav #1 (second in 1 month) Administer Havrix #1 (second in 6 months) Follow up on 10/24 at 3:15pm   Alfonse Spruce, PharmD, CPP, [place] Clinical Pharmacist Practitioner [facility] 11/15/2021, 11:28 AM

## 2021-11-17 LAB — HEPATITIS C RNA QUANTITATIVE
HCV Quantitative Log: <1.18 log IU/mL
HCV RNA, PCR, QN: <15 IU/mL

## 2021-11-30 ENCOUNTER — Ambulatory Visit (INDEPENDENT_AMBULATORY_CARE_PROVIDER_SITE_OTHER): Payer: Medicare Other | Admitting: Internal Medicine

## 2021-11-30 ENCOUNTER — Encounter: Payer: Self-pay | Admitting: Internal Medicine

## 2021-11-30 VITALS — BP 140/90 | HR 80 | Ht 73.0 in | Wt 166.0 lb

## 2021-11-30 DIAGNOSIS — Z1211 Encounter for screening for malignant neoplasm of colon: Secondary | ICD-10-CM | POA: Diagnosis not present

## 2021-11-30 DIAGNOSIS — R1013 Epigastric pain: Secondary | ICD-10-CM

## 2021-11-30 DIAGNOSIS — R1310 Dysphagia, unspecified: Secondary | ICD-10-CM

## 2021-11-30 DIAGNOSIS — K862 Cyst of pancreas: Secondary | ICD-10-CM

## 2021-11-30 DIAGNOSIS — K861 Other chronic pancreatitis: Secondary | ICD-10-CM | POA: Diagnosis not present

## 2021-11-30 MED ORDER — NA SULFATE-K SULFATE-MG SULF 17.5-3.13-1.6 GM/177ML PO SOLN: 1.0000 | Freq: Once | ORAL | 0 refills | Status: AC

## 2021-11-30 NOTE — Patient Instructions (Signed)
_______________________________________________________  If you are age 67 or older, your body mass index should be between 23-30. Your Body mass index is 21.9 kg/m. If this is out of the aforementioned range listed, please consider follow up with your Primary Care Provider.  If you are age 46 or younger, your body mass index should be between 19-25. Your Body mass index is 21.9 kg/m. If this is out of the aformentioned range listed, please consider follow up with your Primary Care Provider.   ________________________________________________________  The [facility] providers would like to encourage you to use [facility] to communicate with providers for non-urgent requests or questions.  Due to long hold times on the telephone, sending your provider a message by [facility] may be a faster and more efficient way to get a response.  Please allow 48 business hours for a response.  Please remember that this is for non-urgent requests.  _______________________________________________________  Justin Suarez have been scheduled for an endoscopy and colonoscopy. Please follow the written instructions given to you at your visit today. Please pick up your prep supplies at the pharmacy within the next 1-3 days. If you use inhalers (even only as needed), please bring them with you on the day of your procedure.  You will be contacted by [facility] in the next 2 days to arrange an MRI.  The number on your caller ID will be [phone], please answer when they call.  If you have not heard from them in 2 days please call [phone] to schedule.

## 2021-11-30 NOTE — Progress Notes (Signed)
HISTORY OF PRESENT ILLNESS:  Justin Suarez is a 66 y.o. male, retired Magazine features editor, with past medical history as listed below.  He presents today for follow-up regarding history of pancreatitis, pancreatic cyst, and new complaints of intermittent postprandial abdominal pain and dysphagia.  Patient was last seen in this office August 18, 2020.  The following is the impression and plan from that encounter.  ASSESSMENT:   1.  Alcoholic pancreatitis.  Clinically resolved. 2.  Pancreatic pseudocyst of the uncinate process.  Evaluated by EUS earlier this year.  Being followed. 3.  Recurrent right upper quadrant pain consistent with biliary colic.  Status post laparoscopic cholecystectomy with negative IOC April 2022.  Final pathology shows mild chronic cholecystitis 4.  Hepatitis C identified by his PCP.  Genotype 2B.  Quantitation 776,000 international units/mL.  No evidence for advanced liver disease.  Normal liver tests. 5.  Colon cancer screening.  Has not had 6.  Nonspecific change in bowel habits     PLAN:   1.  Stop drinking alcohol 2.  Stop smoking 3.  Routine office follow-up 3 months.  May consider repeat MRI at that time 4.  Would refer to ID for consideration of treatment of hepatitis C.  This will be arranged at the time of his follow-up visit 5.  Need to discuss colon cancer screening strategies 6.  Probiotic.   The patient did not follow-up as requested.  It looks like he had an MRI at [facility] November 19, 2020.  He was found to have extensive inflammatory stranding in the region of the pancreatic head consistent with pancreatitis.  Fluid collection noted.  Biliary dilation noted.  He continues to drink alcohol (admitting to 12 beers per week and occasional shots of liquor) and smoke.  He is hepatitis C is being treated by infectious diseases.  His last office visit there was November 15, 2021.  He is currently on [place] and tolerating it well.  He tells me  that approximately once per month, particularly after consuming a greasy meal, he will develop severe epigastric pain with radiation into the back.  This typically lasts for 1-1/2 to 2 days.  He tells me that his weight has been stable.  Bowel habits regular.  Next, he mentions intermittent solid food dysphagia items such as cheeseburgers.  Occasional heartburn.  He has not had prior colon cancer screening.  We discussed this today.  Blood work from October 25, 2021 shows unremarkable comprehensive metabolic panel except for alkaline phosphatase of 245.  His last CBC August 2023 revealed hemoglobin 13.0.  Platelet count 330,000.  REVIEW OF SYSTEMS:  All non-GI ROS negative unless otherwise stated in the HPI except for arthritis, back pain, night sweats  Past Medical History:  Diagnosis Date   Abnormal CT scan, chest 06/2011   multiple lung nodules; needs repeat study in 3 to 12 months.   Arthritis    Hands   Chronic anticoagulation    Common bile duct dilation    COPD (chronic obstructive pulmonary disease) (HCC)    Pt denies   DVT (deep venous thrombosis) (HCC)    Gallstone pancreatitis    GERD (gastroesophageal reflux disease)    Hepatitis C    History of hiatal hernia    HTN (hypertension)    Noncompliance    Pancreatitis    Pancreatitis 06/2020   Pulmonary embolus (HCC)    Tick bite 07/2011   Tobacco abuse     Past Surgical History:  Procedure  Laterality Date   ANKLE SURGERY Right 1998   3 surgeries.   CHOLECYSTECTOMY N/A 05/24/2020   Procedure: LAPAROSCOPIC CHOLECYSTECTOMY WITH INTRAOPERATIVE CHOLANGIOGRAM;  Surgeon: Greer Pickerel, MD;  Location: [facility];  Service: General;  Laterality: N/A;   ESOPHAGOGASTRODUODENOSCOPY (EGD) WITH PROPOFOL N/A 04/01/2020   Procedure: ESOPHAGOGASTRODUODENOSCOPY (EGD) WITH PROPOFOL;  Surgeon: Milus Banister, MD;  Location: [facility];  Service: Endoscopy;  Laterality: N/A;   EUS N/A 04/01/2020   Procedure: UPPER ENDOSCOPIC ULTRASOUND  (EUS) RADIAL;  Surgeon: Milus Banister, MD;  Location: [facility];  Service: Endoscopy;  Laterality: N/A;   FOOT SURGERY Right    x 3 from a fall   INGUINAL HERNIA REPAIR Right    years ago    Social History Justin Suarez  reports that he has been smoking cigarettes. He has a 10.75 pack-year smoking history. He has never used smokeless tobacco. He reports current alcohol use. He reports that he does not use drugs.  family history includes Atrial fibrillation in his brother; Cerebral aneurysm in his sister; Coronary artery disease in his sister and sister; Diabetes in his mother; Heart disease in his father, mother, and sister; Heart disease (age of onset: 68) in his sister; Heart failure in his mother; Hypertension in his brother; Kidney disease in his mother.  No Known Allergies     PHYSICAL EXAMINATION: Vital signs: BP (!) 140/90   Pulse 80   Ht '6\' 1"'$  (1.854 m)   Wt 166 lb (75.3 kg)   BMI 21.90 kg/m   Constitutional: generally well-appearing, no acute distress Psychiatric: alert and oriented x3, cooperative Eyes: extraocular movements intact, anicteric, conjunctiva pink Mouth: oral pharynx moist, no lesions Neck: supple no lymphadenopathy Cardiovascular: heart regular rate and rhythm, no murmur Lungs: clear to auscultation bilaterally Abdomen: soft, nontender, nondistended, no obvious ascites, no peritoneal signs, normal bowel sounds, no organomegaly Rectal: Omitted Extremities: no clubbing, cyanosis, or lower extremity edema bilaterally Skin: Weathered.  No lesions on visible extremities Neuro: No focal deficits. No asterixis.     ASSESSMENT:   1.  History of alcoholic pancreatitis.  I suspect that his intermittent problems with abdominal pain as described are related to lesser bouts of pancreatitis. 2.  Pancreatic pseudocyst of the uncinate process.  Evaluated by EUS with Dr. Ardis Hughs February 2022.  Being followed.  Last MRI September 2022 3.  Status post  laparoscopic cholecystectomy with negative IOC April 2022.  Final pathology shows mild chronic cholecystitis 4.  Hepatitis C identified by his PCP.  Genotype 2B.  Quantitation 776,000 international units/mL.  No evidence for advanced liver disease.  Normal liver tests previously.  Now being followed by the as well as being treated 5.  Colon cancer screening.  Has not had.  Motivated 6.  Intermittent solid food dysphagia.  Rule out peptic stricture 7.  Elevated alkaline phosphatase and biliary dilation on last MRI.  Rule out biliary obstruction secondary to chronic pancreatitis     PLAN:   1.  Stop drinking all alcohol 2.  Stop smoking 3.  Repeat MRI at this time to evaluate recurrent pain, space-occupying pancreatic lesion, and biliary dilation. 4.  Schedule upper endoscopy with possible esophageal dilation.The nature of the procedure, as well as the risks, benefits, and alternatives were carefully and thoroughly reviewed with the patient. Ample time for discussion and questions allowed. The patient understood, was satisfied, and agreed to proceed.  5.  Schedule screening colonoscopy.The nature of the procedure, as well as the risks, benefits, and alternatives  were carefully and thoroughly reviewed with the patient. Ample time for discussion and questions allowed. The patient understood, was satisfied, and agreed to proceed.  6.  Ongoing management of hepatitis C per ID. 7.  May need PPI A total time of 50 minutes was spent preparing to see the patient, reviewing x-rays and laboratories, reviewing outside consultation notes, obtaining comprehensive history, performing medically appropriate physical examination, counseling and educating the patient regarding the above listed issues, ordering therapeutic upper endoscopy, screening colonoscopy, and advanced imaging in the form of MRI.  Finally, documenting clinical information in the health record.

## 2021-12-01 ENCOUNTER — Other Ambulatory Visit (HOSPITAL_COMMUNITY): Payer: Self-pay

## 2021-12-02 ENCOUNTER — Other Ambulatory Visit (HOSPITAL_COMMUNITY): Payer: Self-pay

## 2021-12-13 ENCOUNTER — Other Ambulatory Visit: Payer: Self-pay

## 2021-12-13 ENCOUNTER — Ambulatory Visit (INDEPENDENT_AMBULATORY_CARE_PROVIDER_SITE_OTHER): Payer: Medicare Other | Admitting: Pharmacist

## 2021-12-13 DIAGNOSIS — B182 Chronic viral hepatitis C: Secondary | ICD-10-CM

## 2021-12-13 DIAGNOSIS — Z23 Encounter for immunization: Secondary | ICD-10-CM

## 2021-12-13 NOTE — Progress Notes (Signed)
12/13/2021  HPI: Justin Suarez is a 66 y.o. male who presents to the [facility] for Hepatitis C follow-up.  Medication: Mavyret x 8 weeks   Start Date: 10/20/21   Hepatitis C Genotype: 2   Fibrosis Score: F0-F1   Hepatitis C RNA: 11.3 million (10/11/21), <15 (11/15/21)  Patient Active Problem List   Diagnosis Date Noted   Hepatitis C test positive 10/02/2021   Encounter for screening for lung cancer 10/02/2021   Chronic viral hepatitis C ([place]) 10/02/2021   Tobacco use disorder 10/02/2021   Mixed hyperlipidemia 09/25/2021   Hyponatremia 05/22/2020   Essential hypertension 05/22/2020   Chest pain 08/07/2011   COPD (chronic obstructive pulmonary disease) ([place]) 04/13/2011   Pulmonary embolus ([place]) 03/15/2011   Tobacco abuse 03/15/2011   Long term (current) use of anticoagulants 03/15/2011   DVT, lower extremity ([place]) 03/15/2011    Patient's Medications  New Prescriptions   No medications on file  Previous Medications   AMLODIPINE (NORVASC) 5 MG TABLET    Take 2 tablets (10 mg total) by mouth daily.   GLECAPREVIR-PIBRENTASVIR (MAVYRET) 100-40 MG TABS    Take 3 tablets by mouth daily with breakfast.   HYDRALAZINE (APRESOLINE) 10 MG TABLET    TAKE 1 TABLET BY MOUTH THREE TIMES A DAY   ROSUVASTATIN (CRESTOR) 10 MG TABLET    Take 1 tablet (10 mg total) by mouth daily.  Modified Medications   No medications on file  Discontinued Medications   No medications on file    Allergies: No Known Allergies  Past Medical History: Past Medical History:  Diagnosis Date   Abnormal CT scan, chest 06/2011   multiple lung nodules; needs repeat study in 3 to 12 months.   Arthritis    Hands   Chronic anticoagulation    Common bile duct dilation    COPD (chronic obstructive pulmonary disease) (HCC)    Pt denies   DVT (deep venous thrombosis) (HCC)    Gallstone pancreatitis    GERD (gastroesophageal reflux disease)    Hepatitis C    History of hiatal hernia    HTN  (hypertension)    Noncompliance    Pancreatitis    Pancreatitis 06/2020   Pulmonary embolus (HCC)    Tick bite 07/2011   Tobacco abuse     Social History: Social History   Socioeconomic History   Marital status: Divorced    Spouse name: Not on file   Number of children: 0   Years of education: Not on file   Highest education level: Not on file  Occupational History   Occupation: retired    Comment: Long distance truck driver  Tobacco Use   Smoking status: Every Day    Packs/day: 0.25    Years: 43.00    Total pack years: 10.75    Types: Cigarettes   Smokeless tobacco: Never   Tobacco comments:    1 ppd for 40 years.  Vaping Use   Vaping Use: Never used  Substance and Sexual Activity   Alcohol use: Yes    Comment: Occasional. 4-5 beers 12 oz./week.    Drug use: No   Sexual activity: Not Currently  Other Topics Concern   Not on file  Social History Narrative   Retired Administrator    Social Determinants of Radio broadcast assistant Strain: Not on file  Food Insecurity: Not on file  Transportation Needs: Not on file  Physical Activity: Not on file  Stress: Not on file  Social Connections: Not on file    Labs: Hepatitis C Lab Results  Component Value Date   HCVGENOTYPE 2 10/11/2021   HCVRNAPCRQN <15 11/15/2021   HCVRNAPCRQN 11,300,000 (H) 10/11/2021   FIBROSTAGE F0-F1 10/11/2021   Hepatitis B Lab Results  Component Value Date   HEPBSAB NON-REACTIVE 10/11/2021   HEPBSAG NON-REACTIVE 10/11/2021   HEPBCAB NON-REACTIVE 10/11/2021   Hepatitis A Lab Results  Component Value Date   HAV NON-REACTIVE 10/11/2021   HIV Lab Results  Component Value Date   HIV NON-REACTIVE 10/11/2021   HIV Non Reactive 05/22/2020   Lab Results  Component Value Date   CREATININE 0.79 10/25/2021   CREATININE 0.77 10/11/2021   CREATININE 0.96 09/27/2021   CREATININE 0.89 05/25/2020   CREATININE 0.80 05/24/2020   Lab Results  Component Value Date   AST 30  10/25/2021   AST 31 10/11/2021   AST 35 09/27/2021   ALT 33 10/25/2021   ALT 21 10/11/2021   ALT 20 10/11/2021   INR 0.9 10/11/2021   INR 0.92 07/19/2011   INR 1.4 06/19/2011    Assessment: Justin Suarez presents today for HCV end of treatment follow-up. He has been taking Mavyret correctly, 3 tablets together once daily at dinnertime. He has not missed any doses of Mavyret. He has been tolerating Mavyret without issues. However, he also shares that he did notice about a week ago there were "strange bumps" on his neck that have been itchy. He has a scab on the back of his neck. He also shares that he was "attacked by barbed wire" which was old and rusty in nature. He agrees to his second vaccination for Heplisav-B as well as a Tdap vaccine today. He politely declines flu and updated COVID-19 vaccines.    Plan: - Check HCV RNA - Administered second Heplisav-B  - Administered Tdap vaccine - Scheduled 47-monthSVR follow-up with Dr. WJuleen Chinaon 03/16/2022   CEliseo Gum PharmD PGY1 Pharmacy Resident   12/13/2021  3:13 PM

## 2021-12-15 LAB — HEPATITIS C RNA QUANTITATIVE
HCV Quantitative Log: <1.18 log IU/mL
HCV RNA, PCR, QN: <15 IU/mL

## 2021-12-21 ENCOUNTER — Encounter: Payer: Self-pay | Admitting: Internal Medicine

## 2021-12-21 ENCOUNTER — Ambulatory Visit (AMBULATORY_SURGERY_CENTER): Payer: Medicare Other | Admitting: Internal Medicine

## 2021-12-21 VITALS — BP 133/75 | HR 55 | Temp 97.6°F | Resp 16 | Ht 73.0 in | Wt 166.0 lb

## 2021-12-21 DIAGNOSIS — D120 Benign neoplasm of cecum: Secondary | ICD-10-CM

## 2021-12-21 DIAGNOSIS — K2950 Unspecified chronic gastritis without bleeding: Secondary | ICD-10-CM | POA: Diagnosis not present

## 2021-12-21 DIAGNOSIS — Z1211 Encounter for screening for malignant neoplasm of colon: Secondary | ICD-10-CM

## 2021-12-21 DIAGNOSIS — R1310 Dysphagia, unspecified: Secondary | ICD-10-CM

## 2021-12-21 DIAGNOSIS — K861 Other chronic pancreatitis: Secondary | ICD-10-CM

## 2021-12-21 DIAGNOSIS — K2100 Gastro-esophageal reflux disease with esophagitis, without bleeding: Secondary | ICD-10-CM

## 2021-12-21 DIAGNOSIS — D128 Benign neoplasm of rectum: Secondary | ICD-10-CM

## 2021-12-21 DIAGNOSIS — K222 Esophageal obstruction: Secondary | ICD-10-CM | POA: Diagnosis not present

## 2021-12-21 MED ORDER — SODIUM CHLORIDE 0.9 % IV SOLN: 500.0000 mL | Freq: Once | INTRAVENOUS | Status: DC

## 2021-12-21 MED ORDER — PANTOPRAZOLE SODIUM 40 MG PO TBEC: 40.0000 mg | DELAYED_RELEASE_TABLET | Freq: Every day | ORAL | 11 refills | Status: DC

## 2021-12-21 NOTE — Patient Instructions (Addendum)
-Handout on polyps and hemorrhoids. - Repeat colonoscopy in 3 years for surveillance. - Patient has a contact number available for emergencies. The signs and symptoms of potential delayed complications were discussed with the patient. Return to normal activities tomorrow. Written discharge instructions were provided to the patient. - Resume previous diet. - Continue present medications. Start taking  pantoprazole 40 mg daily - Await pathology results.- Keep plans for your MRI as scheduled - Office follow-up with Dr. Henrene Pastor in 4 to 6 weeks  YOU HAD AN ENDOSCOPIC PROCEDURE TODAY AT [facility]:   Refer to the procedure report that was given to you for any specific questions about what was found during the examination.  If the procedure report does not answer your questions, please call your gastroenterologist to clarify.  If you requested that your care partner not be given the details of your procedure findings, then the procedure report has been included in a sealed envelope for you to review at your convenience later.  YOU SHOULD EXPECT: Some feelings of bloating in the abdomen. Passage of more gas than usual.  Walking can help get rid of the air that was put into your GI tract during the procedure and reduce the bloating. If you had a lower endoscopy (such as a colonoscopy or flexible sigmoidoscopy) you may notice spotting of blood in your stool or on the toilet paper. If you underwent a bowel prep for your procedure, you may not have a normal bowel movement for a few days.  Please Note:  You might notice some irritation and congestion in your nose or some drainage.  This is from the oxygen used during your procedure.  There is no need for concern and it should clear up in a day or so.  SYMPTOMS TO REPORT IMMEDIATELY:  Following lower endoscopy (colonoscopy or flexible sigmoidoscopy):  Excessive amounts of blood in the stool  Significant tenderness or worsening of abdominal  pains  Swelling of the abdomen that is new, acute  Fever of 100F or higher  Following upper endoscopy (EGD)  Vomiting of blood or coffee ground material  New chest pain or pain under the shoulder blades  Painful or persistently difficult swallowing  New shortness of breath  Fever of 100F or higher  Black, tarry-looking stools  For urgent or emergent issues, a gastroenterologist can be reached at any hour by calling [phone]. Do not use MyChart messaging for urgent concerns.    DIET:  We do recommend a small meal at first, but then you may proceed to your regular diet.  Drink plenty of fluids but you should avoid alcoholic beverages for 24 hours.  ACTIVITY:  You should plan to take it easy for the rest of today and you should NOT DRIVE or use heavy machinery until tomorrow (because of the sedation medicines used during the test).    FOLLOW UP: Our staff will call the number listed on your records the next business day following your procedure.  We will call around 7:15- 8:00 am to check on you and address any questions or concerns that you may have regarding the information given to you following your procedure. If we do not reach you, we will leave a message.     If any biopsies were taken you will be contacted by phone or by letter within the next 1-3 weeks.  Please call us at [phone] if you have not heard about the biopsies in 3 weeks.    SIGNATURES/CONFIDENTIALITY: You  and/or your care partner have signed paperwork which will be entered into your electronic medical record.  These signatures attest to the fact that that the information above on your After Visit Summary has been reviewed and is understood.  Full responsibility of the confidentiality of this discharge information lies with you and/or your care-partner.

## 2021-12-21 NOTE — Progress Notes (Signed)
Sedate, gd SR, tolerated procedure well, VSS, report to RN 

## 2021-12-21 NOTE — Progress Notes (Signed)
Called to room to assist during endoscopic procedure.  Patient ID and intended procedure confirmed with present staff. Received instructions for my participation in the procedure from the performing physician.  

## 2021-12-21 NOTE — Progress Notes (Signed)
HISTORY OF PRESENT ILLNESS:   Justin Suarez is a 66 y.o. male, retired Magazine features editor, with past medical history as listed below.  He presents today for follow-up regarding history of pancreatitis, pancreatic cyst, and new complaints of intermittent postprandial abdominal pain and dysphagia.  Patient was last seen in this office August 18, 2020.  The following is the impression and plan from that encounter.   ASSESSMENT:   1.  Alcoholic pancreatitis.  Clinically resolved. 2.  Pancreatic pseudocyst of the uncinate process.  Evaluated by EUS earlier this year.  Being followed. 3.  Recurrent right upper quadrant pain consistent with biliary colic.  Status post laparoscopic cholecystectomy with negative IOC April 2022.  Final pathology shows mild chronic cholecystitis 4.  Hepatitis C identified by his PCP.  Genotype 2B.  Quantitation 776,000 international units/mL.  No evidence for advanced liver disease.  Normal liver tests. 5.  Colon cancer screening.  Has not had 6.  Nonspecific change in bowel habits     PLAN:   1.  Stop drinking alcohol 2.  Stop smoking 3.  Routine office follow-up 3 months.  May consider repeat MRI at that time 4.  Would refer to ID for consideration of treatment of hepatitis C.  This will be arranged at the time of his follow-up visit 5.  Need to discuss colon cancer screening strategies 6.  Probiotic.     The patient did not follow-up as requested.  It looks like he had an MRI at [facility] November 19, 2020.  He was found to have extensive inflammatory stranding in the region of the pancreatic head consistent with pancreatitis.  Fluid collection noted.  Biliary dilation noted.  He continues to drink alcohol (admitting to 12 beers per week and occasional shots of liquor) and smoke.  He is hepatitis C is being treated by infectious diseases.  His last office visit there was November 15, 2021.  He is currently on [place] and tolerating it well.   He  tells me that approximately once per month, particularly after consuming a greasy meal, he will develop severe epigastric pain with radiation into the back.  This typically lasts for 1-1/2 to 2 days.  He tells me that his weight has been stable.  Bowel habits regular.  Next, he mentions intermittent solid food dysphagia items such as cheeseburgers.  Occasional heartburn.  He has not had prior colon cancer screening.  We discussed this today.  Blood work from October 25, 2021 shows unremarkable comprehensive metabolic panel except for alkaline phosphatase of 245.  His last CBC August 2023 revealed hemoglobin 13.0.  Platelet count 330,000.   REVIEW OF SYSTEMS:   All non-GI ROS negative unless otherwise stated in the HPI except for arthritis, back pain, night sweats       Past Medical History:  Diagnosis Date   Abnormal CT scan, chest 06/2011    multiple lung nodules; needs repeat study in 3 to 12 months.   Arthritis      Hands   Chronic anticoagulation     Common bile duct dilation     COPD (chronic obstructive pulmonary disease) (HCC)      Pt denies   DVT (deep venous thrombosis) (HCC)     Gallstone pancreatitis     GERD (gastroesophageal reflux disease)     Hepatitis C     History of hiatal hernia     HTN (hypertension)     Noncompliance     Pancreatitis  Pancreatitis 06/2020   Pulmonary embolus ([place])     Tick bite 07/2011   Tobacco abuse             Past Surgical History:  Procedure Laterality Date   ANKLE SURGERY Right 1998    3 surgeries.   CHOLECYSTECTOMY N/A 05/24/2020    Procedure: LAPAROSCOPIC CHOLECYSTECTOMY WITH INTRAOPERATIVE CHOLANGIOGRAM;  Surgeon: Justin Pickerel, MD;  Location: [facility];  Service: General;  Laterality: N/A;   ESOPHAGOGASTRODUODENOSCOPY (EGD) WITH PROPOFOL N/A 04/01/2020    Procedure: ESOPHAGOGASTRODUODENOSCOPY (EGD) WITH PROPOFOL;  Surgeon: Justin Banister, MD;  Location: [facility];  Service: Endoscopy;  Laterality: N/A;   EUS N/A 04/01/2020     Procedure: UPPER ENDOSCOPIC ULTRASOUND (EUS) RADIAL;  Surgeon: Justin Banister, MD;  Location: [facility];  Service: Endoscopy;  Laterality: N/A;   FOOT SURGERY Right      x 3 from a fall   INGUINAL HERNIA REPAIR Right      years ago      Social History Justin Suarez  reports that he has been smoking cigarettes. He has a 10.75 pack-year smoking history. He has never used smokeless tobacco. He reports current alcohol use. He reports that he does not use drugs.   family history includes Atrial fibrillation in his brother; Cerebral aneurysm in his sister; Coronary artery disease in his sister and sister; Diabetes in his mother; Heart disease in his father, mother, and sister; Heart disease (age of onset: 71) in his sister; Heart failure in his mother; Hypertension in his brother; Kidney disease in his mother.   No Known Allergies       PHYSICAL EXAMINATION: Vital signs: BP (!) 140/90   Pulse 80   Ht '6\' 1"'$  (1.854 m)   Wt 166 lb (75.3 kg)   BMI 21.90 kg/m   Constitutional: generally well-appearing, no acute distress Psychiatric: alert and oriented x3, cooperative Eyes: extraocular movements intact, anicteric, conjunctiva pink Mouth: oral pharynx moist, no lesions Neck: supple no lymphadenopathy Cardiovascular: heart regular rate and rhythm, no murmur Lungs: clear to auscultation bilaterally Abdomen: soft, nontender, nondistended, no obvious ascites, no peritoneal signs, normal bowel sounds, no organomegaly Rectal: Omitted Extremities: no clubbing, cyanosis, or lower extremity edema bilaterally Skin: Weathered.  No lesions on visible extremities Neuro: No focal deficits. No asterixis.        ASSESSMENT:   1.  History of alcoholic pancreatitis.  I suspect that his intermittent problems with abdominal pain as described are related to lesser bouts of pancreatitis. 2.  Pancreatic pseudocyst of the uncinate process.  Evaluated by EUS with Justin Suarez February 2022.  Being  followed.  Last MRI September 2022 3.  Status post laparoscopic cholecystectomy with negative IOC April 2022.  Final pathology shows mild chronic cholecystitis 4.  Hepatitis C identified by his PCP.  Genotype 2B.  Quantitation 776,000 international units/mL.  No evidence for advanced liver disease.  Normal liver tests previously.  Now being followed by the as well as being treated 5.  Colon cancer screening.  Has not had.  Motivated 6.  Intermittent solid food dysphagia.  Rule out peptic stricture 7.  Elevated alkaline phosphatase and biliary dilation on last MRI.  Rule out biliary obstruction secondary to chronic pancreatitis     PLAN:   1.  Stop drinking all alcohol 2.  Stop smoking 3.  Repeat MRI at this time to evaluate recurrent pain, space-occupying pancreatic lesion, and biliary dilation. 4.  Schedule upper endoscopy with possible esophageal dilation.The nature of  the procedure, as well as the risks, benefits, and alternatives were carefully and thoroughly reviewed with the patient. Ample time for discussion and questions allowed. The patient understood, was satisfied, and agreed to proceed.  5.  Schedule screening colonoscopy.The nature of the procedure, as well as the risks, benefits, and alternatives were carefully and thoroughly reviewed with the patient. Ample time for discussion and questions allowed. The patient understood, was satisfied, and agreed to proceed.  6.  Ongoing management of hepatitis C per ID. 7.  May need PPI

## 2021-12-21 NOTE — Op Note (Signed)
[facility] Patient Name: Justin Suarez Procedure Date: 12/21/2021 1:28 PM MRN: [id number] Endoscopist: Docia Chuck. Henrene Pastor , MD, [id number] Age: 66 Referring MD:  Date of Birth: Jun 28, 1955 Gender: Male Account #: [id number] Procedure:                Upper GI endoscopy with biopsies; Maloney dilation.                            19 [place] Indications:              Dysphagia Medicines:                Monitored Anesthesia Care Procedure:                Pre-Anesthesia Assessment:                           - Prior to the procedure, a History and Physical                            was performed, and patient medications and                            allergies were reviewed. The patient's tolerance of                            previous anesthesia was also reviewed. The risks                            and benefits of the procedure and the sedation                            options and risks were discussed with the patient.                            All questions were answered, and informed consent                            was obtained. Prior Anticoagulants: The patient has                            taken no anticoagulant or antiplatelet agents. ASA                            Grade Assessment: II - A patient with mild systemic                            disease. After reviewing the risks and benefits,                            the patient was deemed in satisfactory condition to                            undergo the procedure.  After obtaining informed consent, the endoscope was                            passed under direct vision. Throughout the                            procedure, the patient's blood pressure, pulse, and                            oxygen saturations were monitored continuously. The                            GIF [id number] #[id number] was introduced through the                            mouth, and advanced to the second part of duodenum.                             The upper GI endoscopy was accomplished without                            difficulty. The patient tolerated the procedure                            well. Scope In: Scope Out: Findings:                 The exam of the esophagus revealed mild                            inflammation and subtle inflammatory appearing                            nodularity at the gastroesophageal junction. This                            was biopsied.                           One benign-appearing, intrinsic moderate stenosis                            was found 40 cm from the incisors. This stenosis                            measured 1.5 cm (inner diameter). The scope was                            withdrawn. Dilation was performed with a Maloney                            dilator with no resistance at 28 Fr. The dilation                            site was examined following endoscope reinsertion  and showed no significant mucosal disruption.                           The stomach was normal, save small hiatal hernia.                           The examined duodenum was normal.                           The cardia and gastric fundus were normal on                            retroflexion. Complications:            No immediate complications. Estimated Blood Loss:     Estimated blood loss: none. Impression:               - Benign-appearing esophageal stenosis. Dilated.                            Inflammatory changes at the gastroesophageal                            junction. Biopsied                           - Normal stomach.                           - Normal examined duodenum.                           - No specimens collected. Recommendation:           - Patient has a contact number available for                            emergencies. The signs and symptoms of potential                            delayed complications were discussed with the                             patient. Return to normal activities tomorrow.                            Written discharge instructions were provided to the                            patient.                           - Resume previous diet.                           - Continue present medications.                           - Await pathology  results.                           - PRESCRIBE pantoprazole 40 mg daily; #30; 11                            refills                           - Keep plans for your MRI as scheduled                           - Office follow-up with Dr. Henrene Pastor in 4 to 6 weeks Docia Chuck. Henrene Pastor, MD 12/21/2021 2:35:57 PM This report has been signed electronically.

## 2021-12-21 NOTE — Progress Notes (Signed)
Pt's states no medical or surgical changes since previsit or office visit. 

## 2021-12-21 NOTE — Op Note (Signed)
[facility] Patient Name: Justin Suarez Procedure Date: 12/21/2021 1:28 PM MRN: [id number] Endoscopist: Docia Chuck. Henrene Pastor , MD, [id number] Age: 66 Referring MD:  Date of Birth: December 10, 1955 Gender: Male Account #: [id number] Procedure:                Colonoscopy with cold snare polypectomy x 4 Indications:              Screening for colorectal malignant neoplasm Medicines:                Monitored Anesthesia Care Procedure:                Pre-Anesthesia Assessment:                           - Prior to the procedure, a History and Physical                            was performed, and patient medications and                            allergies were reviewed. The patient's tolerance of                            previous anesthesia was also reviewed. The risks                            and benefits of the procedure and the sedation                            options and risks were discussed with the patient.                            All questions were answered, and informed consent                            was obtained. Prior Anticoagulants: The patient has                            taken no anticoagulant or antiplatelet agents. ASA                            Grade Assessment: II - A patient with mild systemic                            disease. After reviewing the risks and benefits,                            the patient was deemed in satisfactory condition to                            undergo the procedure.                           After obtaining informed consent, the colonoscope  was passed under direct vision. Throughout the                            procedure, the patient's blood pressure, pulse, and                            oxygen saturations were monitored continuously. The                            Olympus CF-HQ190L ([id number]) Colonoscope was                            introduced through the anus and advanced to the the                             cecum, identified by appendiceal orifice and                            ileocecal valve. The ileocecal valve, appendiceal                            orifice, and rectum were photographed. The quality                            of the bowel preparation was excellent. The                            colonoscopy was performed without difficulty. The                            patient tolerated the procedure well. The bowel                            preparation used was SUPREP via split dose                            instruction. Scope In: 1:52:45 PM Scope Out: 2:11:17 PM Scope Withdrawal Time: 0 hours 13 minutes 53 seconds  Total Procedure Duration: 0 hours 18 minutes 32 seconds  Findings:                 Four polyps were found in the rectum and cecum. The                            polyps were 3 to 5 mm in size. These polyps were                            removed with a cold snare. Resection and retrieval                            were complete.                           Internal hemorrhoids were found during  retroflexion. The hemorrhoids were moderate.                           The exam was otherwise without abnormality on                            direct and retroflexion views. Complications:            No immediate complications. Estimated blood loss:                            None. Estimated Blood Loss:     Estimated blood loss: none. Impression:               - Four 3 to 5 mm polyps in the rectum and in the                            cecum, removed with a cold snare. Resected and                            retrieved.                           - Internal hemorrhoids.                           - The examination was otherwise normal on direct                            and retroflexion views. Recommendation:           - Repeat colonoscopy in 3 years for surveillance.                           - Patient has a contact number available for                             emergencies. The signs and symptoms of potential                            delayed complications were discussed with the                            patient. Return to normal activities tomorrow.                            Written discharge instructions were provided to the                            patient.                           - Resume previous diet.                           - Continue present medications.                           -  Await pathology results. Docia Chuck. Henrene Pastor, MD 12/21/2021 2:15:50 PM This report has been signed electronically.

## 2021-12-22 ENCOUNTER — Telehealth: Payer: Self-pay

## 2021-12-22 NOTE — Telephone Encounter (Signed)
  Follow up Call-     12/21/2021   12:49 PM  Call back number  Post procedure Call Back phone  # [phone]  Permission to leave phone message Yes     Patient questions:  Do you have a fever, pain , or abdominal swelling? No. Pain Score  0 *  Have you tolerated food without any problems? Yes.    Have you been able to return to your normal activities? Yes.    Do you have any questions about your discharge instructions: Diet   No. Medications  No. Follow up visit  No.  Do you have questions or concerns about your Care? No.  Actions: * If pain score is 4 or above: No action needed, pain <4.

## 2021-12-27 ENCOUNTER — Encounter: Payer: Self-pay | Admitting: Internal Medicine

## 2022-01-02 ENCOUNTER — Ambulatory Visit (HOSPITAL_COMMUNITY)
Admission: RE | Admit: 2022-01-02 | Discharge: 2022-01-02 | Disposition: A | Discharge status: Home / Self Care | Payer: Medicare Other | Source: Ambulatory Visit | Attending: Internal Medicine | Admitting: Internal Medicine

## 2022-01-02 ENCOUNTER — Other Ambulatory Visit: Payer: Self-pay | Admitting: Internal Medicine

## 2022-01-02 ENCOUNTER — Other Ambulatory Visit (HOSPITAL_COMMUNITY): Payer: Self-pay | Admitting: Internal Medicine

## 2022-01-02 DIAGNOSIS — K861 Other chronic pancreatitis: Secondary | ICD-10-CM | POA: Insufficient documentation

## 2022-01-02 DIAGNOSIS — T1590XA Foreign body on external eye, part unspecified, unspecified eye, initial encounter: Secondary | ICD-10-CM | POA: Diagnosis not present

## 2022-01-02 DIAGNOSIS — R1013 Epigastric pain: Secondary | ICD-10-CM | POA: Insufficient documentation

## 2022-01-02 DIAGNOSIS — Z1211 Encounter for screening for malignant neoplasm of colon: Secondary | ICD-10-CM | POA: Diagnosis not present

## 2022-01-02 DIAGNOSIS — Z01818 Encounter for other preprocedural examination: Secondary | ICD-10-CM | POA: Diagnosis not present

## 2022-01-02 DIAGNOSIS — K862 Cyst of pancreas: Secondary | ICD-10-CM

## 2022-01-02 DIAGNOSIS — R1310 Dysphagia, unspecified: Secondary | ICD-10-CM

## 2022-01-02 DIAGNOSIS — R935 Abnormal findings on diagnostic imaging of other abdominal regions, including retroperitoneum: Secondary | ICD-10-CM | POA: Diagnosis not present

## 2022-01-02 DIAGNOSIS — K859 Acute pancreatitis without necrosis or infection, unspecified: Secondary | ICD-10-CM | POA: Diagnosis not present

## 2022-01-02 DIAGNOSIS — D7389 Other diseases of spleen: Secondary | ICD-10-CM | POA: Diagnosis not present

## 2022-01-02 DIAGNOSIS — K838 Other specified diseases of biliary tract: Secondary | ICD-10-CM | POA: Diagnosis not present

## 2022-01-02 MED ORDER — GADOBUTROL 1 MMOL/ML IV SOLN
7.5000 mL | Freq: Once | INTRAVENOUS | Status: AC | PRN
  Administered 2022-01-02: 7.5 mL via INTRAVENOUS

## 2022-01-03 ENCOUNTER — Telehealth: Payer: Self-pay | Admitting: Internal Medicine

## 2022-01-03 ENCOUNTER — Other Ambulatory Visit: Payer: Self-pay

## 2022-01-03 DIAGNOSIS — K861 Other chronic pancreatitis: Secondary | ICD-10-CM

## 2022-01-03 DIAGNOSIS — K862 Cyst of pancreas: Secondary | ICD-10-CM

## 2022-01-03 NOTE — Telephone Encounter (Signed)
Radiology dept called with high alert on MRI results.

## 2022-01-03 NOTE — Telephone Encounter (Signed)
Call report from radiology:  IMPRESSION: 1. Complex fluid collection with internal debris in the head of the pancreas, similar to prior examinations, once again favored to represent a chronic pancreatic pseudocyst with internal calcifications. Inflammatory changes are noted adjacent to this in the pancreas on today's study, suggesting recurrent acute pancreatitis. 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 There are numerous adjacent enlarged lymph nodes, and there are new hypovascular splenic lesions. Findings could suggest developing malignancy in the pancreas with nodal and splenic metastasis. 2. Abrupt narrowing of the distal common bile duct again noted, potentially indicative of a distal common bile duct stricture or extrinsic compression from the pancreatic head lesion. This is associated with mild common bile duct dilatation proximally (9 mm), but no intrahepatic biliary ductal dilatation at this time. 3. Subtle hyperenhancement in the head of the right twelfth rib and in the right transverse process of L1, poorly evaluated, but potentially healing fractures. Correlation with point tenderness and history of trauma is recommended. Metastatic lesions are not excluded, but the close proximity to one another suggests a traumatic etiology. 4. Additional incidental findings, as above.   These results will be called to the ordering clinician or representative by the Radiologist Assistant, and communication documented in the PACS or [employer].     Electronically Signed   By: Vinnie Langton M.D.   On: 01/03/2022 10:25

## 2022-01-03 NOTE — Telephone Encounter (Signed)
Thanks.  I will address the findings on the results note.

## 2022-01-03 NOTE — Telephone Encounter (Signed)
Noted  

## 2022-01-06 ENCOUNTER — Ambulatory Visit (INDEPENDENT_AMBULATORY_CARE_PROVIDER_SITE_OTHER): Payer: Medicare Other | Admitting: Nurse Practitioner

## 2022-01-06 ENCOUNTER — Encounter: Payer: Self-pay | Admitting: Nurse Practitioner

## 2022-01-06 VITALS — BP 136/68 | HR 83 | Temp 97.2°F | Wt 172.0 lb

## 2022-01-06 DIAGNOSIS — R21 Rash and other nonspecific skin eruption: Secondary | ICD-10-CM | POA: Diagnosis not present

## 2022-01-06 MED ORDER — TRIAMCINOLONE ACETONIDE 40 MG/ML IJ SUSP
60.0000 mg | Freq: Once | INTRAMUSCULAR | Status: AC
  Administered 2022-01-06: 60 mg via INTRAMUSCULAR

## 2022-01-06 MED ORDER — TRIAMCINOLONE ACETONIDE 0.025 % EX CREA: 1.0000 | TOPICAL_CREAM | Freq: Two times a day (BID) | CUTANEOUS | 0 refills | Status: DC

## 2022-01-06 MED ORDER — DOXYCYCLINE HYCLATE 100 MG PO TABS: 100.0000 mg | ORAL_TABLET | Freq: Two times a day (BID) | ORAL | 0 refills | Status: AC

## 2022-01-06 MED ORDER — HYDROXYZINE PAMOATE 25 MG PO CAPS: 25.0000 mg | ORAL_CAPSULE | Freq: Three times a day (TID) | ORAL | 1 refills | Status: DC | PRN

## 2022-01-06 MED ORDER — PREDNISONE 20 MG PO TABS: ORAL_TABLET | ORAL | 0 refills | Status: AC

## 2022-01-06 NOTE — Patient Instructions (Addendum)
Kenalog 60 mg injection given in office Take Prednisone pack as prescribed Take Doxycycline twice daily for 14 days Take Hydroxyzine 25 mg as needed for itching We will call you with labs Consider Tecnu over the counter prior to doing yard work to prevent future rash Apply Triamcinolone cream to rash daily Follow-up in 1 week    Rash, Adult  A rash is a change in the color of your skin. A rash can also change the way your skin feels. There are many different conditions and factors that can cause a rash. Follow these instructions at home: The goal of treatment is to stop the itching and keep the rash from spreading. Watch for any changes in your symptoms. Let your doctor know about them. Follow these instructions to help with your condition: Medicine Take or apply over-the-counter and prescription medicines only as told by your doctor. These may include medicines: To treat red or swollen skin (corticosteroid creams). To treat itching. To treat an allergy (oral antihistamines). To treat very bad symptoms (oral corticosteroids).  Skin care Put cool cloths (compresses) on the affected areas. Do not scratch or rub your skin. Avoid covering the rash. Make sure that the rash is exposed to air as much as possible. Managing itching and discomfort Avoid hot showers or baths. These can make itching worse. A cold shower may help. Try taking a bath with: Epsom salts. You can get these at your local pharmacy or grocery store. Follow the instructions on the package. Baking soda. Pour a small amount into the bath as told by your doctor. Colloidal oatmeal. You can get this at your local pharmacy or grocery store. Follow the instructions on the package. Try putting baking soda paste onto your skin. Stir water into baking soda until it gets like a paste. Try putting on a lotion that relieves itchiness (calamine lotion). Keep cool and out of the sun. Sweating and being hot can make itching  worse. General instructions  Rest as needed. Drink enough fluid to keep your pee (urine) pale yellow. Wear loose-fitting clothing. Avoid scented soaps, detergents, and perfumes. Use gentle soaps, detergents, perfumes, and other cosmetic products. Avoid anything that causes your rash. Keep a journal to help track what causes your rash. Write down: What you eat. What cosmetic products you use. What you drink. What you wear. This includes jewelry. Keep all follow-up visits as told by your doctor. This is important. Contact a doctor if: You sweat at night. You lose weight. You pee (urinate) more than normal. You pee less than normal, or you notice that your pee is a darker color than normal. You feel weak. You throw up (vomit). Your skin or the whites of your eyes look yellow (jaundice). Your skin: Tingles. Is numb. Your rash: Does not go away after a few days. Gets worse. You are: More thirsty than normal. More tired than normal. You have: New symptoms. Pain in your belly (abdomen). A fever. Watery poop (diarrhea). Get help right away if: You have a fever and your symptoms suddenly get worse. You start to feel mixed up (confused). You have a very bad headache or a stiff neck. You have very bad joint pains or stiffness. You have jerky movements that you cannot control (seizure). Your rash covers all or most of your body. The rash may or may not be painful. You have blisters that: Are on top of the rash. Grow larger. Grow together. Are painful. Are inside your nose or mouth. You have a  rash that: Looks like purple pinprick-sized spots all over your body. Has a "bull's eye" or looks like a target. Is red and painful, causes your skin to peel, and is not from being in the sun too long. Summary A rash is a change in the color of your skin. A rash can also change the way your skin feels. The goal of treatment is to stop the itching and keep the rash from spreading. Take  or apply over-the-counter and prescription medicines only as told by your doctor. Contact a doctor if you have new symptoms or symptoms that get worse. Keep all follow-up visits as told by your doctor. This is important. This information is not intended to replace advice given to you by your health care provider. Make sure you discuss any questions you have with your health care provider. Document Revised: 08/09/2021 Document Reviewed: 11/18/2020 Elsevier Patient Education  [employer].

## 2022-01-06 NOTE — Progress Notes (Signed)
Acute Office Visit  Subjective:    Patient ID: Justin Suarez, male    DOB: [birth year], 66 y.o.   MRN: [id number]  Chief Complaint  Patient presents with   Rash    HPI: Patient is in today for pruritic rash to torso, bilateral upper and lower extremities. Onset of symptoms was four days ago. Treatment has included Benadryl and Calamine lotion. States he was cutting trees with a chain saw a day prior to rash eruption. Denies known tick bite, fever, chills, headache, and or body aches. He denies new medications, changes to soaps, lotions, or laundry detergents.  Past Medical History:  Diagnosis Date   Abnormal CT scan, chest 06/2011   multiple lung nodules; needs repeat study in 3 to 12 months.   Arthritis    Hands   Chronic anticoagulation    Common bile duct dilation    COPD (chronic obstructive pulmonary disease) (HCC)    Pt denies   DVT (deep venous thrombosis) (HCC)    Gallstone pancreatitis    GERD (gastroesophageal reflux disease)    Hepatitis C    History of hiatal hernia    HTN (hypertension)    Noncompliance    Pancreatitis    Pancreatitis 06/2020   Pulmonary embolus (HCC)    Tick bite 07/2011   Tobacco abuse     Past Surgical History:  Procedure Laterality Date   ANKLE SURGERY Right 1998   3 surgeries.   CHOLECYSTECTOMY N/A 05/24/2020   Procedure: LAPAROSCOPIC CHOLECYSTECTOMY WITH INTRAOPERATIVE CHOLANGIOGRAM;  Surgeon: Greer Pickerel, MD;  Location: [facility];  Service: General;  Laterality: N/A;   ESOPHAGOGASTRODUODENOSCOPY (EGD) WITH PROPOFOL N/A 04/01/2020   Procedure: ESOPHAGOGASTRODUODENOSCOPY (EGD) WITH PROPOFOL;  Surgeon: Milus Banister, MD;  Location: [facility];  Service: Endoscopy;  Laterality: N/A;   EUS N/A 04/01/2020   Procedure: UPPER ENDOSCOPIC ULTRASOUND (EUS) RADIAL;  Surgeon: Milus Banister, MD;  Location: [facility];  Service: Endoscopy;  Laterality: N/A;   FINGER AMPUTATION Left 1970   first part of middle finger removed   FOOT  SURGERY Right    x 3 from a fall   INGUINAL HERNIA REPAIR Right    years ago    Family History  Problem Relation Age of Onset   Kidney disease Mother        ESRD dialysis   Diabetes Mother    Heart disease Mother        CHF   Heart failure Mother    Heart disease Father    Heart disease Sister 13       CABG   Coronary artery disease Sister    Coronary artery disease Sister    Heart disease Sister    Cerebral aneurysm Sister    Atrial fibrillation Brother    Hypertension Brother    Esophageal cancer Neg Hx    Colon cancer Neg Hx    Rectal cancer Neg Hx    Stomach cancer Neg Hx     Social History   Socioeconomic History   Marital status: Divorced    Spouse name: Not on file   Number of children: 0   Years of education: Not on file   Highest education level: Not on file  Occupational History   Occupation: retired    Comment: Long distance truck driver  Tobacco Use   Smoking status: Every Day    Packs/day: 0.25    Years: 43.00    Total pack years: 10.75    Types: Cigarettes  Smokeless tobacco: Never   Tobacco comments:    1 ppd for 40 years.  Vaping Use   Vaping Use: Never used  Substance and Sexual Activity   Alcohol use: Yes    Comment: Occasional. 4-5 beers 12 oz./week.    Drug use: No   Sexual activity: Not Currently  Other Topics Concern   Not on file  Social History Narrative   Retired Administrator    Social Determinants of Radio broadcast assistant Strain: Not on file  Food Insecurity: Not on file  Transportation Needs: Not on file  Physical Activity: Not on file  Stress: Not on file  Social Connections: Not on file  Intimate Partner Violence: Not on file    Outpatient Medications Prior to Visit  Medication Sig Dispense Refill   amLODipine (NORVASC) 5 MG tablet Take 2 tablets (10 mg total) by mouth daily. (Patient not taking: Reported on 12/21/2021)     Glecaprevir-Pibrentasvir (MAVYRET) 100-40 MG TABS Take 3 tablets by mouth daily with  breakfast. (Patient not taking: Reported on 12/21/2021) 84 tablet 1   hydrALAZINE (APRESOLINE) 10 MG tablet TAKE 1 TABLET BY MOUTH THREE TIMES A DAY 90 tablet 2   pantoprazole (PROTONIX) 40 MG tablet Take 1 tablet (40 mg total) by mouth daily. 30 tablet 11   rosuvastatin (CRESTOR) 10 MG tablet Take 1 tablet (10 mg total) by mouth daily. (Patient not taking: Reported on 12/21/2021) 90 tablet 0   No facility-administered medications prior to visit.    No Known Allergies  Review of Systems See pertinent positives and negatives per HPI.     Objective:    Physical Exam Vitals reviewed. Exam conducted with a chaperone present.  Constitutional:      Appearance: Normal appearance.  Cardiovascular:     Rate and Rhythm: Normal rate and regular rhythm.     Pulses: Normal pulses.     Heart sounds: Normal heart sounds.  Pulmonary:     Effort: Pulmonary effort is normal.     Breath sounds: Normal breath sounds.  Skin:    Findings: Rash present. Rash is purpuric and vesicular.       Neurological:     Mental Status: He is alert.     BP 136/68   Pulse 83   Temp (!) 97.2 F (36.2 C)   Wt 172 lb (78 kg)   SpO2 97%   BMI 22.69 kg/m   Wt Readings from Last 3 Encounters:  01/06/22 172 lb (78 kg)  12/21/21 166 lb (75.3 kg)  11/30/21 166 lb (75.3 kg)    Health Maintenance Due  Topic Date Due   COVID-19 Vaccine (1) Never done   Pneumonia Vaccine 43+ Years old (1 - PCV) Never done   Zoster Vaccines- Shingrix (1 of 2) Never done   INFLUENZA VACCINE  Never done    Lab Results  Component Value Date   TSH 1.670 09/27/2021   Lab Results  Component Value Date   WBC 3.7 (L) 10/11/2021   HGB 13.0 (L) 10/11/2021   HCT 38.5 10/11/2021   MCV 94.1 10/11/2021   PLT 330 10/11/2021   Lab Results  Component Value Date   NA 136 10/25/2021   K 4.9 10/25/2021   CO2 14 (L) 10/25/2021   GLUCOSE 87 10/25/2021   BUN 11 10/25/2021   CREATININE 0.79 10/25/2021   BILITOT 0.4 10/25/2021    ALKPHOS 245 (H) 10/25/2021   AST 30 10/25/2021   ALT 33 10/25/2021   PROT  6.8 10/25/2021   ALBUMIN 3.9 10/25/2021   CALCIUM 8.7 10/25/2021   ANIONGAP 8 05/25/2020   EGFR 98 10/25/2021   Lab Results  Component Value Date   CHOL 141 09/27/2021   Lab Results  Component Value Date   HDL 56 09/27/2021   Lab Results  Component Value Date   LDLCALC 69 09/27/2021   Lab Results  Component Value Date   TRIG 84 09/27/2021   Lab Results  Component Value Date   CHOLHDL 2.5 09/27/2021        Assessment & Plan:   1. Rash and nonspecific skin eruption - triamcinolone acetonide (KENALOG-40) injection 60 mg - predniSONE (DELTASONE) 20 MG tablet; Take 3 tablets (60 mg total) by mouth daily with breakfast for 3 days, THEN 2 tablets (40 mg total) daily with breakfast for 3 days, THEN 1 tablet (20 mg total) daily with breakfast for 3 days.  Dispense: 18 tablet; Refill: 0 - hydrOXYzine (VISTARIL) 25 MG capsule; Take 1 capsule (25 mg total) by mouth every 8 (eight) hours as needed.  Dispense: 30 capsule; Refill: 1 - doxycycline (VIBRA-TABS) 100 MG tablet; Take 1 tablet (100 mg total) by mouth 2 (two) times daily for 14 days.  Dispense: 28 tablet; Refill: 0 - Spotted Fever Group Antibodies - triamcinolone (KENALOG) 0.025 % cream; Apply 1 Application topically 2 (two) times daily.  Dispense: 30 g; Refill: 0 - Lyme Disease Serology w/Reflex - STI Profile       Kenalog 60 mg injection given in office Take Prednisone pack as prescribed Take Doxycycline twice daily for 14 days Take Hydroxyzine 25 mg as needed for itching We will call you with labs Consider Tecnu over the counter prior to doing yard work to prevent future rash Apply Triamcinolone cream to rash daily Follow-up in 1 week  Follow-up: 1-week  An After Visit Summary was printed and given to the patient.  I, Rip Harbour, NP, have reviewed all documentation for this visit. The documentation on 01/06/22 for the exam,  diagnosis, procedures, and orders are all accurate and complete.    Signed, Rip Harbour, NP [place] [phone]

## 2022-01-07 LAB — STI PROFILE
HCV Ab: REACTIVE — AB
HIV Screen 4th Generation wRfx: NONREACTIVE
Hep B Core Total Ab: NEGATIVE
Hep B Surface Ab, Qual: REACTIVE
Hepatitis B Surface Ag: NEGATIVE
RPR Ser Ql: NONREACTIVE

## 2022-01-07 LAB — HCV RT-PCR, QUANT (NON-GRAPH): Hepatitis C Quantitation: NOT DETECTED IU/mL

## 2022-01-09 LAB — SPOTTED FEVER GROUP ANTIBODIES
Spotted Fever Group IgG: <1:64 {titer}
Spotted Fever Group IgM: <1:64 {titer}

## 2022-01-09 LAB — LYME DISEASE SEROLOGY W/REFLEX: Lyme Total Antibody EIA: NEGATIVE

## 2022-01-16 ENCOUNTER — Encounter: Payer: Self-pay | Admitting: Nurse Practitioner

## 2022-01-16 ENCOUNTER — Ambulatory Visit (INDEPENDENT_AMBULATORY_CARE_PROVIDER_SITE_OTHER): Payer: Medicare Other | Admitting: Nurse Practitioner

## 2022-01-16 VITALS — BP 150/70 | HR 82 | Temp 97.0°F | Ht 73.0 in | Wt 171.6 lb

## 2022-01-16 DIAGNOSIS — R21 Rash and other nonspecific skin eruption: Secondary | ICD-10-CM

## 2022-01-16 NOTE — Progress Notes (Signed)
Subjective:  Patient ID: Justin Suarez, male    DOB: 1955-04-29  Age: 66 y.o. MRN: [id number]  Chief Complaint  Patient presents with   Rash   HPI: Pt presents for follow-up of rash. He was treated with a course of prednisone. Symptoms have improved. States rash left residual wounds that have been slow to heal. Denies pain, itching, or warmth around wounds.     Current Outpatient Medications on File Prior to Visit  Medication Sig Dispense Refill   amLODipine (NORVASC) 5 MG tablet Take 2 tablets (10 mg total) by mouth daily.     doxycycline (VIBRA-TABS) 100 MG tablet Take 1 tablet (100 mg total) by mouth 2 (two) times daily for 14 days. 28 tablet 0   hydrALAZINE (APRESOLINE) 10 MG tablet TAKE 1 TABLET BY MOUTH THREE TIMES A DAY 90 tablet 2   pantoprazole (PROTONIX) 40 MG tablet Take 1 tablet (40 mg total) by mouth daily. 30 tablet 11   hydrOXYzine (VISTARIL) 25 MG capsule Take 1 capsule (25 mg total) by mouth every 8 (eight) hours as needed. (Patient not taking: Reported on 01/16/2022) 30 capsule 1   No current facility-administered medications on file prior to visit.   Past Medical History:  Diagnosis Date   Abnormal CT scan, chest 06/2011   multiple lung nodules; needs repeat study in 3 to 12 months.   Arthritis    Hands   Chronic anticoagulation    Common bile duct dilation    COPD (chronic obstructive pulmonary disease) (HCC)    Pt denies   DVT (deep venous thrombosis) (HCC)    Gallstone pancreatitis    GERD (gastroesophageal reflux disease)    Hepatitis C    History of hiatal hernia    HTN (hypertension)    Noncompliance    Pancreatitis    Pancreatitis 06/2020   Pulmonary embolus (HCC)    Tick bite 07/2011   Tobacco abuse    Past Surgical History:  Procedure Laterality Date   ANKLE SURGERY Right 1998   3 surgeries.   CHOLECYSTECTOMY N/A 05/24/2020   Procedure: LAPAROSCOPIC CHOLECYSTECTOMY WITH INTRAOPERATIVE CHOLANGIOGRAM;  Surgeon: Greer Pickerel, MD;   Location: [facility];  Service: General;  Laterality: N/A;   ESOPHAGOGASTRODUODENOSCOPY (EGD) WITH PROPOFOL N/A 04/01/2020   Procedure: ESOPHAGOGASTRODUODENOSCOPY (EGD) WITH PROPOFOL;  Surgeon: Milus Banister, MD;  Location: [facility];  Service: Endoscopy;  Laterality: N/A;   EUS N/A 04/01/2020   Procedure: UPPER ENDOSCOPIC ULTRASOUND (EUS) RADIAL;  Surgeon: Milus Banister, MD;  Location: [facility];  Service: Endoscopy;  Laterality: N/A;   FINGER AMPUTATION Left 1970   first part of middle finger removed   FOOT SURGERY Right    x 3 from a fall   INGUINAL HERNIA REPAIR Right    years ago    Family History  Problem Relation Age of Onset   Kidney disease Mother        ESRD dialysis   Diabetes Mother    Heart disease Mother        CHF   Heart failure Mother    Heart disease Father    Heart disease Sister 65       CABG   Coronary artery disease Sister    Coronary artery disease Sister    Heart disease Sister    Cerebral aneurysm Sister    Atrial fibrillation Brother    Hypertension Brother    Esophageal cancer Neg Hx    Colon cancer Neg Hx    Rectal  cancer Neg Hx    Stomach cancer Neg Hx    Social History   Socioeconomic History   Marital status: Divorced    Spouse name: Not on file   Number of children: 0   Years of education: Not on file   Highest education level: Not on file  Occupational History   Occupation: retired    Comment: Long distance truck driver  Tobacco Use   Smoking status: Every Day    Packs/day: 0.25    Years: 43.00    Total pack years: 10.75    Types: Cigarettes   Smokeless tobacco: Never   Tobacco comments:    1 ppd for 40 years.  Vaping Use   Vaping Use: Never used  Substance and Sexual Activity   Alcohol use: Yes    Comment: Occasional. 4-5 beers 12 oz./week.    Drug use: No   Sexual activity: Not Currently  Other Topics Concern   Not on file  Social History Narrative   Retired Administrator    Social Determinants of Adult nurse Strain: Not on file  Food Insecurity: Not on file  Transportation Needs: Not on file  Physical Activity: Not on file  Stress: Not on file  Social Connections: Not on file    Review of Systems  Constitutional:  Negative for chills, fatigue and fever.  HENT:  Negative for congestion, ear pain and sore throat.   Respiratory:  Negative for cough and shortness of breath.   Cardiovascular:  Negative for chest pain and palpitations.  Gastrointestinal:  Negative for abdominal pain, constipation, diarrhea, nausea and vomiting.  Endocrine: Negative for polydipsia, polyphagia and polyuria.  Genitourinary:  Negative for dysuria and frequency.  Musculoskeletal:  Negative for arthralgias and back pain.  Neurological:  Negative for dizziness and headaches.  Psychiatric/Behavioral:  Negative for dysphoric mood. The patient is not nervous/anxious.      Objective:  BP (!) 150/70   Pulse 82   Temp (!) 97 F (36.1 C)   Ht '6\' 1"'$  (1.854 m)   Wt 171 lb 9.6 oz (77.8 kg)   SpO2 100%   BMI 22.64 kg/m      01/16/2022    2:09 PM 01/06/2022    8:41 AM 12/21/2021    2:47 PM  BP/Weight  Systolic BP 709 628 366  Diastolic BP 70 68 75  Wt. (Lbs) 171.6 172   BMI 22.64 kg/m2 22.69 kg/m2     Physical Exam Vitals reviewed.  Constitutional:      Appearance: Normal appearance.  Skin:    Capillary Refill: Capillary refill takes less than 2 seconds.     Findings: Lesion present.          Comments: Healing wounds to bilateral upper extremities  Neurological:     Mental Status: He is alert.       Lab Results  Component Value Date   WBC 3.7 (L) 10/11/2021   HGB 13.0 (L) 10/11/2021   HCT 38.5 10/11/2021   PLT 330 10/11/2021   GLUCOSE 87 10/25/2021   CHOL 141 09/27/2021   TRIG 84 09/27/2021   HDL 56 09/27/2021   LDLCALC 69 09/27/2021   ALT 33 10/25/2021   AST 30 10/25/2021   NA 136 10/25/2021   K 4.9 10/25/2021   CL 100 10/25/2021   CREATININE 0.79 10/25/2021    BUN 11 10/25/2021   CO2 14 (L) 10/25/2021   TSH 1.670 09/27/2021   INR 0.9 10/11/2021  Assessment & Plan:   1. Rash  -continue to monitor wounds for healing/infections    Follow-up: PRN  An After Visit Summary was printed and given to the patient.  I, Rip Harbour, NP, have reviewed all documentation for this visit. The documentation on 01/16/22 for the exam, diagnosis, procedures, and orders are all accurate and complete.    Signed, Rip Harbour, NP [place] [phone]

## 2022-01-19 ENCOUNTER — Other Ambulatory Visit: Payer: Self-pay | Admitting: Family Medicine

## 2022-01-19 DIAGNOSIS — I10 Essential (primary) hypertension: Secondary | ICD-10-CM

## 2022-01-25 ENCOUNTER — Ambulatory Visit: Payer: Medicare Other | Admitting: Internal Medicine

## 2022-01-31 ENCOUNTER — Encounter: Payer: Self-pay | Admitting: Family Medicine

## 2022-01-31 ENCOUNTER — Ambulatory Visit (INDEPENDENT_AMBULATORY_CARE_PROVIDER_SITE_OTHER): Payer: Medicare Other | Admitting: Family Medicine

## 2022-01-31 VITALS — BP 136/70 | HR 76 | Temp 97.5°F | Resp 18 | Ht 71.0 in | Wt 170.0 lb

## 2022-01-31 DIAGNOSIS — K861 Other chronic pancreatitis: Secondary | ICD-10-CM | POA: Diagnosis not present

## 2022-01-31 DIAGNOSIS — J410 Simple chronic bronchitis: Secondary | ICD-10-CM | POA: Diagnosis not present

## 2022-01-31 DIAGNOSIS — K2100 Gastro-esophageal reflux disease with esophagitis, without bleeding: Secondary | ICD-10-CM | POA: Diagnosis not present

## 2022-01-31 DIAGNOSIS — K862 Cyst of pancreas: Secondary | ICD-10-CM

## 2022-01-31 DIAGNOSIS — I10 Essential (primary) hypertension: Secondary | ICD-10-CM

## 2022-01-31 DIAGNOSIS — B182 Chronic viral hepatitis C: Secondary | ICD-10-CM

## 2022-01-31 DIAGNOSIS — K222 Esophageal obstruction: Secondary | ICD-10-CM

## 2022-01-31 MED ORDER — PANTOPRAZOLE SODIUM 40 MG PO TBEC: 40.0000 mg | DELAYED_RELEASE_TABLET | Freq: Every day | ORAL | 3 refills | Status: DC

## 2022-01-31 NOTE — Progress Notes (Signed)
Subjective:  Patient ID: Justin Suarez, male    DOB: June 19, 1955  Age: 66 y.o. MRN: [id number]  Chief Complaint  Patient presents with   COPD   Hypertension   Hyperlipidemia    HPI Hypertension: ON hydralazine 10 mg three times a day. Bp here is 164/70 but repeat bp was 136/70.   Hyperlipidemia: On crestor previously, but most recent lipid panel was at goal without medicine.    COPD:  dxd on ct scan. Never needed inhalers. Currently smoking. Down to 1/4 ppd. History of Pulmonary embolism in 2013. Not on any blood thinners.  Hepatitis c: completed MAVYRET. Patient follows with  Dr. Scarlette Shorts. EGD and dilated esophagus. Colonoscopy: 4 polyps. Due to repeat 3 years.    Current Outpatient Medications on File Prior to Visit  Medication Sig Dispense Refill   hydrALAZINE (APRESOLINE) 10 MG tablet TAKE 1 TABLET BY MOUTH THREE TIMES A DAY 270 tablet 1   No current facility-administered medications on file prior to visit.   Past Medical History:  Diagnosis Date   Abnormal CT scan, chest 06/2011   multiple lung nodules; needs repeat study in 3 to 12 months.   Arthritis    Hands   Chronic anticoagulation    Common bile duct dilation    COPD (chronic obstructive pulmonary disease) (HCC)    Pt denies   DVT (deep venous thrombosis) (HCC)    Gallstone pancreatitis    GERD (gastroesophageal reflux disease)    Hepatitis C    History of hiatal hernia    HTN (hypertension)    Noncompliance    Pancreatitis    Pancreatitis 06/2020   Pulmonary embolus (HCC)    Tick bite 07/2011   Tobacco abuse    Past Surgical History:  Procedure Laterality Date   ANKLE SURGERY Right 1998   3 surgeries.   CHOLECYSTECTOMY N/A 05/24/2020   Procedure: LAPAROSCOPIC CHOLECYSTECTOMY WITH INTRAOPERATIVE CHOLANGIOGRAM;  Surgeon: Greer Pickerel, MD;  Location: [facility];  Service: General;  Laterality: N/A;   ESOPHAGOGASTRODUODENOSCOPY (EGD) WITH PROPOFOL N/A 04/01/2020   Procedure: ESOPHAGOGASTRODUODENOSCOPY  (EGD) WITH PROPOFOL;  Surgeon: Milus Banister, MD;  Location: [facility];  Service: Endoscopy;  Laterality: N/A;   EUS N/A 04/01/2020   Procedure: UPPER ENDOSCOPIC ULTRASOUND (EUS) RADIAL;  Surgeon: Milus Banister, MD;  Location: [facility];  Service: Endoscopy;  Laterality: N/A;   FINGER AMPUTATION Left 1970   first part of middle finger removed   FOOT SURGERY Right    x 3 from a fall   INGUINAL HERNIA REPAIR Right    years ago    Family History  Problem Relation Age of Onset   Kidney disease Mother        ESRD dialysis   Diabetes Mother    Heart disease Mother        CHF   Heart failure Mother    Heart disease Father    Heart disease Sister 45       CABG   Coronary artery disease Sister    Coronary artery disease Sister    Heart disease Sister    Cerebral aneurysm Sister    Atrial fibrillation Brother    Hypertension Brother    Esophageal cancer Neg Hx    Colon cancer Neg Hx    Rectal cancer Neg Hx    Stomach cancer Neg Hx    Social History   Socioeconomic History   Marital status: Divorced    Spouse name: Not on file  Number of children: 0   Years of education: Not on file   Highest education level: Not on file  Occupational History   Occupation: retired    Comment: Long distance truck driver  Tobacco Use   Smoking status: Every Day    Packs/day: 0.25    Years: 43.00    Total pack years: 10.75    Types: Cigarettes   Smokeless tobacco: Never   Tobacco comments:    1 ppd for 40 years.  Vaping Use   Vaping Use: Never used  Substance and Sexual Activity   Alcohol use: Yes    Comment: Occasional. 4-5 beers 12 oz./week.    Drug use: No   Sexual activity: Not Currently  Other Topics Concern   Not on file  Social History Narrative   Retired Administrator    Social Determinants of Radio broadcast assistant Strain: Not on file  Food Insecurity: Not on file  Transportation Needs: Not on file  Physical Activity: Not on file  Stress: Not on file   Social Connections: Not on file    Review of Systems  Constitutional:  Negative for chills and fever.  HENT:  Negative for congestion, rhinorrhea and sore throat.   Respiratory:  Negative for cough and shortness of breath.   Cardiovascular:  Negative for chest pain and palpitations.  Gastrointestinal:  Positive for abdominal pain (epigastric pain). Negative for constipation, diarrhea, nausea and vomiting.  Genitourinary:  Negative for dysuria and urgency.  Musculoskeletal:  Positive for arthralgias (right hip pain) and back pain. Negative for myalgias.  Neurological:  Negative for dizziness and headaches.  Psychiatric/Behavioral:  Negative for dysphoric mood. The patient is not nervous/anxious.      Objective:  BP 136/70   Pulse 76   Temp (!) 97.5 F (36.4 C)   Resp 18   Ht '5\' 11"'$  (1.803 m)   Wt 170 lb (77.1 kg)   BMI 23.71 kg/m      01/31/2022    1:40 PM 01/31/2022    1:33 PM 01/16/2022    2:09 PM  BP/Weight  Systolic BP 191 478 295  Diastolic BP 70 70 70  Wt. (Lbs)  170 171.6  BMI  23.71 kg/m2 22.64 kg/m2    Physical Exam Vitals reviewed.  Constitutional:      Appearance: Normal appearance. He is normal weight.  Neck:     Vascular: No carotid bruit.  Cardiovascular:     Rate and Rhythm: Normal rate and regular rhythm.     Heart sounds: Normal heart sounds.  Pulmonary:     Effort: Pulmonary effort is normal.     Breath sounds: Normal breath sounds. No wheezing, rhonchi or rales.  Abdominal:     General: Bowel sounds are normal.     Palpations: Abdomen is soft.     Tenderness: There is abdominal tenderness (epigastric).  Neurological:     Mental Status: He is alert and oriented to person, place, and time.  Psychiatric:        Mood and Affect: Mood normal.        Behavior: Behavior normal.     Diabetic Foot Exam - Simple   No data filed      Lab Results  Component Value Date   WBC 7.0 01/31/2022   HGB 12.6 (L) 01/31/2022   HCT 36.7 (L)  01/31/2022   PLT 255 01/31/2022   GLUCOSE 98 01/31/2022   CHOL 141 09/27/2021   TRIG 84 09/27/2021   HDL  56 09/27/2021   LDLCALC 69 09/27/2021   ALT 116 (H) 01/31/2022   AST 64 (H) 01/31/2022   NA 133 (L) 01/31/2022   K 4.8 01/31/2022   CL 97 01/31/2022   CREATININE 0.73 (L) 01/31/2022   BUN 13 01/31/2022   CO2 24 01/31/2022   TSH 1.670 09/27/2021   INR 0.9 10/11/2021      Assessment & Plan:   Problem List Items Addressed This Visit       Cardiovascular and Mediastinum   Essential hypertension, benign    Well controlled.  No changes to medicines. Continue hydralazine 10 mg three times a day. Continue to work on eating a healthy diet and exercise.  Labs drawn today.          Respiratory   COPD (chronic obstructive pulmonary disease) (HCC)    Recommend quit smoking.  Currently, not needing inhalers.         Digestive   Chronic viral hepatitis C ([place])    Treated. Follow up with GI Scarlette Shorts, MD.)      Other chronic pancreatitis [facility])    Check labs. Came back elevated. Sent to Dr. Henrene Pastor (GI.) Recommendations called to patient by Dr. Blanch Media office.       Relevant Medications   pantoprazole (PROTONIX) 40 MG tablet   Other Relevant Orders   CBC with Differential/Platelet (Completed)   Comprehensive metabolic panel (Completed)   Cancer Antigen 19-9 (Completed)   Amylase (Completed)   Lipase (Completed)   Pancreatic cyst - Primary    Check ca19. Came back normal.       Relevant Orders   CBC with Differential/Platelet (Completed)   Comprehensive metabolic panel (Completed)   Cancer Antigen 19-9 (Completed)   Amylase (Completed)   Lipase (Completed)   Esophageal stricture    Recently dilated. Resolved.      Relevant Medications   pantoprazole (PROTONIX) 40 MG tablet   Gastroesophageal reflux disease with esophagitis without hemorrhage    Continue protonix 40 mg daily.       Relevant Medications   pantoprazole (PROTONIX) 40 MG tablet   .  Meds ordered this encounter  Medications   pantoprazole (PROTONIX) 40 MG tablet    Sig: Take 1 tablet (40 mg total) by mouth daily.    Dispense:  90 tablet    Refill:  3    Orders Placed This Encounter  Procedures   CBC with Differential/Platelet   Comprehensive metabolic panel   Cancer Antigen 19-9   Amylase   Lipase     Follow-up: Return in about 6 months (around 08/02/2022) for chronic fasting, awv after 06/05/2022 with KIM.Marland Kitchen  An After Visit Summary was printed and given to the patient.  Rochel Brome, MD [facility] [phone]

## 2022-02-01 LAB — COMPREHENSIVE METABOLIC PANEL
ALT: 116 IU/L — ABNORMAL HIGH (ref 0–44)
AST: 64 IU/L — ABNORMAL HIGH (ref 0–40)
Albumin/Globulin Ratio: 1.6 (ref 1.2–2.2)
Albumin: 3.9 g/dL (ref 3.9–4.9)
Alkaline Phosphatase: 432 IU/L — ABNORMAL HIGH (ref 44–121)
BUN/Creatinine Ratio: 18 (ref 10–24)
BUN: 13 mg/dL (ref 8–27)
Bilirubin Total: 0.4 mg/dL (ref 0.0–1.2)
CO2: 24 mmol/L (ref 20–29)
Calcium: 8.9 mg/dL (ref 8.6–10.2)
Chloride: 97 mmol/L (ref 96–106)
Creatinine, Ser: 0.73 mg/dL — ABNORMAL LOW (ref 0.76–1.27)
Globulin, Total: 2.4 g/dL (ref 1.5–4.5)
Glucose: 98 mg/dL (ref 70–99)
Potassium: 4.8 mmol/L (ref 3.5–5.2)
Sodium: 133 mmol/L — ABNORMAL LOW (ref 134–144)
Total Protein: 6.3 g/dL (ref 6.0–8.5)
eGFR: 100 mL/min/{1.73_m2} (ref >59)

## 2022-02-01 LAB — CBC WITH DIFFERENTIAL/PLATELET
Basophils Absolute: 0 10*3/uL (ref 0.0–0.2)
Basos: 1 %
EOS (ABSOLUTE): 0.2 10*3/uL (ref 0.0–0.4)
Eos: 3 %
Hematocrit: 36.7 % — ABNORMAL LOW (ref 37.5–51.0)
Hemoglobin: 12.6 g/dL — ABNORMAL LOW (ref 13.0–17.7)
Immature Grans (Abs): 0 10*3/uL (ref 0.0–0.1)
Immature Granulocytes: 0 %
Lymphocytes Absolute: 1.7 10*3/uL (ref 0.7–3.1)
Lymphs: 24 %
MCH: 32.6 pg (ref 26.6–33.0)
MCHC: 34.3 g/dL (ref 31.5–35.7)
MCV: 95 fL (ref 79–97)
Monocytes Absolute: 0.6 10*3/uL (ref 0.1–0.9)
Monocytes: 9 %
Neutrophils Absolute: 4.4 10*3/uL (ref 1.4–7.0)
Neutrophils: 63 %
Platelets: 255 10*3/uL (ref 150–450)
RBC: 3.87 x10E6/uL — ABNORMAL LOW (ref 4.14–5.80)
RDW: 12.6 % (ref 11.6–15.4)
WBC: 7 10*3/uL (ref 3.4–10.8)

## 2022-02-01 LAB — AMYLASE: Amylase: 249 U/L — ABNORMAL HIGH (ref 31–110)

## 2022-02-01 LAB — LIPASE: Lipase: 212 U/L — ABNORMAL HIGH (ref 13–78)

## 2022-02-01 LAB — CANCER ANTIGEN 19-9: CA 19-9: 10 U/mL (ref 0–35)

## 2022-02-01 NOTE — Progress Notes (Signed)
Yes, I did send these labs to you. Kirtis had brought an order requested by you at your appt in November 2023. Patient had some mild epigastric tenderness.   Please address labs with patient. Thank you. Dr. Tobie Poet   Blood count abnormal. Hb 13 dropped to 12.6. mildly low.  Liver function abnormal. Significantly worsened. Alk phos worsened.  Kidney function normal.  Amylase and lipase elevated. Concerning for chronic pancreatitis.  CA 19-9 normal.

## 2022-02-05 DIAGNOSIS — K2100 Gastro-esophageal reflux disease with esophagitis, without bleeding: Secondary | ICD-10-CM | POA: Insufficient documentation

## 2022-02-05 DIAGNOSIS — K222 Esophageal obstruction: Secondary | ICD-10-CM | POA: Insufficient documentation

## 2022-02-05 NOTE — Assessment & Plan Note (Signed)
Treated. Follow up with GI Scarlette Shorts, MD.)

## 2022-02-05 NOTE — Assessment & Plan Note (Signed)
Recently dilated. Resolved.

## 2022-02-05 NOTE — Assessment & Plan Note (Addendum)
Well controlled.  No changes to medicines. Continue hydralazine 10 mg three times a day. Continue to work on eating a healthy diet and exercise.  Labs drawn today.

## 2022-02-05 NOTE — Assessment & Plan Note (Signed)
Check labs. Came back elevated. Sent to Dr. Henrene Pastor (GI.) Recommendations called to patient by Dr. Blanch Media office.

## 2022-02-05 NOTE — Assessment & Plan Note (Signed)
Check ca19. Came back normal.

## 2022-02-05 NOTE — Assessment & Plan Note (Signed)
Recommend quit smoking.  Currently, not needing inhalers.

## 2022-02-05 NOTE — Assessment & Plan Note (Signed)
Continue protonix 40mg daily.  

## 2022-03-16 ENCOUNTER — Ambulatory Visit: Payer: Medicare Other | Admitting: Internal Medicine

## 2022-03-16 ENCOUNTER — Encounter: Payer: Self-pay | Admitting: Internal Medicine

## 2022-03-16 NOTE — Progress Notes (Deleted)
[place] for Infectious Disease  CHIEF COMPLAINT:    Follow up for hepatitis C follow up.   SUBJECTIVE:    Justin Suarez is a 67 y.o. male with PMHx as below who presents to the clinic for hepatitis C.   Patient here today for SVR visit after completion of Mavyret x 8 weeks.  No issues otherwise.  His recent viral load monitoring has been negative.  Please see A&P for the details of today's visit and status of the patient's medical problems.   Patient's Medications  New Prescriptions   No medications on file  Previous Medications   HYDRALAZINE (APRESOLINE) 10 MG TABLET    TAKE 1 TABLET BY MOUTH THREE TIMES A DAY   PANTOPRAZOLE (PROTONIX) 40 MG TABLET    Take 1 tablet (40 mg total) by mouth daily.  Modified Medications   No medications on file  Discontinued Medications   No medications on file      Past Medical History:  Diagnosis Date   Abnormal CT scan, chest 06/2011   multiple lung nodules; needs repeat study in 3 to 12 months.   Arthritis    Hands   Chronic anticoagulation    Common bile duct dilation    COPD (chronic obstructive pulmonary disease) (HCC)    Pt denies   DVT (deep venous thrombosis) (HCC)    Gallstone pancreatitis    GERD (gastroesophageal reflux disease)    Hepatitis C    History of hiatal hernia    HTN (hypertension)    Noncompliance    Pancreatitis    Pancreatitis 06/2020   Pulmonary embolus ([place])    Tick bite 07/2011   Tobacco abuse     Social History   Tobacco Use   Smoking status: Every Day    Packs/day: 0.25    Years: 43.00    Total pack years: 10.75    Types: Cigarettes   Smokeless tobacco: Never   Tobacco comments:    1 ppd for 40 years.  Vaping Use   Vaping Use: Never used  Substance Use Topics   Alcohol use: Yes    Comment: Occasional. 4-5 beers 12 oz./week.    Drug use: No    Family History  Problem Relation Age of Onset   Kidney disease Mother        ESRD dialysis   Diabetes Mother    Heart  disease Mother        CHF   Heart failure Mother    Heart disease Father    Heart disease Sister 26       CABG   Coronary artery disease Sister    Coronary artery disease Sister    Heart disease Sister    Cerebral aneurysm Sister    Atrial fibrillation Brother    Hypertension Brother    Esophageal cancer Neg Hx    Colon cancer Neg Hx    Rectal cancer Neg Hx    Stomach cancer Neg Hx     No Known Allergies  ROS   OBJECTIVE:    There were no vitals filed for this visit. There is no height or weight on file to calculate BMI.  Physical Exam   Labs and Microbiology:    Latest Ref Rng & Units 01/31/2022    2:04 PM 10/11/2021    9:00 AM 09/27/2021    7:59 AM  CBC  WBC 3.4 - 10.8 x10E3/uL 7.0  3.7  6.5   Hemoglobin 13.0 -  17.7 g/dL 12.6  13.0  13.5   Hematocrit 37.5 - 51.0 % 36.7  38.5  39.7   Platelets 150 - 450 x10E3/uL 255  330  259       Latest Ref Rng & Units 01/31/2022    2:04 PM 10/25/2021    1:54 PM 10/11/2021    9:00 AM  CMP  Glucose 70 - 99 mg/dL 98  87  89   BUN 8 - 27 mg/dL '13  11  9   '$ Creatinine 0.76 - 1.27 mg/dL 0.73  0.79  0.77   Sodium 134 - 144 mmol/L 133  136  138   Potassium 3.5 - 5.2 mmol/L 4.8  4.9  4.6   Chloride 96 - 106 mmol/L 97  100  101   CO2 20 - 29 mmol/L '24  14  27   '$ Calcium 8.6 - 10.2 mg/dL 8.9  8.7  8.9   Total Protein 6.0 - 8.5 g/dL 6.3  6.8  7.0   Total Bilirubin 0.0 - 1.2 mg/dL 0.4  0.4  0.2   Alkaline Phos 44 - 121 IU/L 432  245    AST 0 - 40 IU/L 64  30  31   ALT 0 - 44 IU/L 116  33  21    20      No results found for this or any previous visit (from the past 240 hour(s)).  Imaging: ***   ASSESSMENT & PLAN:    No problem-specific Assessment & Plan notes found for this encounter.   No orders of the defined types were placed in this encounter.    There are no diagnoses linked to this encounter.  Patient here today for repeat HCV RNA to confirm SVR.  If negative, no further follow up needed.  He is due for 2nd dose of  his Hep A vaccine and advised to schedule a nursing visit for this.     [facility] 03/16/2022, 7:22 AM

## 2022-03-23 ENCOUNTER — Telehealth: Payer: Self-pay

## 2022-03-23 NOTE — Telephone Encounter (Signed)
N/A (voicemail full) unable to leave a message for patient to call back and schedule Medicare Annual Wellness Visit (AWV) to be done in office or by telephone.  No hx of AWV eligible as of 10/21/20  Please schedule with Rhae Hammock, LPN if patient calls the office back.     20 Minutes appointment   Any questions, please call me at [phone]

## 2022-04-05 ENCOUNTER — Other Ambulatory Visit: Payer: Self-pay

## 2022-04-05 ENCOUNTER — Telehealth: Payer: Self-pay

## 2022-04-05 DIAGNOSIS — K862 Cyst of pancreas: Secondary | ICD-10-CM

## 2022-04-05 DIAGNOSIS — K861 Other chronic pancreatitis: Secondary | ICD-10-CM

## 2022-04-05 NOTE — Telephone Encounter (Signed)
Order in epic, rad scheduling to contact pt regarding appt.

## 2022-04-05 NOTE — Telephone Encounter (Signed)
-----   Message from Algernon Huxley, RN sent at 04/01/2020 12:19 PM EST ----- Regarding: MRI of pancreas Repeat MRI look at report

## 2022-07-28 IMAGING — MR MR ABDOMEN WO/W CM MRCP
17 of 22 series · 38 of 48 positions shown · IV contrast (8ML GADAVIST)
Comparison: 05/21/2020 abdominal sonogram.  12/02/2019 MRI abdomen.

CLINICAL DATA: Inpatient. Cholelithiasis and right upper quadrant
abdominal pain.

EXAM:
MRI ABDOMEN WITHOUT AND WITH CONTRAST (INCLUDING MRCP)
TECHNIQUE: Multiplanar multisequence MR imaging of the abdomen was performed
both before and after the administration of intravenous contrast.
Heavily T2-weighted images of the biliary and pancreatic ducts were
obtained, and three-dimensional MRCP images were rendered by post
processing.
CONTRAST:  8mL GADAVIST GADOBUTROL 1 MMOL/ML IV SOLN

[Series 3: T2 fat-sat · axial · 6.0mm · 1.25mm/px · 1 of 36 slices shown]
[im 1/36]
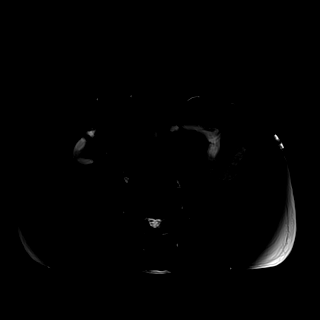

[Series 6: T2 · coronal · 6.0mm · 1.84mm/px · 1 of 35 slices shown (1 of 2)]
[im 1/35]
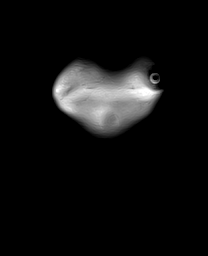

[Series 7: DWI · axial · 6.0mm · 1.49mm/px · z∈[-103,+149]mm · 2 of 72 slices shown (1 of 2)]
[im 1/72]
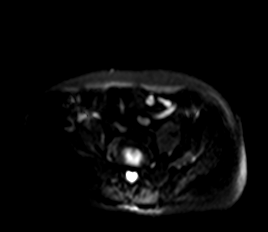
[im 72/72]
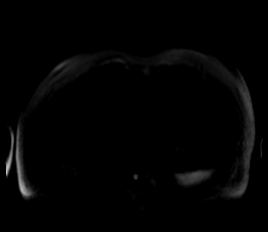

[Series 8: DWI · axial · 6.0mm · 1.49mm/px · 1 of 36 slices shown (2 of 2)]
[im 1/36]
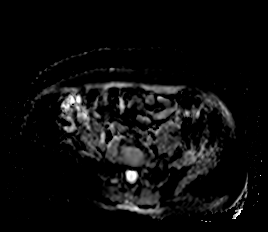

[Series 9: T1 · axial · 3.0mm · 1.25mm/px · z∈[-117,+144]mm · 3 of 88 slices shown (1 of 2)]
[im 1/88]
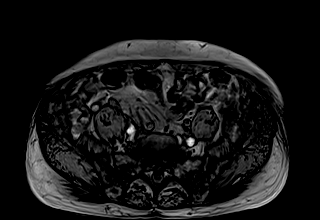
[im 44/88]
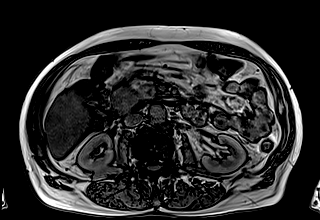
[im 88/88]
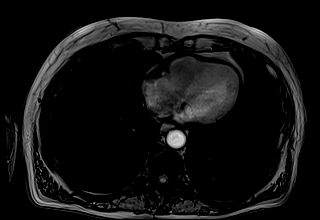

[Series 10: T1 · axial · 3.0mm · 1.25mm/px · z∈[-117,+144]mm · 3 of 88 slices shown (2 of 2)]
[im 1/88]
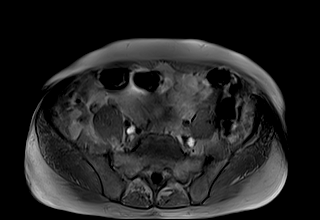
[im 44/88]
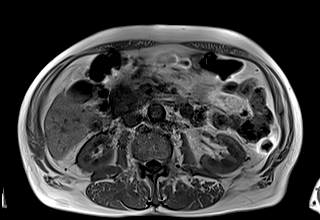
[im 88/88]
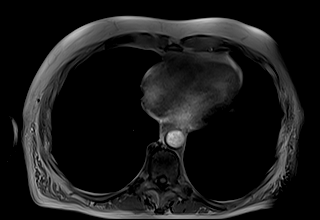

[Series 11: cor obl thk · sagittal · 50.0mm · 0.78mm/px · 1 of 9 slices shown]
[im 1/9]
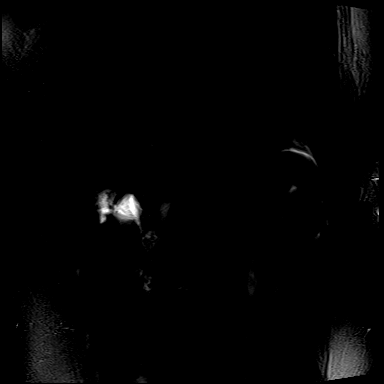

[Series 12: cor_3d_spc_trig-resp · 1 of 6 slices shown]
[im 1/6]
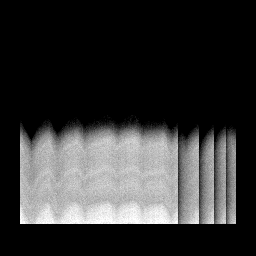

[Series 13: cor_3d_spc_trig · coronal · 1.0mm · 0.49mm/px · 3 of 72 slices shown]
[im 1/72]
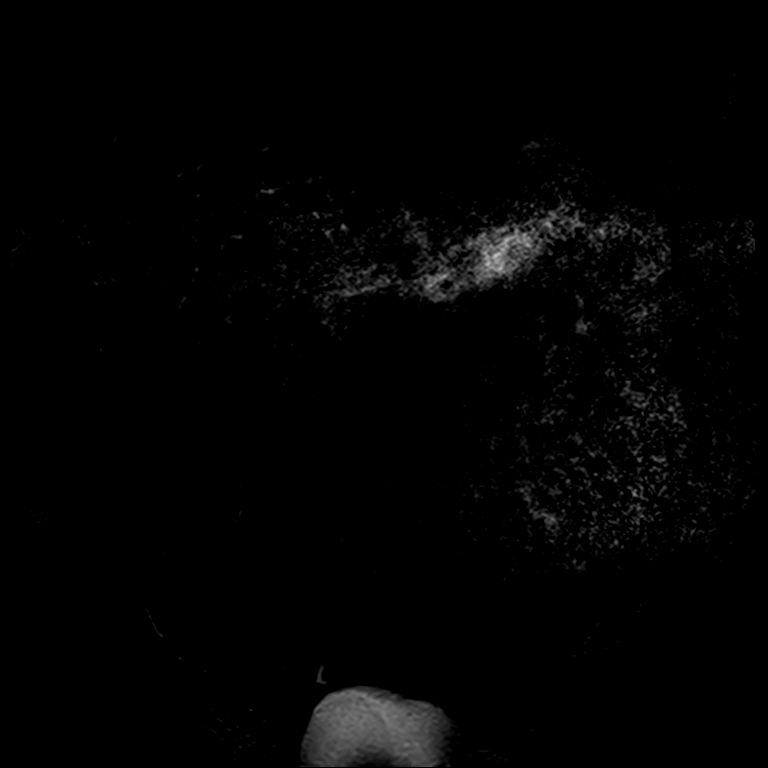
[im 36/72]
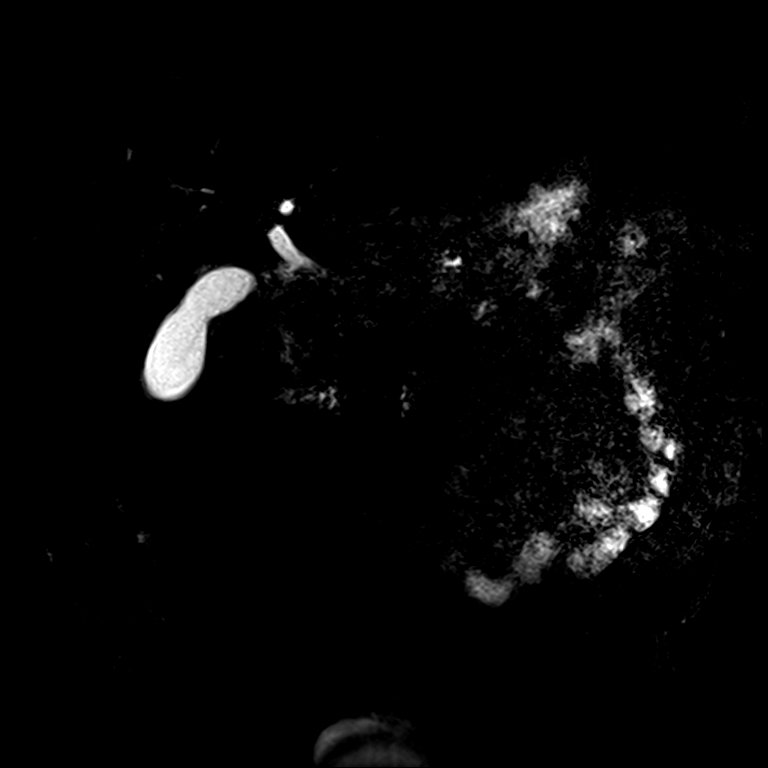
[im 72/72]
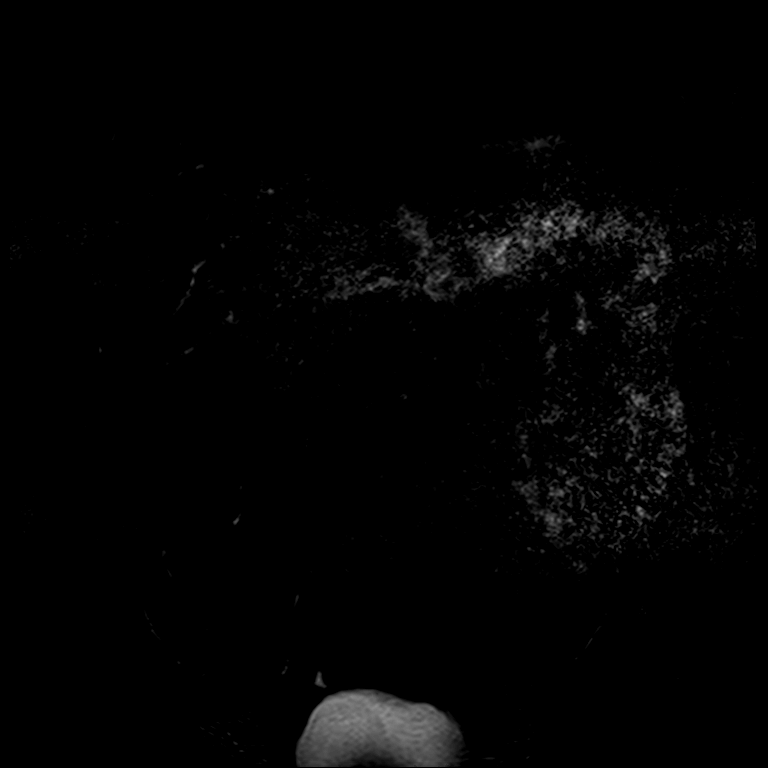

[Series 15: T2 · axial · 6.0mm · 1.64mm/px · 1 of 35 slices shown (2 of 2)]
[im 1/35]
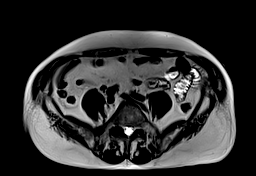

[Series 17: T1 dynamic · axial · 3.0mm · 1.25mm/px · z∈[-88,+149]mm · 3 of 80 slices shown (1 of 7)]
[im 1/80]
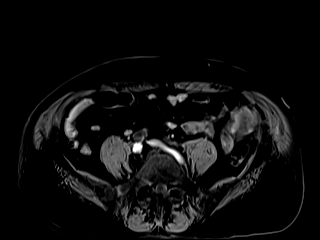
[im 40/80]
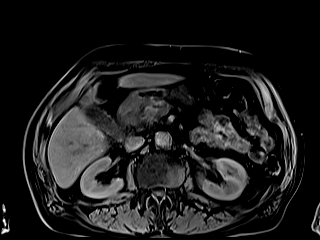
[im 80/80]
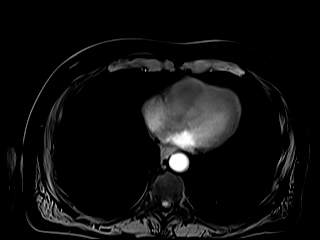

[Series 21: T1 dynamic · axial · 3.0mm · 1.25mm/px · z∈[-88,+149]mm · 3 of 80 slices shown (2 of 7)]
[im 1/80]
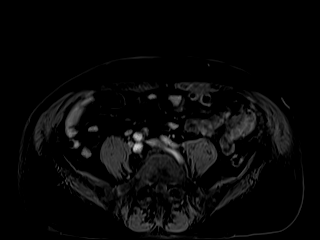
[im 40/80]
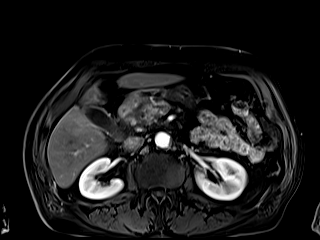
[im 80/80]
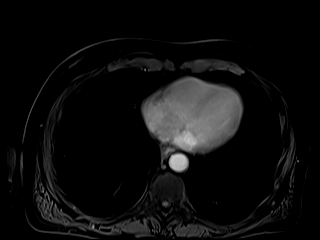

[Series 22: T1 dynamic · axial · 3.0mm · 1.25mm/px · z∈[-88,+149]mm · 3 of 80 slices shown (3 of 7)]
[im 1/80]
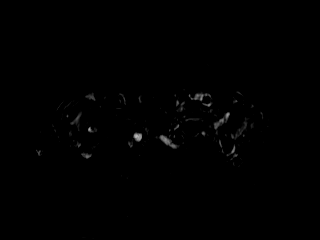
[im 40/80]
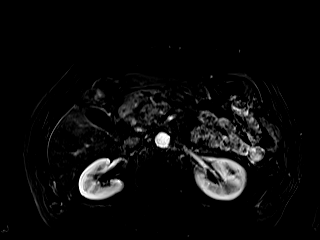
[im 80/80]
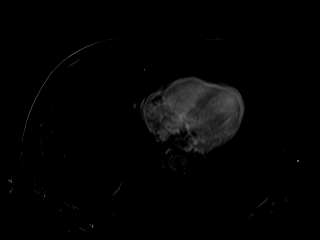

[Series 25: T1 dynamic · axial · 3.0mm · 1.25mm/px · z∈[-88,+149]mm · 3 of 80 slices shown (4 of 7)]
[im 1/80]
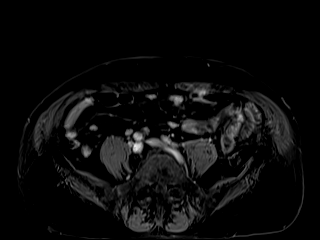
[im 40/80]
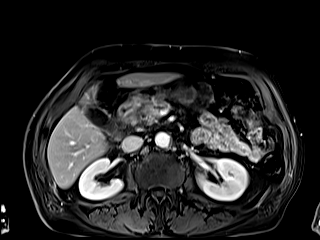
[im 80/80]
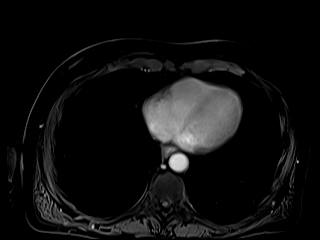

[Series 26: T1 dynamic · axial · 3.0mm · 1.25mm/px · z∈[-88,+149]mm · 3 of 80 slices shown (5 of 7)]
[im 1/80]
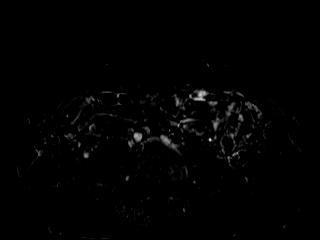
[im 40/80]
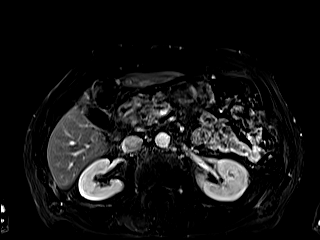
[im 80/80]
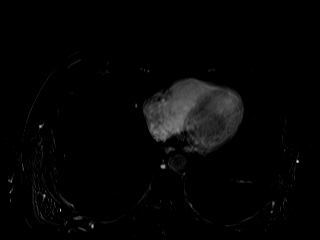

[Series 29: T1 dynamic · axial · 3.0mm · 1.25mm/px · z∈[-88,+149]mm · 3 of 80 slices shown (6 of 7)]
[im 1/80]
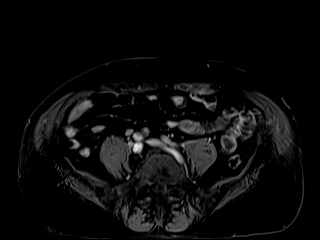
[im 40/80]
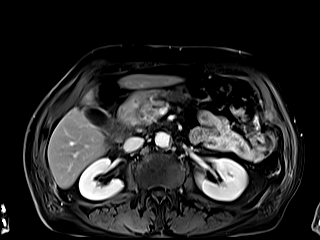
[im 80/80]
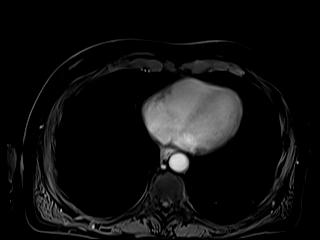

[Series 30: T1 dynamic · axial · 3.0mm · 1.25mm/px · z∈[-88,+149]mm · 3 of 80 slices shown (7 of 7)]
[im 1/80]
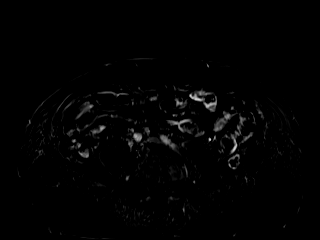
[im 40/80]
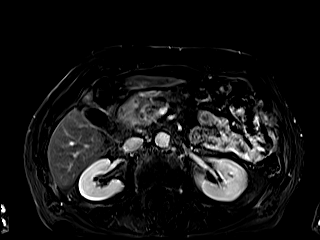
[im 80/80]
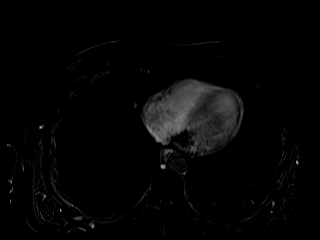

[38 of 48 positions shown; findings below may reference images not displayed]

FINDINGS: Lower chest: No acute abnormality at the lung bases.

Hepatobiliary: Normal liver size and configuration. Mild-to-moderate
diffuse hepatic steatosis. Tiny simple 0.3 cm inferior right liver
cyst. No suspicious liver masses. Nondistended gallbladder. Minimal
layering sludge in the fundal gallbladder. No gallbladder wall
thickening. No pericholecystic fluid. No discrete gallstones in the
gallbladder. No biliary ductal dilatation. Common bile duct diameter
8 mm, mildly dilated. Somewhat sharp tapering of the common bile
duct at the level of the pancreatic head. No biliary filling defects
to suggest choledocholithiasis. No beading of the bile ducts.

Pancreas: There is generalized enlargement and edema of the
pancreatic head and neck with associated mild peripancreatic and
periduodenal edema centered in the pancreaticoduodenal groove
(series 3/image 18). Findings are most suggestive of acute groove
pancreatitis. There is probable pancreas divisum. The main
pancreatic duct is mildly dilated (4 mm diameter) and irregular with
caliber transition at the level of the pancreatic head. There is a
complex 2.3 x 1.6 cm cystic mass in the pancreatic head (series
3/image 21) with mixed signal intensity, mild wall thickening and
enhancement and no internal solid enhancement, increased from 1.4 x
1.3 cm on 12/02/2019 MRI. Preserved pancreatic parenchymal
enhancement.

Spleen: Normal size. No mass.

Adrenals/Urinary Tract: Normal adrenals. No hydronephrosis. Normal
kidneys with no renal mass.

Stomach/Bowel: Normal non-distended stomach. Mild wall thickening
with surrounding edema in the descending duodenum, probably
reactive. Visualized small and large bowel is normal caliber, with
no bowel wall thickening.

Vascular/Lymphatic: Atherosclerotic nonaneurysmal abdominal aorta.
Patent portal, splenic, hepatic and renal veins. No pathologically
enlarged lymph nodes in the abdomen.

Other: No abdominal ascites or focal fluid collection.

Musculoskeletal: No aggressive appearing focal osseous lesions.
IMPRESSION: 1. Complex abnormal appearance of the pancreatic head and neck
region, most suggestive of acute non-necrotizing groove pancreatitis
with enlarging 2.3 cm pancreatic head pseudocyst.
2. Double duct sign is present with mildly dilated (8 mm diameter)
common bile duct and mildly dilated (4 mm diameter) irregular main
pancreatic duct, both demonstrating somewhat abrupt caliber
transitions at the level of the pancreatic head. An occult
pancreatic head neoplasm is very difficult to exclude. As such, ERCP
with endoscopic ultrasound is suggested when clinically feasible.
Short-term follow-up MRI abdomen without and with IV contrast is
also recommended in 3 months.
3. No appreciable gallstones. Minimal gallbladder sludge. No
choledocholithiasis.
4. Mild-to-moderate diffuse hepatic steatosis.

## 2022-08-29 ENCOUNTER — Telehealth: Payer: Self-pay

## 2022-08-29 NOTE — Telephone Encounter (Signed)
I called the patient today to see about getting an appointment scheduled for a chronic f.up and a awv. Separate appointments. Looks like this patient is currently overdue for an appointment. I was unable to leave a message on the number listed in the chart

## 2022-08-30 ENCOUNTER — Encounter: Payer: Self-pay | Admitting: Family Medicine

## 2022-09-06 ENCOUNTER — Inpatient Hospital Stay (HOSPITAL_COMMUNITY)
Admission: EM | Admit: 2022-09-06 | Discharge: 2022-09-08 | DRG: 481 | Disposition: A | Discharge status: Home Health | Payer: 59 | Attending: Family Medicine | Admitting: Family Medicine

## 2022-09-06 ENCOUNTER — Encounter (HOSPITAL_COMMUNITY): Payer: Self-pay

## 2022-09-06 ENCOUNTER — Emergency Department (HOSPITAL_COMMUNITY): Payer: 59

## 2022-09-06 ENCOUNTER — Inpatient Hospital Stay (HOSPITAL_COMMUNITY): Payer: 59

## 2022-09-06 ENCOUNTER — Other Ambulatory Visit: Payer: Self-pay

## 2022-09-06 DIAGNOSIS — Z86718 Personal history of other venous thrombosis and embolism: Secondary | ICD-10-CM

## 2022-09-06 DIAGNOSIS — K219 Gastro-esophageal reflux disease without esophagitis: Secondary | ICD-10-CM | POA: Diagnosis present

## 2022-09-06 DIAGNOSIS — K861 Other chronic pancreatitis: Secondary | ICD-10-CM | POA: Diagnosis not present

## 2022-09-06 DIAGNOSIS — S72009A Fracture of unspecified part of neck of unspecified femur, initial encounter for closed fracture: Secondary | ICD-10-CM | POA: Diagnosis not present

## 2022-09-06 DIAGNOSIS — W1830XA Fall on same level, unspecified, initial encounter: Secondary | ICD-10-CM | POA: Diagnosis present

## 2022-09-06 DIAGNOSIS — E782 Mixed hyperlipidemia: Secondary | ICD-10-CM | POA: Diagnosis present

## 2022-09-06 DIAGNOSIS — F10120 Alcohol abuse with intoxication, uncomplicated: Secondary | ICD-10-CM | POA: Diagnosis present

## 2022-09-06 DIAGNOSIS — R918 Other nonspecific abnormal finding of lung field: Secondary | ICD-10-CM | POA: Diagnosis not present

## 2022-09-06 DIAGNOSIS — Z5900 Homelessness unspecified: Secondary | ICD-10-CM | POA: Diagnosis not present

## 2022-09-06 DIAGNOSIS — S79919A Unspecified injury of unspecified hip, initial encounter: Secondary | ICD-10-CM | POA: Diagnosis not present

## 2022-09-06 DIAGNOSIS — K863 Pseudocyst of pancreas: Secondary | ICD-10-CM | POA: Diagnosis not present

## 2022-09-06 DIAGNOSIS — S72001A Fracture of unspecified part of neck of right femur, initial encounter for closed fracture: Secondary | ICD-10-CM

## 2022-09-06 DIAGNOSIS — J449 Chronic obstructive pulmonary disease, unspecified: Secondary | ICD-10-CM | POA: Diagnosis present

## 2022-09-06 DIAGNOSIS — F17210 Nicotine dependence, cigarettes, uncomplicated: Secondary | ICD-10-CM | POA: Diagnosis present

## 2022-09-06 DIAGNOSIS — Z86711 Personal history of pulmonary embolism: Secondary | ICD-10-CM | POA: Diagnosis not present

## 2022-09-06 DIAGNOSIS — I10 Essential (primary) hypertension: Secondary | ICD-10-CM | POA: Diagnosis not present

## 2022-09-06 DIAGNOSIS — Z01818 Encounter for other preprocedural examination: Secondary | ICD-10-CM | POA: Diagnosis not present

## 2022-09-06 DIAGNOSIS — F10920 Alcohol use, unspecified with intoxication, uncomplicated: Secondary | ICD-10-CM

## 2022-09-06 DIAGNOSIS — Z91199 Patient's noncompliance with other medical treatment and regimen due to unspecified reason: Secondary | ICD-10-CM

## 2022-09-06 DIAGNOSIS — S72002A Fracture of unspecified part of neck of left femur, initial encounter for closed fracture: Secondary | ICD-10-CM | POA: Diagnosis not present

## 2022-09-06 DIAGNOSIS — Z9049 Acquired absence of other specified parts of digestive tract: Secondary | ICD-10-CM

## 2022-09-06 DIAGNOSIS — M19042 Primary osteoarthritis, left hand: Secondary | ICD-10-CM | POA: Diagnosis not present

## 2022-09-06 DIAGNOSIS — Z8249 Family history of ischemic heart disease and other diseases of the circulatory system: Secondary | ICD-10-CM

## 2022-09-06 DIAGNOSIS — Z720 Tobacco use: Secondary | ICD-10-CM | POA: Diagnosis not present

## 2022-09-06 DIAGNOSIS — Y908 Blood alcohol level of 240 mg/100 ml or more: Secondary | ICD-10-CM | POA: Diagnosis present

## 2022-09-06 DIAGNOSIS — S72142A Displaced intertrochanteric fracture of left femur, initial encounter for closed fracture: Secondary | ICD-10-CM | POA: Diagnosis not present

## 2022-09-06 DIAGNOSIS — Z89022 Acquired absence of left finger(s): Secondary | ICD-10-CM

## 2022-09-06 DIAGNOSIS — Z79899 Other long term (current) drug therapy: Secondary | ICD-10-CM

## 2022-09-06 DIAGNOSIS — Z7901 Long term (current) use of anticoagulants: Secondary | ICD-10-CM | POA: Diagnosis not present

## 2022-09-06 DIAGNOSIS — M19041 Primary osteoarthritis, right hand: Secondary | ICD-10-CM | POA: Diagnosis present

## 2022-09-06 DIAGNOSIS — Z743 Need for continuous supervision: Secondary | ICD-10-CM | POA: Diagnosis not present

## 2022-09-06 DIAGNOSIS — B1920 Unspecified viral hepatitis C without hepatic coma: Secondary | ICD-10-CM | POA: Diagnosis present

## 2022-09-06 DIAGNOSIS — M160 Bilateral primary osteoarthritis of hip: Secondary | ICD-10-CM | POA: Diagnosis not present

## 2022-09-06 DIAGNOSIS — S0990XA Unspecified injury of head, initial encounter: Secondary | ICD-10-CM | POA: Diagnosis not present

## 2022-09-06 LAB — CBC WITH DIFFERENTIAL/PLATELET
Abs Immature Granulocytes: 0.03 10*3/uL (ref 0.00–0.07)
Basophils Absolute: 0.1 10*3/uL (ref 0.0–0.1)
Basophils Relative: 1 %
Eosinophils Absolute: 0.1 10*3/uL (ref 0.0–0.5)
Eosinophils Relative: 2 %
HCT: 37 % — ABNORMAL LOW (ref 39.0–52.0)
Hemoglobin: 12.5 g/dL — ABNORMAL LOW (ref 13.0–17.0)
Immature Granulocytes: 0 %
Lymphocytes Relative: 29 %
Lymphs Abs: 2.3 10*3/uL (ref 0.7–4.0)
MCH: 31.9 pg (ref 26.0–34.0)
MCHC: 33.8 g/dL (ref 30.0–36.0)
MCV: 94.4 fL (ref 80.0–100.0)
Monocytes Absolute: 0.6 10*3/uL (ref 0.1–1.0)
Monocytes Relative: 8 %
Neutro Abs: 4.7 10*3/uL (ref 1.7–7.7)
Neutrophils Relative %: 60 %
Platelets: 213 10*3/uL (ref 150–400)
RBC: 3.92 MIL/uL — ABNORMAL LOW (ref 4.22–5.81)
RDW: 13.5 % (ref 11.5–15.5)
WBC: 7.8 10*3/uL (ref 4.0–10.5)
nRBC: 0 % (ref 0.0–0.2)

## 2022-09-06 LAB — COMPREHENSIVE METABOLIC PANEL
ALT: 31 U/L (ref 0–44)
AST: 30 U/L (ref 15–41)
Albumin: 3.7 g/dL (ref 3.5–5.0)
Alkaline Phosphatase: 106 U/L (ref 38–126)
Anion gap: 11 (ref 5–15)
BUN: 7 mg/dL — ABNORMAL LOW (ref 8–23)
CO2: 19 mmol/L — ABNORMAL LOW (ref 22–32)
Calcium: 8 mg/dL — ABNORMAL LOW (ref 8.9–10.3)
Chloride: 101 mmol/L (ref 98–111)
Creatinine, Ser: 0.68 mg/dL (ref 0.61–1.24)
GFR, Estimated: >60 mL/min (ref >60)
Glucose, Bld: 96 mg/dL (ref 70–99)
Potassium: 3.6 mmol/L (ref 3.5–5.1)
Sodium: 131 mmol/L — ABNORMAL LOW (ref 135–145)
Total Bilirubin: 0.5 mg/dL (ref 0.3–1.2)
Total Protein: 6.7 g/dL (ref 6.5–8.1)

## 2022-09-06 LAB — I-STAT CHEM 8, ED
BUN: 4 mg/dL — ABNORMAL LOW (ref 8–23)
Calcium, Ion: 1.09 mmol/L — ABNORMAL LOW (ref 1.15–1.40)
Chloride: 97 mmol/L — ABNORMAL LOW (ref 98–111)
Creatinine, Ser: 1 mg/dL (ref 0.61–1.24)
Glucose, Bld: 99 mg/dL (ref 70–99)
HCT: 37 % — ABNORMAL LOW (ref 39.0–52.0)
Hemoglobin: 12.6 g/dL — ABNORMAL LOW (ref 13.0–17.0)
Potassium: 3.8 mmol/L (ref 3.5–5.1)
Sodium: 134 mmol/L — ABNORMAL LOW (ref 135–145)
TCO2: 25 mmol/L (ref 22–32)

## 2022-09-06 LAB — SURGICAL PCR SCREEN
MRSA, PCR: NEGATIVE
Staphylococcus aureus: NEGATIVE

## 2022-09-06 LAB — MAGNESIUM: Magnesium: 1.8 mg/dL (ref 1.7–2.4)

## 2022-09-06 LAB — ETHANOL: Alcohol, Ethyl (B): 241 mg/dL — ABNORMAL HIGH (ref <10)

## 2022-09-06 LAB — PHOSPHORUS: Phosphorus: 3.6 mg/dL (ref 2.5–4.6)

## 2022-09-06 LAB — PROTIME-INR
INR: 1 (ref 0.8–1.2)
Prothrombin Time: 13.7 seconds (ref 11.4–15.2)

## 2022-09-06 MED ORDER — THIAMINE HCL 100 MG/ML IJ SOLN
100.0000 mg | Freq: Every day | INTRAMUSCULAR | Status: DC
  Filled 2022-09-06: qty 2

## 2022-09-06 MED ORDER — ACETAMINOPHEN 650 MG RE SUPP: 650.0000 mg | Freq: Four times a day (QID) | RECTAL | Status: DC | PRN

## 2022-09-06 MED ORDER — ADULT MULTIVITAMIN W/MINERALS CH
1.0000 | ORAL_TABLET | Freq: Every day | ORAL | Status: DC
  Administered 2022-09-06 – 2022-09-08 (×2): 1 via ORAL
  Filled 2022-09-06 (×2): qty 1

## 2022-09-06 MED ORDER — MORPHINE SULFATE (PF) 2 MG/ML IV SOLN
1.0000 mg | Freq: Once | INTRAVENOUS | Status: AC | PRN
  Administered 2022-09-06: 1 mg via INTRAVENOUS
  Filled 2022-09-06: qty 1

## 2022-09-06 MED ORDER — MORPHINE SULFATE (PF) 2 MG/ML IV SOLN: 1.0000 mg | INTRAVENOUS | Status: DC | PRN

## 2022-09-06 MED ORDER — ONDANSETRON HCL 4 MG/2ML IJ SOLN: 4.0000 mg | Freq: Four times a day (QID) | INTRAMUSCULAR | Status: DC | PRN

## 2022-09-06 MED ORDER — FOLIC ACID 1 MG PO TABS
1.0000 mg | ORAL_TABLET | Freq: Every day | ORAL | Status: DC
  Administered 2022-09-06 – 2022-09-08 (×2): 1 mg via ORAL
  Filled 2022-09-06 (×2): qty 1

## 2022-09-06 MED ORDER — NICOTINE 14 MG/24HR TD PT24
14.0000 mg | MEDICATED_PATCH | Freq: Every day | TRANSDERMAL | Status: DC
  Administered 2022-09-06: 14 mg via TRANSDERMAL
  Filled 2022-09-06 (×3): qty 1

## 2022-09-06 MED ORDER — ONDANSETRON HCL 4 MG/2ML IJ SOLN
4.0000 mg | Freq: Once | INTRAMUSCULAR | Status: AC
  Administered 2022-09-06: 4 mg via INTRAVENOUS
  Filled 2022-09-06: qty 2

## 2022-09-06 MED ORDER — ONDANSETRON HCL 4 MG PO TABS: 4.0000 mg | ORAL_TABLET | Freq: Four times a day (QID) | ORAL | Status: DC | PRN

## 2022-09-06 MED ORDER — MORPHINE SULFATE (PF) 2 MG/ML IV SOLN
2.0000 mg | INTRAVENOUS | Status: DC | PRN
  Administered 2022-09-06 (×2): 2 mg via INTRAVENOUS
  Filled 2022-09-06 (×2): qty 1

## 2022-09-06 MED ORDER — HYDRALAZINE HCL 20 MG/ML IJ SOLN: 5.0000 mg | INTRAMUSCULAR | Status: DC | PRN

## 2022-09-06 MED ORDER — ACETAMINOPHEN 325 MG PO TABS
650.0000 mg | ORAL_TABLET | Freq: Four times a day (QID) | ORAL | Status: DC | PRN
  Administered 2022-09-06: 650 mg via ORAL
  Filled 2022-09-06: qty 2

## 2022-09-06 MED ORDER — FENTANYL CITRATE PF 50 MCG/ML IJ SOSY
25.0000 ug | PREFILLED_SYRINGE | Freq: Once | INTRAMUSCULAR | Status: AC
  Administered 2022-09-06: 25 ug via INTRAVENOUS
  Filled 2022-09-06: qty 1

## 2022-09-06 MED ORDER — OXYCODONE HCL 5 MG PO TABS
5.0000 mg | ORAL_TABLET | ORAL | Status: DC | PRN
  Administered 2022-09-06 (×3): 10 mg via ORAL
  Filled 2022-09-06 (×3): qty 2

## 2022-09-06 MED ORDER — ENOXAPARIN SODIUM 40 MG/0.4ML IJ SOSY
40.0000 mg | PREFILLED_SYRINGE | INTRAMUSCULAR | Status: DC
  Administered 2022-09-06: 40 mg via SUBCUTANEOUS
  Filled 2022-09-06: qty 0.4

## 2022-09-06 MED ORDER — LORAZEPAM 2 MG/ML IJ SOLN: 1.0000 mg | INTRAMUSCULAR | Status: DC | PRN

## 2022-09-06 MED ORDER — THIAMINE MONONITRATE 100 MG PO TABS
100.0000 mg | ORAL_TABLET | Freq: Every day | ORAL | Status: DC
  Administered 2022-09-06 – 2022-09-08 (×2): 100 mg via ORAL
  Filled 2022-09-06 (×2): qty 1

## 2022-09-06 MED ORDER — SENNOSIDES-DOCUSATE SODIUM 8.6-50 MG PO TABS
1.0000 | ORAL_TABLET | Freq: Every evening | ORAL | Status: DC | PRN
  Administered 2022-09-06 – 2022-09-07 (×2): 1 via ORAL
  Filled 2022-09-06 (×2): qty 1

## 2022-09-06 MED ORDER — LORAZEPAM 1 MG PO TABS: 1.0000 mg | ORAL_TABLET | ORAL | Status: DC | PRN

## 2022-09-06 MED ORDER — FENTANYL CITRATE PF 50 MCG/ML IJ SOSY
50.0000 ug | PREFILLED_SYRINGE | Freq: Once | INTRAMUSCULAR | Status: AC
  Administered 2022-09-06: 50 ug via INTRAVENOUS
  Filled 2022-09-06: qty 1

## 2022-09-06 MED ORDER — MUPIROCIN 2 % EX OINT
1.0000 | TOPICAL_OINTMENT | Freq: Two times a day (BID) | CUTANEOUS | Status: DC
  Filled 2022-09-06: qty 22

## 2022-09-06 NOTE — Progress Notes (Signed)
HOSPITALIST ROUNDING NOTE ASKIA HAZELIP NWG:[id number]  DOB: January 02, 1956  DOA: 09/06/2022  PCP: [name], MD  09/06/2022,7:32 AM   LOS: 0 days      Code Status: Full code   From: Home lives in [place] in an apartment current  Dispo: Probable skilled     67 year old white male Remote DVT PE 2013 no anticoagulation Reflux HTN Known chronic pancreatitis since 03/2020 + pseudocyst +, - symptomatic gallstone pancreatitis on admission 4/1 through 05/25/2020 status post lap chole 05/24/2020-was to receive repeat EUS/MRI pancreatic head/pseudocyst Smoker + COPD Chronic hep C which was treated by Dr. Marina Goodell? HTN  Came from home 08/2015 a.m. after having a fall in elevator denies hitting head-landed on left hip Found to have intertrochanteric hip fracture-orthopedics Dr. Yevette Edwards consulted patient kept n.p.o.-Rx morphine Ativan nicotine patch fentanyl etc. in ED-CT head negative for intracranial injury DG hip = intertrochanteric minimally displaced fracture left hip CXR no active disease Hemoglobin 12.5 WBC 7.8 platelet 213--sodium 131 chloride 19 INR 1.0  Plan  Intertrochanteric hip fracture Await input from orthopedics appreciate their assistance in advance Pain control morphine 2 mg every 4 as needed severe pain, add oxycodone 1-2 p.o. every 6 as needed mild pain  Underlying EtOH drinks 4-5 beers a day-EtOH level was 241 on arrival Known chronic pancreatitis + pseudocyst No active signs of withdrawal at this time CIWA is 0-maintain on lorazepam protocol He is not on Creon or any other meds  Previous hepatitis C Fully treated by Dr. Marina Goodell previously-interval labs  Smoker Still smokes about half pack per day will order nicotine patch he is precontemplative  HTN not on meds  Reflux not on meds   DVT prophylaxis: Lovenox  Status is: Inpatient Remains inpatient appropriate because:   Requires surgery-orthopedics is aware    Subjective: Awake pleasant coherent and 6/10 pain in  hip No fever chills nausea vomiting no chest pain  Objective + exam Vitals:   09/06/22 0400 09/06/22 0430 09/06/22 0452 09/06/22 0509  BP: (!) 150/76 138/75 (!) 140/70 139/76  Pulse: 81 72 70 72  Resp: 11 19 15 18   Temp:   98 F (36.7 C) 98.3 F (36.8 C)  TempSrc:   Oral   SpO2: 96% 96% 97% 100%  Weight:    72.8 kg  Height:    5\' 11"  (1.803 m)   Filed Weights   09/06/22 0509  Weight: 72.8 kg    Examination:  EOMI NCAT looks about stated age well-built nourished No icterus no pallor Chest is clear no wheeze rales rhonchi ROM is intact No tremor or tremulousness Straight leg raise is difficult on the right side Abdomen is soft  Data Reviewed: reviewed   CBC    Component Value Date/Time   WBC 7.8 09/06/2022 0100   RBC 3.92 (L) 09/06/2022 0100   HGB 12.6 (L) 09/06/2022 0142   HGB 12.6 (L) 01/31/2022 1404   HCT 37.0 (L) 09/06/2022 0142   HCT 36.7 (L) 01/31/2022 1404   PLT 213 09/06/2022 0100   PLT 255 01/31/2022 1404   MCV 94.4 09/06/2022 0100   MCV 95 01/31/2022 1404   MCH 31.9 09/06/2022 0100   MCHC 33.8 09/06/2022 0100   RDW 13.5 09/06/2022 0100   RDW 12.6 01/31/2022 1404   LYMPHSABS 2.3 09/06/2022 0100   LYMPHSABS 1.7 01/31/2022 1404   MONOABS 0.6 09/06/2022 0100   EOSABS 0.1 09/06/2022 0100   EOSABS 0.2 01/31/2022 1404   BASOSABS 0.1 09/06/2022 0100   BASOSABS 0.0 01/31/2022  1404      Latest Ref Rng & Units 09/06/2022    1:42 AM 09/06/2022    1:00 AM 01/31/2022    2:04 PM  CMP  Glucose 70 - 99 mg/dL 99  96  98   BUN 8 - 23 mg/dL 4  7  13    Creatinine 0.61 - 1.24 mg/dL 4.09  8.11  9.14   Sodium 135 - 145 mmol/L 134  131  133   Potassium 3.5 - 5.1 mmol/L 3.8  3.6  4.8   Chloride 98 - 111 mmol/L 97  101  97   CO2 22 - 32 mmol/L  19  24   Calcium 8.9 - 10.3 mg/dL  8.0  8.9   Total Protein 6.5 - 8.1 g/dL  6.7  6.3   Total Bilirubin 0.3 - 1.2 mg/dL  0.5  0.4   Alkaline Phos 38 - 126 U/L  106  432   AST 15 - 41 U/L  30  64   ALT 0 - 44 U/L  31  116       Scheduled Meds:  enoxaparin (LOVENOX) injection  40 mg Subcutaneous Q24H   folic acid  1 mg Oral Daily   multivitamin with minerals  1 tablet Oral Daily   nicotine  14 mg Transdermal Daily   thiamine  100 mg Oral Daily   Or   thiamine  100 mg Intravenous Daily   Continuous Infusions:  Time  44  Rhetta Mura, MD  [facility]

## 2022-09-06 NOTE — Progress Notes (Signed)
Patient ID: Justin Suarez, male   DOB: September 25, 1955, 67 y.o.   MRN: [id number]   Left intertrochanteric hip fracture  To OR tomorrow for IM nail left hip  Full note to follow  Orders placed for diet, NPO status, consent No lovenox

## 2022-09-06 NOTE — TOC Initial Note (Signed)
Transition of Care [facility]) - Initial/Assessment Note   Patient Details  Name: Justin Suarez MRN: [id number] Date of Birth: September 15, 1955  Transition of Care [facility]) CM/SW Contact:    Ewing Schlein, LCSW Phone Number: 09/06/2022, 11:31 AM  Clinical Narrative: [facility] consulted for ETOH use resources. CSW met with patient to discuss consult. Patient declined resources at this time. Patient reported he plans to discharge to his girlfriend's home. Awaiting ortho consult.  Expected Discharge Plan:  (TBD) Barriers to Discharge: Continued Medical Work up  Patient Goals and CMS Choice Patient states their goals for this hospitalization and ongoing recovery are:: Return to girlfriend's home  Expected Discharge Plan and Services In-house Referral: Clinical Social Work  Prior Living Arrangements/Services Patient language and need for interpreter reviewed:: Yes Do you feel safe going back to the place where you live?: Yes      Need for Family Participation in Patient Care: No (Comment) Care giver support system in place?: Yes (comment) Criminal Activity/Legal Involvement Pertinent to Current Situation/Hospitalization: No - Comment as needed  Activities of Daily Living Home Assistive Devices/Equipment: Eyeglasses ADL Screening (condition at time of admission) Patient's cognitive ability adequate to safely complete daily activities?: Yes Is the patient deaf or have difficulty hearing?: No Does the patient have difficulty seeing, even when wearing glasses/contacts?: No Does the patient have difficulty concentrating, remembering, or making decisions?: No Patient able to express need for assistance with ADLs?: Yes Does the patient have difficulty dressing or bathing?: No Independently performs ADLs?: Yes (appropriate for developmental age) Does the patient have difficulty walking or climbing stairs?: No Weakness of Legs: Left Weakness of Arms/Hands: None  Emotional Assessment Appearance:: Appears  stated age Attitude/Demeanor/Rapport: Engaged Affect (typically observed): Appropriate Orientation: : Oriented to Self, Oriented to Place, Oriented to  Time, Oriented to Situation Alcohol / Substance Use: Alcohol Use Psych Involvement: No (comment)  Admission diagnosis:  Hip fracture (HCC) [S72.009A] Closed fracture of left hip, initial encounter (HCC) [S72.002A] Alcoholic intoxication without complication (HCC) [F10.920] Patient Active Problem List   Diagnosis Date Noted   Intertrochanteric fracture of left femur, closed, initial encounter (HCC) 09/06/2022   Hip fracture (HCC) 09/06/2022   Esophageal stricture 02/05/2022   Gastroesophageal reflux disease with esophagitis without hemorrhage 02/05/2022   Other chronic pancreatitis (HCC) 01/31/2022   Pancreatic cyst 01/31/2022   Hepatitis C test positive 10/02/2021   Encounter for screening for lung cancer 10/02/2021   Chronic viral hepatitis C (HCC) 10/02/2021   Tobacco use disorder 10/02/2021   Mixed hyperlipidemia 09/25/2021   Hyponatremia 05/22/2020   Essential hypertension, benign 05/22/2020   COPD (chronic obstructive pulmonary disease) (HCC) 04/13/2011   Tobacco abuse 03/15/2011   Long term (current) use of anticoagulants 03/15/2011   DVT, lower extremity (HCC) 03/15/2011   PCP:  [name], MD Pharmacy:   [facility] [address] [place] [address] [place] [address] Phone: [phone] Fax: [phone]  [place] [facility] [address] [place] [address] Phone: [phone] Fax: [phone]  Social Determinants of Health (SDOH) Social History: SDOH Screenings   Food Insecurity: No Food Insecurity (09/06/2022)  Housing: Low Risk  (09/06/2022)  Transportation Needs: No Transportation Needs (09/06/2022)  Utilities: Not At Risk (09/06/2022)  Depression (PHQ2-9): Low Risk  (01/31/2022)  Tobacco Use: High Risk (09/06/2022)   SDOH Interventions:     Readmission Risk Interventions     No data to display

## 2022-09-06 NOTE — ED Notes (Signed)
ED TO INPATIENT HANDOFF REPORT  S Name/Age/Gender Justin Suarez 67 y.o. male Room/Bed: WA15/WA15  Code Status   Code Status: Prior  Home/SNF/Other Home Patient oriented to: self, place, time, and situation Is this baseline? Yes   Triage Complete: Triage complete  Chief Complaint Hip fracture (HCC) [S72.009A]  Triage Note Pt BIBA after having a fall in an elevator  w/ etoh abuse onboard. Pt denies hitting head, landed on left hip. All sensations intact, color appropriate. No blood thinners.   Allergies No Known Allergies  Level of Care/Admitting Diagnosis ED Disposition     ED Disposition  Admit   Condition  --   Comment  Hospital Area: [facility] [[id number]]  Level of Care: Med-Surg [16]  May admit patient to [facility] or [facility] if equivalent level of care is available:: Yes  Covid Evaluation: Asymptomatic - no recent exposure (last 10 days) testing not required  Diagnosis: Hip fracture [facility]) [[id number]]  Admitting Physician: Gery Pray [[id number]]  Attending Physician: Gery Pray [phone]  Certification:: I certify this patient will need inpatient services for at least 2 midnights  Estimated Length of Stay: 2          B Medical/Surgery History Past Medical History:  Diagnosis Date   Abnormal CT scan, chest 06/2011   multiple lung nodules; needs repeat study in 3 to 12 months.   Arthritis    Hands   Chronic anticoagulation    Common bile duct dilation    COPD (chronic obstructive pulmonary disease) (HCC)    Pt denies   DVT (deep venous thrombosis) (HCC)    Gallstone pancreatitis    GERD (gastroesophageal reflux disease)    Hepatitis C    History of hiatal hernia    HTN (hypertension)    Noncompliance    Pancreatitis    Pancreatitis 06/2020   Pulmonary embolus (HCC)    Tick bite 07/2011   Tobacco abuse    Past Surgical History:  Procedure Laterality Date   ANKLE SURGERY Right 1998   3 surgeries.   CHOLECYSTECTOMY  N/A 05/24/2020   Procedure: LAPAROSCOPIC CHOLECYSTECTOMY WITH INTRAOPERATIVE CHOLANGIOGRAM;  Surgeon: Gaynelle Adu, MD;  Location: [facility];  Service: General;  Laterality: N/A;   ESOPHAGOGASTRODUODENOSCOPY (EGD) WITH PROPOFOL N/A 04/01/2020   Procedure: ESOPHAGOGASTRODUODENOSCOPY (EGD) WITH PROPOFOL;  Surgeon: Rachael Fee, MD;  Location: [facility];  Service: Endoscopy;  Laterality: N/A;   EUS N/A 04/01/2020   Procedure: UPPER ENDOSCOPIC ULTRASOUND (EUS) RADIAL;  Surgeon: Rachael Fee, MD;  Location: [facility];  Service: Endoscopy;  Laterality: N/A;   FINGER AMPUTATION Left 1970   first part of middle finger removed   FOOT SURGERY Right    x 3 from a fall   INGUINAL HERNIA REPAIR Right    years ago     A IV Location/Drains/Wounds Patient Lines/Drains/Airways Status     Active Line/Drains/Airways     Name Placement date Placement time Site Days   Peripheral IV 09/06/22 20 G Anterior;Right Forearm 09/06/22  0100  Forearm  less than 1   Peripheral IV 09/06/22 20 G 1" Left Antecubital 09/06/22  0207  Antecubital  less than 1   Incision (Closed) 05/24/20 Abdomen Other (Comment) 05/24/20  1412  -- 835   Incision - 4 Ports Abdomen Right;Lateral Right;Medial Mid;Medial Mid;Upper 05/24/20  1325  -- 835            Intake/Output Last 24 hours No intake or output data in the 24 hours ending  09/06/22 0405  Labs/Imaging Results for orders placed or performed during the hospital encounter of 09/06/22 (from the past 48 hour(s))  CBC with Differential     Status: Abnormal   Collection Time: 09/06/22  1:00 AM  Result Value Ref Range   WBC 7.8 4.0 - 10.5 K/uL   RBC 3.92 (L) 4.22 - 5.81 MIL/uL   Hemoglobin 12.5 (L) 13.0 - 17.0 g/dL   HCT 16.1 (L) 09.6 - 04.5 %   MCV 94.4 80.0 - 100.0 fL   MCH 31.9 26.0 - 34.0 pg   MCHC 33.8 30.0 - 36.0 g/dL   RDW 40.9 81.1 - 91.4 %   Platelets 213 150 - 400 K/uL   nRBC 0.0 0.0 - 0.2 %   Neutrophils Relative % 60 %   Neutro Abs 4.7 1.7 -  7.7 K/uL   Lymphocytes Relative 29 %   Lymphs Abs 2.3 0.7 - 4.0 K/uL   Monocytes Relative 8 %   Monocytes Absolute 0.6 0.1 - 1.0 K/uL   Eosinophils Relative 2 %   Eosinophils Absolute 0.1 0.0 - 0.5 K/uL   Basophils Relative 1 %   Basophils Absolute 0.1 0.0 - 0.1 K/uL   Immature Granulocytes 0 %   Abs Immature Granulocytes 0.03 0.00 - 0.07 K/uL    Comment: Performed at [facility], [address], [place], [place] [address]  Ethanol     Status: Abnormal   Collection Time: 09/06/22  1:00 AM  Result Value Ref Range   Alcohol, Ethyl (B) 241 (H) <10 mg/dL    Comment: (NOTE) Lowest detectable limit for serum alcohol is 10 mg/dL.  For medical purposes only. Performed at [facility], [address], [place], [place] [address]   I-stat chem 8, ED (not at [facility], [facility] or [facility])     Status: Abnormal   Collection Time: 09/06/22  1:42 AM  Result Value Ref Range   Sodium 134 (L) 135 - 145 mmol/L   Potassium 3.8 3.5 - 5.1 mmol/L   Chloride 97 (L) 98 - 111 mmol/L   BUN 4 (L) 8 - 23 mg/dL   Creatinine, Ser 8.65 0.61 - 1.24 mg/dL   Glucose, Bld 99 70 - 99 mg/dL    Comment: Glucose reference range applies only to samples taken after fasting for at least 8 hours.   Calcium, Ion 1.09 (L) 1.15 - 1.40 mmol/L   TCO2 25 22 - 32 mmol/L   Hemoglobin 12.6 (L) 13.0 - 17.0 g/dL   HCT 78.4 (L) 69.6 - 29.5 %  Protime-INR     Status: None   Collection Time: 09/06/22  2:10 AM  Result Value Ref Range   Prothrombin Time 13.7 11.4 - 15.2 seconds   INR 1.0 0.8 - 1.2    Comment: (NOTE) INR goal varies based on device and disease states. Performed at [facility], [address] [place], [place] [address]    DG Hip Lucienne Capers or Wo Pelvis 2-3 Views Left  Result Date: 09/06/2022 CLINICAL DATA:  Fall, left hip pain EXAM: DG HIP (WITH OR WITHOUT PELVIS) 2-3V LEFT COMPARISON:  None Available. FINDINGS: There is an acute, minimally displaced, anatomically  aligned intratrochanteric fracture of the left hip with fracture fragments in near anatomic alignment. Left femoral head is still seated within the left acetabulum. Pelvis and visualized proximal right femur are intact. Mild bilateral degenerative hip arthritis. Vascular calcifications noted. IMPRESSION: 1. Acute, minimally displaced, anatomically aligned intratrochanteric fracture of the left  hip. Electronically Signed   By: Helyn Numbers M.D.   On: 09/06/2022 01:30   DG Chest Portable 1 View  Result Date: 09/06/2022 CLINICAL DATA:  Fall, hip fracture, preoperative clearance for surgery. EXAM: PORTABLE CHEST 1 VIEW COMPARISON:  11/18/2020 FINDINGS: The heart size and mediastinal contours are within normal limits. Both lungs are clear. The visualized skeletal structures are unremarkable. IMPRESSION: No active disease. Electronically Signed   By: Helyn Numbers M.D.   On: 09/06/2022 01:28    Pending Labs Unresulted Labs (From admission, onward)     Start     Ordered   09/06/22 0332  Comprehensive metabolic panel  Once,   R        09/06/22 0331   09/06/22 0332  Magnesium  Once,   R        09/06/22 0331            Vitals/Pain Today's Vitals   09/06/22 0200 09/06/22 0230 09/06/22 0300 09/06/22 0355  BP: (!) 170/94 (!) 145/87 (!) 163/80 133/74  Pulse: 74 75 73 71  Resp:      Temp:      TempSrc:      SpO2: 97% 99% 96%   PainSc:        Isolation Precautions No active isolations  Medications Medications  morphine (PF) 2 MG/ML injection 2 mg (2 mg Intravenous Given 09/06/22 0357)  LORazepam (ATIVAN) tablet 1-4 mg (has no administration in time range)    Or  LORazepam (ATIVAN) injection 1-4 mg (has no administration in time range)  thiamine (VITAMIN B1) tablet 100 mg (has no administration in time range)    Or  thiamine (VITAMIN B1) injection 100 mg (has no administration in time range)  folic acid (FOLVITE) tablet 1 mg (has no administration in time range)  multivitamin with  minerals tablet 1 tablet (has no administration in time range)  nicotine (NICODERM CQ - dosed in mg/24 hours) patch 14 mg (has no administration in time range)  fentaNYL (SUBLIMAZE) injection 25 mcg (25 mcg Intravenous Given 09/06/22 0158)  ondansetron (ZOFRAN) injection 4 mg (4 mg Intravenous Given 09/06/22 0158)  fentaNYL (SUBLIMAZE) injection 50 mcg (50 mcg Intravenous Given 09/06/22 0240)    Mobility Walks at baseline but NWB to LLE      R  Report given to: Corrie Dandy

## 2022-09-06 NOTE — Plan of Care (Signed)
 Plan of care initiated and discussed. 

## 2022-09-06 NOTE — ED Triage Notes (Signed)
Pt BIBA after having a fall in an elevator  w/ etoh abuse onboard. Pt denies hitting head, landed on left hip. All sensations intact, color appropriate. No blood thinners.

## 2022-09-06 NOTE — Plan of Care (Signed)
   Problem: Activity: Goal: Risk for activity intolerance will decrease Outcome: Progressing   Problem: Pain Managment: Goal: General experience of comfort will improve Outcome: Progressing   

## 2022-09-06 NOTE — ED Provider Notes (Signed)
[place] EMERGENCY DEPARTMENT AT [facility] Provider Note   CSN: [id number] Arrival date & time: 09/06/22  0035     History  Chief Complaint  Patient presents with   Justin Suarez    Justin Suarez is a 67 y.o. male.  The history is provided by the patient.  Fall This is a new problem. The current episode started less than 1 hour ago. The problem occurs constantly. The problem has been resolved. Pertinent negatives include no chest pain, no abdominal pain, no headaches and no shortness of breath. Nothing aggravates the symptoms. Nothing relieves the symptoms. He has tried nothing for the symptoms. The treatment provided no relief.  Patient with a h/o DVT and noncompliance presents with fall onto left hip with pain.  Patient had been drinking [place] ETOH prior to fall.      Past Medical History:  Diagnosis Date   Abnormal CT scan, chest 06/2011   multiple lung nodules; needs repeat study in 3 to 12 months.   Arthritis    Hands   Chronic anticoagulation    Common bile duct dilation    COPD (chronic obstructive pulmonary disease) (HCC)    Pt denies   DVT (deep venous thrombosis) (HCC)    Gallstone pancreatitis    GERD (gastroesophageal reflux disease)    Hepatitis C    History of hiatal hernia    HTN (hypertension)    Noncompliance    Pancreatitis    Pancreatitis 06/2020   Pulmonary embolus (HCC)    Tick bite 07/2011   Tobacco abuse      Home Medications Prior to Admission medications   Medication Sig Start Date End Date Taking? Authorizing Provider  Cyanocobalamin (B-12 PO) Take 1 tablet by mouth daily.   Yes [provider]      Allergies    Patient has no known allergies.    Review of Systems   Review of Systems  Constitutional:  Negative for fever.  Respiratory:  Negative for shortness of breath.   Cardiovascular:  Negative for chest pain.  Gastrointestinal:  Negative for abdominal pain.  Musculoskeletal:  Positive for arthralgias.   Neurological:  Negative for headaches.    Physical Exam Updated Vital Signs BP (!) 163/80   Pulse 73   Temp 98.2 F (36.8 C) (Oral)   Resp 16   SpO2 96%  Physical Exam Vitals and nursing note reviewed. Exam conducted with a chaperone present.  Constitutional:      General: He is not in acute distress.    Appearance: He is well-developed. He is not diaphoretic.  HENT:     Head: Normocephalic and atraumatic.     Nose: Nose normal.  Eyes:     Extraocular Movements: Extraocular movements intact.     Conjunctiva/sclera: Conjunctivae normal.     Pupils: Pupils are equal, round, and reactive to light.  Cardiovascular:     Rate and Rhythm: Normal rate and regular rhythm.  Pulmonary:     Effort: Pulmonary effort is normal.     Breath sounds: Normal breath sounds. No wheezing or rales.  Abdominal:     General: Bowel sounds are normal.     Palpations: Abdomen is soft.     Tenderness: There is no abdominal tenderness. There is no guarding or rebound.  Musculoskeletal:     Cervical back: Normal range of motion and neck supple.     Left hip: Tenderness present.     Comments: Foreshortening and rotation of the LLE  Skin:    General: Skin is warm and dry.  Neurological:     General: No focal deficit present.     Mental Status: He is alert and oriented to person, place, and time.     Deep Tendon Reflexes: Reflexes normal.     ED Results / Procedures / Treatments   Labs (all labs ordered are listed, but only abnormal results are displayed) Results for orders placed or performed during the hospital encounter of 09/06/22  CBC with Differential  Result Value Ref Range   WBC 7.8 4.0 - 10.5 K/uL   RBC 3.92 (L) 4.22 - 5.81 MIL/uL   Hemoglobin 12.5 (L) 13.0 - 17.0 g/dL   HCT 16.1 (L) 09.6 - 04.5 %   MCV 94.4 80.0 - 100.0 fL   MCH 31.9 26.0 - 34.0 pg   MCHC 33.8 30.0 - 36.0 g/dL   RDW 40.9 81.1 - 91.4 %   Platelets 213 150 - 400 K/uL   nRBC 0.0 0.0 - 0.2 %   Neutrophils Relative  % 60 %   Neutro Abs 4.7 1.7 - 7.7 K/uL   Lymphocytes Relative 29 %   Lymphs Abs 2.3 0.7 - 4.0 K/uL   Monocytes Relative 8 %   Monocytes Absolute 0.6 0.1 - 1.0 K/uL   Eosinophils Relative 2 %   Eosinophils Absolute 0.1 0.0 - 0.5 K/uL   Basophils Relative 1 %   Basophils Absolute 0.1 0.0 - 0.1 K/uL   Immature Granulocytes 0 %   Abs Immature Granulocytes 0.03 0.00 - 0.07 K/uL  Protime-INR  Result Value Ref Range   Prothrombin Time 13.7 11.4 - 15.2 seconds   INR 1.0 0.8 - 1.2  Ethanol  Result Value Ref Range   Alcohol, Ethyl (B) 241 (H) <10 mg/dL  I-stat chem 8, ED (not at [facility], [facility] or [facility])  Result Value Ref Range   Sodium 134 (L) 135 - 145 mmol/L   Potassium 3.8 3.5 - 5.1 mmol/L   Chloride 97 (L) 98 - 111 mmol/L   BUN 4 (L) 8 - 23 mg/dL   Creatinine, Ser 7.82 0.61 - 1.24 mg/dL   Glucose, Bld 99 70 - 99 mg/dL   Calcium, Ion 9.56 (L) 1.15 - 1.40 mmol/L   TCO2 25 22 - 32 mmol/L   Hemoglobin 12.6 (L) 13.0 - 17.0 g/dL   HCT 21.3 (L) 08.6 - 57.8 %   DG Hip Unilat W or Wo Pelvis 2-3 Views Left  Result Date: 09/06/2022 CLINICAL DATA:  Fall, left hip pain EXAM: DG HIP (WITH OR WITHOUT PELVIS) 2-3V LEFT COMPARISON:  None Available. FINDINGS: There is an acute, minimally displaced, anatomically aligned intratrochanteric fracture of the left hip with fracture fragments in near anatomic alignment. Left femoral head is still seated within the left acetabulum. Pelvis and visualized proximal right femur are intact. Mild bilateral degenerative hip arthritis. Vascular calcifications noted. IMPRESSION: 1. Acute, minimally displaced, anatomically aligned intratrochanteric fracture of the left hip. Electronically Signed   By: Helyn Numbers M.D.   On: 09/06/2022 01:30   DG Chest Portable 1 View  Result Date: 09/06/2022 CLINICAL DATA:  Fall, hip fracture, preoperative clearance for surgery. EXAM: PORTABLE CHEST 1 VIEW COMPARISON:  11/18/2020 FINDINGS: The heart size and mediastinal contours are within  normal limits. Both lungs are clear. The visualized skeletal structures are unremarkable. IMPRESSION: No active disease. Electronically Signed   By: Helyn Numbers M.D.   On: 09/06/2022 01:28     Radiology DG Hip Unilat W  or Wo Pelvis 2-3 Views Left  Result Date: 09/06/2022 CLINICAL DATA:  Fall, left hip pain EXAM: DG HIP (WITH OR WITHOUT PELVIS) 2-3V LEFT COMPARISON:  None Available. FINDINGS: There is an acute, minimally displaced, anatomically aligned intratrochanteric fracture of the left hip with fracture fragments in near anatomic alignment. Left femoral head is still seated within the left acetabulum. Pelvis and visualized proximal right femur are intact. Mild bilateral degenerative hip arthritis. Vascular calcifications noted. IMPRESSION: 1. Acute, minimally displaced, anatomically aligned intratrochanteric fracture of the left hip. Electronically Signed   By: Helyn Numbers M.D.   On: 09/06/2022 01:30   DG Chest Portable 1 View  Result Date: 09/06/2022 CLINICAL DATA:  Fall, hip fracture, preoperative clearance for surgery. EXAM: PORTABLE CHEST 1 VIEW COMPARISON:  11/18/2020 FINDINGS: The heart size and mediastinal contours are within normal limits. Both lungs are clear. The visualized skeletal structures are unremarkable. IMPRESSION: No active disease. Electronically Signed   By: Helyn Numbers M.D.   On: 09/06/2022 01:28    Procedures Procedures    Medications Ordered in ED Medications  morphine (PF) 2 MG/ML injection 2 mg (has no administration in time range)  LORazepam (ATIVAN) tablet 1-4 mg (has no administration in time range)    Or  LORazepam (ATIVAN) injection 1-4 mg (has no administration in time range)  thiamine (VITAMIN B1) tablet 100 mg (has no administration in time range)    Or  thiamine (VITAMIN B1) injection 100 mg (has no administration in time range)  folic acid (FOLVITE) tablet 1 mg (has no administration in time range)  multivitamin with minerals tablet 1 tablet  (has no administration in time range)  nicotine (NICODERM CQ - dosed in mg/24 hours) patch 14 mg (has no administration in time range)  fentaNYL (SUBLIMAZE) injection 25 mcg (25 mcg Intravenous Given 09/06/22 0158)  ondansetron (ZOFRAN) injection 4 mg (4 mg Intravenous Given 09/06/22 0158)  fentaNYL (SUBLIMAZE) injection 50 mcg (50 mcg Intravenous Given 09/06/22 0240)    ED Course/ Medical Decision Making/ A&P                             Medical Decision Making Fall onto L hip while drinking ETOH   Amount and/or Complexity of Data Reviewed Independent Historian: EMS    Details: See above  External Data Reviewed: notes.    Details: Previous notes reviewed  Labs: ordered.    Details: Normal white count 7.8, hemoglobin slight low 12.5, normal platelets. Normal coagulation studies.  ETOH elevated 241 Radiology: ordered and independent interpretation performed.    Details: Intertrochanteric hip fracture left by me normal cxr by me  Discussion of management or test interpretation with external provider(s): Case d/w Dr. Yevette Edwards by phone, admit to medicine and keep NPO  Risk Prescription drug management. Decision regarding hospitalization. Risk Details: Will admit to medicine for surgical repair     Final Clinical Impression(s) / ED Diagnoses Final diagnoses:  Closed fracture of left hip, initial encounter [facility])  Alcoholic intoxication without complication (HCC)   The patient appears reasonably stabilized for admission considering the current resources, flow, and capabilities available in the ED at this time, and I doubt any other [facility] requiring further screening and/or treatment in the ED prior to admission.  Rx / DC Orders ED Discharge Orders     None         Phallon Haydu, MD 09/06/22 1610

## 2022-09-06 NOTE — Progress Notes (Signed)
I was contacted earlier this morning regarding patient's left intertrochanteric fracture.  He has since been admitted to the medicine service.  I have reviewed x-rays.  He is going to need operative intervention for his fracture.  Currently, there is no availability in the operating room, nor is there any surgeon availability.  Patient may be allowed to eat today, and should be made n.p.o. after midnight tonight, for likely operative intervention tomorrow.  He should not put weight on the left lower extremity in the meantime.  More precise information such as the specific timing of surgery, and the surgeon performing the procedure, will be forthcoming.

## 2022-09-06 NOTE — H&P (Signed)
PCP:   [name], MD   Chief Complaint:  Fall  HPI: This is a 67 year old male with past medical history of COPD, DVT/PE, hep C, recurrent alcoholic pancreatitis, HTN and alcohol abuse.  Today while stepping in the elevator, he made a turn and went down.  He hit the floor on his left hip.  His left arm got caught inside of the rail.  Per patient he his head never hit the floor he had no loss of consciousness.  At baseline the patient drinks 4-5 beers daily, today he was drinking crown royal. He describes himself as a functional alcoholic.  In the ER x-ray of the left hip shows acute, minimally displaced, anatomically aligned intertrochanteric fracture of the left hip.  Alcohol level 241.  Vitals stable  Review of Systems:  Per HPI  Past Medical History: Past Medical History:  Diagnosis Date   Abnormal CT scan, chest 06/2011   multiple lung nodules; needs repeat study in 3 to 12 months.   Arthritis    Hands   Chronic anticoagulation    Common bile duct dilation    COPD (chronic obstructive pulmonary disease) (HCC)    Pt denies   DVT (deep venous thrombosis) (HCC)    Gallstone pancreatitis    GERD (gastroesophageal reflux disease)    Hepatitis C    History of hiatal hernia    HTN (hypertension)    Noncompliance    Pancreatitis    Pancreatitis 06/2020   Pulmonary embolus (HCC)    Tick bite 07/2011   Tobacco abuse    Past Surgical History:  Procedure Laterality Date   ANKLE SURGERY Right 1998   3 surgeries.   CHOLECYSTECTOMY N/A 05/24/2020   Procedure: LAPAROSCOPIC CHOLECYSTECTOMY WITH INTRAOPERATIVE CHOLANGIOGRAM;  Surgeon: Gaynelle Adu, MD;  Location: [facility];  Service: General;  Laterality: N/A;   ESOPHAGOGASTRODUODENOSCOPY (EGD) WITH PROPOFOL N/A 04/01/2020   Procedure: ESOPHAGOGASTRODUODENOSCOPY (EGD) WITH PROPOFOL;  Surgeon: Rachael Fee, MD;  Location: [facility];  Service: Endoscopy;  Laterality: N/A;   EUS N/A 04/01/2020   Procedure: UPPER ENDOSCOPIC  ULTRASOUND (EUS) RADIAL;  Surgeon: Rachael Fee, MD;  Location: [facility];  Service: Endoscopy;  Laterality: N/A;   FINGER AMPUTATION Left 1970   first part of middle finger removed   FOOT SURGERY Right    x 3 from a fall   INGUINAL HERNIA REPAIR Right    years ago    Medications: Prior to Admission medications   Medication Sig Start Date End Date Taking? Authorizing Provider  Cyanocobalamin (B-12 PO) Take 1 tablet by mouth daily.   Yes [provider]    Allergies:  No Known Allergies  Social History:  reports that he has been smoking cigarettes. He has a 10.8 pack-year smoking history. He has never used smokeless tobacco. He reports current alcohol use. He reports that he does not use drugs.  Family History: Family History  Problem Relation Age of Onset   Kidney disease Mother        ESRD dialysis   Diabetes Mother    Heart disease Mother        CHF   Heart failure Mother    Heart disease Father    Heart disease Sister 30       CABG   Coronary artery disease Sister    Coronary artery disease Sister    Heart disease Sister    Cerebral aneurysm Sister    Atrial fibrillation Brother    Hypertension Brother  Esophageal cancer Neg Hx    Colon cancer Neg Hx    Rectal cancer Neg Hx    Stomach cancer Neg Hx     Physical Exam: Vitals:   09/06/22 0043 09/06/22 0044  BP: (!) 167/96   Pulse: 69   Resp: 16   Temp: 98.2 F (36.8 C)   TempSrc: Oral   SpO2: 100% 97%    General:  A&O x3, well developed and nourished, no acute distress Eyes: Pink conjunctiva, no scleral icterus ENT: Moist oral mucosa, neck supple, no thyromegaly Lungs: CTA B/L, no use of accessory muscles Cardiovascular: RRR,  no murmurs. No carotid bruits, no JVD Abdomen: soft, positive BS, NTND, no organomegaly, not an acute abdomen GU: not examined Neuro: CN II - XII grossly intact, sensation intact Musculoskeletal: strength 5/5 all extremities except LLE, no clubbing, cyanosis or  edema Skin: no rash, no subcutaneous crepitation, no decubitus Psych: appropriate patient   Labs on Admission:  Recent Labs    09/06/22 0142  NA 134*  K 3.8  CL 97*  GLUCOSE 99  BUN 4*  CREATININE 1.00    Recent Labs    09/06/22 0100 09/06/22 0142  WBC 7.8  --   NEUTROABS 4.7  --   HGB 12.5* 12.6*  HCT 37.0* 37.0*  MCV 94.4  --   PLT 213  --      Radiological Exams on Admission: DG Hip Unilat W or Wo Pelvis 2-3 Views Left  Result Date: 09/06/2022 CLINICAL DATA:  Fall, left hip pain EXAM: DG HIP (WITH OR WITHOUT PELVIS) 2-3V LEFT COMPARISON:  None Available. FINDINGS: There is an acute, minimally displaced, anatomically aligned intratrochanteric fracture of the left hip with fracture fragments in near anatomic alignment. Left femoral head is still seated within the left acetabulum. Pelvis and visualized proximal right femur are intact. Mild bilateral degenerative hip arthritis. Vascular calcifications noted. IMPRESSION: 1. Acute, minimally displaced, anatomically aligned intratrochanteric fracture of the left hip. Electronically Signed   By: Helyn Numbers M.D.   On: 09/06/2022 01:30   DG Chest Portable 1 View  Result Date: 09/06/2022 CLINICAL DATA:  Fall, hip fracture, preoperative clearance for surgery. EXAM: PORTABLE CHEST 1 VIEW COMPARISON:  11/18/2020 FINDINGS: The heart size and mediastinal contours are within normal limits. Both lungs are clear. The visualized skeletal structures are unremarkable. IMPRESSION: No active disease. Electronically Signed   By: Helyn Numbers M.D.   On: 09/06/2022 01:28    Assessment/Plan Present on Admission:  Left intertrochanteric fracture, initial encounter -N.p.o., IV fluid hydration -Orthopedics on-call aware, recommend n.p.o. will see patient in a.m. -Pain medications as needed -Antiemetic as needed   Alcohol abuse/intoxication -CIWA protocol -Thiamine, folate, multivitamin   Tobacco abuse -Nicotine patch   Essential  hypertension, benign -As needed blood pressure medication   History of PE/DVT  COPD (chronic obstructive pulmonary disease) (HCC)  Mixed hyperlipidemia -Compliant with home medications   Hepatitis C test positive  Homelessness  Justin Suarez 09/06/2022, 3:02 AM

## 2022-09-07 ENCOUNTER — Encounter (HOSPITAL_COMMUNITY)
Admission: EM | Disposition: A | Discharge status: Home Health | Payer: Self-pay | Source: Home / Self Care | Attending: Family Medicine

## 2022-09-07 ENCOUNTER — Other Ambulatory Visit: Payer: Self-pay

## 2022-09-07 ENCOUNTER — Inpatient Hospital Stay (HOSPITAL_COMMUNITY): Payer: 59 | Admitting: Anesthesiology

## 2022-09-07 ENCOUNTER — Encounter (HOSPITAL_COMMUNITY): Payer: Self-pay | Admitting: Family Medicine

## 2022-09-07 ENCOUNTER — Inpatient Hospital Stay (HOSPITAL_COMMUNITY): Payer: 59

## 2022-09-07 DIAGNOSIS — S72001A Fracture of unspecified part of neck of right femur, initial encounter for closed fracture: Secondary | ICD-10-CM | POA: Diagnosis not present

## 2022-09-07 DIAGNOSIS — S72142A Displaced intertrochanteric fracture of left femur, initial encounter for closed fracture: Secondary | ICD-10-CM

## 2022-09-07 DIAGNOSIS — J449 Chronic obstructive pulmonary disease, unspecified: Secondary | ICD-10-CM

## 2022-09-07 DIAGNOSIS — I10 Essential (primary) hypertension: Secondary | ICD-10-CM

## 2022-09-07 DIAGNOSIS — F17210 Nicotine dependence, cigarettes, uncomplicated: Secondary | ICD-10-CM

## 2022-09-07 HISTORY — PX: INTRAMEDULLARY (IM) NAIL INTERTROCHANTERIC: SHX5875

## 2022-09-07 LAB — PROTIME-INR
INR: 1.1 (ref 0.8–1.2)
Prothrombin Time: 14.6 seconds (ref 11.4–15.2)

## 2022-09-07 LAB — CBC WITH DIFFERENTIAL/PLATELET
Abs Immature Granulocytes: 0.01 10*3/uL (ref 0.00–0.07)
Basophils Absolute: 0 10*3/uL (ref 0.0–0.1)
Basophils Relative: 1 %
Eosinophils Absolute: 0.1 10*3/uL (ref 0.0–0.5)
Eosinophils Relative: 3 %
HCT: 35.4 % — ABNORMAL LOW (ref 39.0–52.0)
Hemoglobin: 11.8 g/dL — ABNORMAL LOW (ref 13.0–17.0)
Immature Granulocytes: 0 %
Lymphocytes Relative: 37 %
Lymphs Abs: 1.4 10*3/uL (ref 0.7–4.0)
MCH: 31.8 pg (ref 26.0–34.0)
MCHC: 33.3 g/dL (ref 30.0–36.0)
MCV: 95.4 fL (ref 80.0–100.0)
Monocytes Absolute: 0.5 10*3/uL (ref 0.1–1.0)
Monocytes Relative: 12 %
Neutro Abs: 1.8 10*3/uL (ref 1.7–7.7)
Neutrophils Relative %: 47 %
Platelets: 153 10*3/uL (ref 150–400)
RBC: 3.71 MIL/uL — ABNORMAL LOW (ref 4.22–5.81)
RDW: 13.5 % (ref 11.5–15.5)
WBC: 3.9 10*3/uL — ABNORMAL LOW (ref 4.0–10.5)
nRBC: 0 % (ref 0.0–0.2)

## 2022-09-07 LAB — BASIC METABOLIC PANEL
Anion gap: 5 (ref 5–15)
BUN: 13 mg/dL (ref 8–23)
CO2: 29 mmol/L (ref 22–32)
Calcium: 8.4 mg/dL — ABNORMAL LOW (ref 8.9–10.3)
Chloride: 101 mmol/L (ref 98–111)
Creatinine, Ser: 0.83 mg/dL (ref 0.61–1.24)
GFR, Estimated: >60 mL/min (ref >60)
Glucose, Bld: 109 mg/dL — ABNORMAL HIGH (ref 70–99)
Potassium: 4.6 mmol/L (ref 3.5–5.1)
Sodium: 135 mmol/L (ref 135–145)

## 2022-09-07 SURGERY — FIXATION, FRACTURE, INTERTROCHANTERIC, WITH INTRAMEDULLARY ROD
Anesthesia: General | Site: Hip | Laterality: Left

## 2022-09-07 MED ORDER — DEXAMETHASONE SODIUM PHOSPHATE 10 MG/ML IJ SOLN
INTRAMUSCULAR | Status: DC | PRN
  Administered 2022-09-07: 8 mg via INTRAVENOUS

## 2022-09-07 MED ORDER — 0.9 % SODIUM CHLORIDE (POUR BTL) OPTIME
TOPICAL | Status: DC | PRN
  Administered 2022-09-07: 1000 mL

## 2022-09-07 MED ORDER — CHLORHEXIDINE GLUCONATE 4 % EX SOLN: 60.0000 mL | Freq: Once | CUTANEOUS | Status: DC

## 2022-09-07 MED ORDER — KETAMINE HCL 50 MG/5ML IJ SOSY
PREFILLED_SYRINGE | INTRAMUSCULAR | Status: AC
  Filled 2022-09-07: qty 5

## 2022-09-07 MED ORDER — FENTANYL CITRATE PF 50 MCG/ML IJ SOSY
PREFILLED_SYRINGE | INTRAMUSCULAR | Status: AC
  Administered 2022-09-07: 50 ug via INTRAVENOUS
  Filled 2022-09-07: qty 3

## 2022-09-07 MED ORDER — DEXAMETHASONE SODIUM PHOSPHATE 10 MG/ML IJ SOLN
INTRAMUSCULAR | Status: AC
  Filled 2022-09-07: qty 1

## 2022-09-07 MED ORDER — ONDANSETRON HCL 4 MG PO TABS: 4.0000 mg | ORAL_TABLET | Freq: Four times a day (QID) | ORAL | Status: DC | PRN

## 2022-09-07 MED ORDER — TRANEXAMIC ACID-NACL 1000-0.7 MG/100ML-% IV SOLN
1000.0000 mg | Freq: Once | INTRAVENOUS | Status: AC
  Administered 2022-09-07: 1000 mg via INTRAVENOUS
  Filled 2022-09-07: qty 100

## 2022-09-07 MED ORDER — KETOROLAC TROMETHAMINE 30 MG/ML IJ SOLN
INTRAMUSCULAR | Status: AC
  Filled 2022-09-07: qty 1

## 2022-09-07 MED ORDER — PHENOL 1.4 % MT LIQD: 1.0000 | OROMUCOSAL | Status: DC | PRN

## 2022-09-07 MED ORDER — LIDOCAINE HCL (PF) 2 % IJ SOLN
INTRAMUSCULAR | Status: AC
  Filled 2022-09-07: qty 5

## 2022-09-07 MED ORDER — ACETAMINOPHEN 500 MG PO TABS
1000.0000 mg | ORAL_TABLET | Freq: Once | ORAL | Status: AC
  Administered 2022-09-07: 1000 mg via ORAL
  Filled 2022-09-07: qty 2

## 2022-09-07 MED ORDER — KETOROLAC TROMETHAMINE 30 MG/ML IJ SOLN
INTRAMUSCULAR | Status: DC | PRN
  Administered 2022-09-07: 30 mg via INTRAVENOUS

## 2022-09-07 MED ORDER — HYDRALAZINE HCL 20 MG/ML IJ SOLN
INTRAMUSCULAR | Status: AC
  Filled 2022-09-07: qty 1

## 2022-09-07 MED ORDER — BISACODYL 10 MG RE SUPP: 10.0000 mg | Freq: Every day | RECTAL | Status: DC | PRN

## 2022-09-07 MED ORDER — ONDANSETRON HCL 4 MG/2ML IJ SOLN
INTRAMUSCULAR | Status: DC | PRN
  Administered 2022-09-07: 4 mg via INTRAVENOUS

## 2022-09-07 MED ORDER — OXYCODONE HCL 5 MG PO TABS: 10.0000 mg | ORAL_TABLET | ORAL | Status: DC | PRN

## 2022-09-07 MED ORDER — METOCLOPRAMIDE HCL 5 MG/ML IJ SOLN: 5.0000 mg | Freq: Three times a day (TID) | INTRAMUSCULAR | Status: DC | PRN

## 2022-09-07 MED ORDER — FENTANYL CITRATE (PF) 100 MCG/2ML IJ SOLN
INTRAMUSCULAR | Status: AC
  Filled 2022-09-07: qty 2

## 2022-09-07 MED ORDER — LACTATED RINGERS IV SOLN: INTRAVENOUS | Status: DC

## 2022-09-07 MED ORDER — METHOCARBAMOL 500 MG PO TABS
500.0000 mg | ORAL_TABLET | Freq: Four times a day (QID) | ORAL | Status: DC | PRN
  Administered 2022-09-07: 500 mg via ORAL
  Filled 2022-09-07: qty 1

## 2022-09-07 MED ORDER — METOPROLOL TARTRATE 12.5 MG HALF TABLET
12.5000 mg | ORAL_TABLET | Freq: Two times a day (BID) | ORAL | Status: DC
  Administered 2022-09-07 – 2022-09-08 (×2): 12.5 mg via ORAL
  Filled 2022-09-07 (×2): qty 1

## 2022-09-07 MED ORDER — POLYETHYLENE GLYCOL 3350 17 G PO PACK
17.0000 g | PACK | Freq: Two times a day (BID) | ORAL | Status: DC
  Administered 2022-09-07 – 2022-09-08 (×2): 17 g via ORAL
  Filled 2022-09-07 (×2): qty 1

## 2022-09-07 MED ORDER — LIDOCAINE HCL (CARDIAC) PF 100 MG/5ML IV SOSY
PREFILLED_SYRINGE | INTRAVENOUS | Status: DC | PRN
  Administered 2022-09-07: 80 mg via INTRAVENOUS

## 2022-09-07 MED ORDER — DEXAMETHASONE SODIUM PHOSPHATE 10 MG/ML IJ SOLN
10.0000 mg | Freq: Once | INTRAMUSCULAR | Status: AC
  Administered 2022-09-08: 10 mg via INTRAVENOUS
  Filled 2022-09-07: qty 1

## 2022-09-07 MED ORDER — ONDANSETRON HCL 4 MG/2ML IJ SOLN: 4.0000 mg | Freq: Once | INTRAMUSCULAR | Status: DC | PRN

## 2022-09-07 MED ORDER — PROPOFOL 10 MG/ML IV BOLUS
INTRAVENOUS | Status: DC | PRN
  Administered 2022-09-07: 150 mg via INTRAVENOUS

## 2022-09-07 MED ORDER — METHOCARBAMOL 500 MG IVPB - SIMPLE MED: 500.0000 mg | Freq: Four times a day (QID) | INTRAVENOUS | Status: DC | PRN

## 2022-09-07 MED ORDER — ONDANSETRON HCL 4 MG/2ML IJ SOLN
INTRAMUSCULAR | Status: AC
  Filled 2022-09-07: qty 2

## 2022-09-07 MED ORDER — SODIUM CHLORIDE 0.9 % IV SOLN: INTRAVENOUS | Status: DC

## 2022-09-07 MED ORDER — MENTHOL 3 MG MT LOZG: 1.0000 | LOZENGE | OROMUCOSAL | Status: DC | PRN

## 2022-09-07 MED ORDER — SUGAMMADEX SODIUM 200 MG/2ML IV SOLN
INTRAVENOUS | Status: DC | PRN
  Administered 2022-09-07 (×2): 100 mg via INTRAVENOUS

## 2022-09-07 MED ORDER — ROCURONIUM BROMIDE 10 MG/ML (PF) SYRINGE
PREFILLED_SYRINGE | INTRAVENOUS | Status: AC
  Filled 2022-09-07: qty 10

## 2022-09-07 MED ORDER — HYDRALAZINE HCL 20 MG/ML IJ SOLN
10.0000 mg | Freq: Once | INTRAMUSCULAR | Status: AC
  Administered 2022-09-07: 10 mg via INTRAVENOUS

## 2022-09-07 MED ORDER — TRANEXAMIC ACID-NACL 1000-0.7 MG/100ML-% IV SOLN
1000.0000 mg | INTRAVENOUS | Status: AC
  Administered 2022-09-07: 1000 mg via INTRAVENOUS
  Filled 2022-09-07: qty 100

## 2022-09-07 MED ORDER — AMISULPRIDE (ANTIEMETIC) 5 MG/2ML IV SOLN: 10.0000 mg | Freq: Once | INTRAVENOUS | Status: DC | PRN

## 2022-09-07 MED ORDER — RIVAROXABAN 10 MG PO TABS
10.0000 mg | ORAL_TABLET | Freq: Every day | ORAL | Status: DC
  Administered 2022-09-08: 10 mg via ORAL
  Filled 2022-09-07: qty 1

## 2022-09-07 MED ORDER — DOCUSATE SODIUM 100 MG PO CAPS
100.0000 mg | ORAL_CAPSULE | Freq: Two times a day (BID) | ORAL | Status: DC
  Administered 2022-09-07 – 2022-09-08 (×2): 100 mg via ORAL
  Filled 2022-09-07 (×2): qty 1

## 2022-09-07 MED ORDER — PROPOFOL 10 MG/ML IV BOLUS
INTRAVENOUS | Status: AC
  Filled 2022-09-07: qty 20

## 2022-09-07 MED ORDER — ACETAMINOPHEN 325 MG PO TABS
650.0000 mg | ORAL_TABLET | Freq: Four times a day (QID) | ORAL | Status: DC
  Administered 2022-09-07 – 2022-09-08 (×4): 650 mg via ORAL
  Filled 2022-09-07 (×4): qty 2

## 2022-09-07 MED ORDER — HYDROMORPHONE HCL 1 MG/ML IJ SOLN: 0.5000 mg | INTRAMUSCULAR | Status: DC | PRN

## 2022-09-07 MED ORDER — OXYCODONE HCL 5 MG PO TABS
5.0000 mg | ORAL_TABLET | ORAL | Status: DC | PRN
  Administered 2022-09-07 (×2): 5 mg via ORAL
  Filled 2022-09-07 (×2): qty 1

## 2022-09-07 MED ORDER — CEFAZOLIN SODIUM-DEXTROSE 2-4 GM/100ML-% IV SOLN
2.0000 g | Freq: Four times a day (QID) | INTRAVENOUS | Status: AC
  Administered 2022-09-07 – 2022-09-08 (×2): 2 g via INTRAVENOUS
  Filled 2022-09-07 (×2): qty 100

## 2022-09-07 MED ORDER — CEFAZOLIN SODIUM-DEXTROSE 2-4 GM/100ML-% IV SOLN
2.0000 g | INTRAVENOUS | Status: AC
  Administered 2022-09-07: 2 g via INTRAVENOUS
  Filled 2022-09-07: qty 100

## 2022-09-07 MED ORDER — FENTANYL CITRATE PF 50 MCG/ML IJ SOSY
25.0000 ug | PREFILLED_SYRINGE | INTRAMUSCULAR | Status: DC | PRN
  Administered 2022-09-07: 50 ug via INTRAVENOUS

## 2022-09-07 MED ORDER — PHENYLEPHRINE 80 MCG/ML (10ML) SYRINGE FOR IV PUSH (FOR BLOOD PRESSURE SUPPORT)
PREFILLED_SYRINGE | INTRAVENOUS | Status: DC | PRN
  Administered 2022-09-07: 240 ug via INTRAVENOUS

## 2022-09-07 MED ORDER — POVIDONE-IODINE 10 % EX SWAB: 2.0000 | Freq: Once | CUTANEOUS | Status: DC

## 2022-09-07 MED ORDER — FENTANYL CITRATE (PF) 100 MCG/2ML IJ SOLN
INTRAMUSCULAR | Status: DC | PRN
  Administered 2022-09-07 (×2): 50 ug via INTRAVENOUS

## 2022-09-07 MED ORDER — METOCLOPRAMIDE HCL 5 MG PO TABS: 5.0000 mg | ORAL_TABLET | Freq: Three times a day (TID) | ORAL | Status: DC | PRN

## 2022-09-07 MED ORDER — DIPHENHYDRAMINE HCL 12.5 MG/5ML PO ELIX: 12.5000 mg | ORAL_SOLUTION | ORAL | Status: DC | PRN

## 2022-09-07 MED ORDER — ONDANSETRON HCL 4 MG/2ML IJ SOLN: 4.0000 mg | Freq: Four times a day (QID) | INTRAMUSCULAR | Status: DC | PRN

## 2022-09-07 MED ORDER — CHLORHEXIDINE GLUCONATE 0.12 % MT SOLN: 15.0000 mL | Freq: Once | OROMUCOSAL | Status: DC

## 2022-09-07 MED ORDER — ROCURONIUM BROMIDE 100 MG/10ML IV SOLN
INTRAVENOUS | Status: DC | PRN
  Administered 2022-09-07: 60 mg via INTRAVENOUS

## 2022-09-07 SURGICAL SUPPLY — 41 items
ADH SKN CLS APL DERMABOND .7 (GAUZE/BANDAGES/DRESSINGS) ×1
BAG COUNTER SPONGE SURGICOUNT (BAG) IMPLANT
BAG SPEC THK2 15X12 ZIP CLS (MISCELLANEOUS) ×1
BAG SPNG CNTER NS LX DISP (BAG)
BAG ZIPLOCK 12X15 (MISCELLANEOUS) ×1 IMPLANT
BIT DRILL CANN LG 4.3MM (BIT) IMPLANT
COVER PERINEAL POST (MISCELLANEOUS) ×1 IMPLANT
COVER SURGICAL LIGHT HANDLE (MISCELLANEOUS) ×1 IMPLANT
DERMABOND ADVANCED .7 DNX12 (GAUZE/BANDAGES/DRESSINGS) ×1 IMPLANT
DRAPE STERI IOBAN 125X83 (DRAPES) ×1 IMPLANT
DRESSING AQUACEL AG SP 3.5X4 (GAUZE/BANDAGES/DRESSINGS) IMPLANT
DRILL BIT CANN LG 4.3MM (BIT) ×1
DRSG AQUACEL AG ADV 3.5X 6 (GAUZE/BANDAGES/DRESSINGS) ×2 IMPLANT
DRSG AQUACEL AG SP 3.5X4 (GAUZE/BANDAGES/DRESSINGS) ×2
DURAPREP 26ML APPLICATOR (WOUND CARE) ×1 IMPLANT
ELECT REM PT RETURN 15FT ADLT (MISCELLANEOUS) IMPLANT
FACESHIELD WRAPAROUND (MASK) ×1 IMPLANT
FACESHIELD WRAPAROUND OR TEAM (MASK) ×1 IMPLANT
GLOVE BIO SURGEON STRL SZ 6 (GLOVE) ×1 IMPLANT
GLOVE BIOGEL PI IND STRL 6.5 (GLOVE) ×1 IMPLANT
GLOVE BIOGEL PI IND STRL 7.5 (GLOVE) ×2 IMPLANT
GLOVE ORTHO TXT STRL SZ7.5 (GLOVE) ×1 IMPLANT
GOWN STRL REUS W/ TWL LRG LVL3 (GOWN DISPOSABLE) ×2 IMPLANT
GOWN STRL REUS W/TWL LRG LVL3 (GOWN DISPOSABLE) ×2
GUIDEPIN VERSANAIL DSP 3.2X444 (ORTHOPEDIC DISPOSABLE SUPPLIES) IMPLANT
KIT BASIN OR (CUSTOM PROCEDURE TRAY) ×1 IMPLANT
KIT TURNOVER KIT A (KITS) IMPLANT
MANIFOLD NEPTUNE II (INSTRUMENTS) ×1 IMPLANT
NAIL HIP FRACT 130D 11X180 (Screw) IMPLANT
PACK GENERAL/GYN (CUSTOM PROCEDURE TRAY) ×1 IMPLANT
PROTECTOR NERVE ULNAR (MISCELLANEOUS) ×1 IMPLANT
SCREW BONE CORTICAL 5.0X42 (Screw) IMPLANT
SCREW LAG 10.5MMX105MM HFN (Screw) IMPLANT
STRIP CLOSURE SKIN 1/2X4 (GAUZE/BANDAGES/DRESSINGS) IMPLANT
SUT MNCRL AB 4-0 PS2 18 (SUTURE) ×1 IMPLANT
SUT VIC AB 1 CT1 27 (SUTURE) ×1
SUT VIC AB 1 CT1 27XBRD ANBCTR (SUTURE) ×1 IMPLANT
SUT VIC AB 2-0 CT1 27 (SUTURE) ×1
SUT VIC AB 2-0 CT1 TAPERPNT 27 (SUTURE) ×1 IMPLANT
TOWEL OR 17X26 10 PK STRL BLUE (TOWEL DISPOSABLE) ×1 IMPLANT
TOWEL OR NON WOVEN STRL DISP B (DISPOSABLE) ×1 IMPLANT

## 2022-09-07 NOTE — Op Note (Unsigned)
NAMERYLIE, KNIERIM MEDICAL RECORD NO: [id number] ACCOUNT NO: [id number] DATE OF BIRTH: 21-Nov-1955 FACILITY: [facility] LOCATION: [facility] PHYSICIAN: Madlyn Frankel. Charlann Boxer, MD  Operative Report   DATE OF PROCEDURE: 09/07/2022  PREOPERATIVE DIAGNOSIS:  Left intertrochanteric hip fracture.  POSTOPERATIVE DIAGNOSIS:  Left intertrochanteric hip fracture.  PROCEDURE:  Closed reduction intramedullary nailing of left intertrochanteric femur fracture.  COMPONENTS USED:  A Zimmer Affixus nail 11 x 180 mm with 130-degree lag screw and a distal interlock.  SURGEON:  Madlyn Frankel. Charlann Boxer, MD.  ASSISTANT:  Rosalene Billings, PA-C.  Note that Ms. Domenic Schwab was present for the entirety of the case from preoperative positioning and set up, perioperative management of the operative extremity, general facilitation of case and primary wound closure.  ANESTHESIA:  General.  BLOOD LOSS:  Less than 50 mL.  DRAINS:  None.  COMPLICATIONS:  None.  INDICATIONS FOR PROCEDURE:  The patient is a 67 year old male who lost his footing in an elevator, falling onto his left side.  He had immediate onset of pain in his left hip.  Due to the inability to bear weight he was brought to the emergency room,  radiographs revealed an intertrochanteric femur fracture.  Orthopedics was consulted for definitive management.  The risks of nonunion, malunion, need for future surgery in addition to infection, DVT were reviewed and discussed.  Consent was obtained for  the benefit of fracture management, pain relief.  DESCRIPTION OF PROCEDURE:  The patient was brought to the operative theater.  Once adequate anesthesia, preoperative antibiotics, Ancef administered as well as tranexamic acid and Decadron.  He was positioned on the Hana table.  All bony prominences were  carefully padded and positioned.  The left lower extremity was placed into a traction boot.  Fluoroscopy was brought to the field and the fracture was found to be reduced in an  anatomic position with traction and internal rotation to 10 degrees.  At  this point, the left hip was prepped and draped in sterile fashion using shower curtain technique.  A timeout was performed identifying the patient, planned procedure, and extremity.  Fluoroscopy was brought back to the field.  Using the guidewire, we  identified the tip of the trochanter.  An incision was made proximal and lateral on the hip.  Soft tissue dissection was carried through the gluteal fascia.  The guidewire was then inserted into the tip of the trochanter and passed across the fracture  site fluoroscopically.  The proximal femur was drilled open.  The 11 x 180 mm nail was then passed by hand to its appropriate depth.  Using the insertion jig, the guidewire was passed then into the center of the femoral head in AP and lateral planes.   Once this was confirmed, I selected a 105 mm lag screw.  The femoral head and neck were drilled for the lag screw.  The lag screw was then positioned.  Once it was in its position, we removed the traction off the lower extremity and used the compression  wheel to medialize the shaft to the fracture site.  At this point, I locked the proximal locking bolt and then backed it off a quarter of a turn to allow for further compression and there was good compression through this fracture site.  A distal  interlock was placed distally through the insertion jig.  Once this was done, the fluoroscopy was used to obtain final images.  We then irrigated and removed the insertion jig.  The wounds were irrigated with normal  saline solution.  The gluteal fascia  was reapproximated using #1 Vicryl.  The remainder of wound was closed with 2-0 Vicryl and a running Monocryl stitches.  The wound was clean, dry and dressed sterilely using surgical glue and Aquacel dressing.  The patient was then brought to the  recovery room in stable condition, tolerating the procedure well.  Postoperatively, he will be  weightbearing as tolerated.  Will use aspirin for DVT prophylaxis.  He will be discharged on pain medication with plans to follow up in 2 weeks for wound evaluation and radiographic assessment.   CHR D: 09/07/2022 1:44:24 pm T: 09/07/2022 1:55:00 pm  JOB: [id number]/ [id number]

## 2022-09-07 NOTE — Consult Note (Signed)
ORTHOPAEDIC CONSULTATION  REQUESTING PHYSICIAN: Rhetta Mura, MD  PCP:  [name], MD  Chief Complaint:  Fall , left hip pain   HPI: Justin Suarez is a 67 y.o. male with past medical history of COPD, DVT/PE, hep C, recurrent alcoholic pancreatitis, HTN and alcohol abuse.  Today while stepping in the elevator, he made a turn and went down.  He hit the floor on his left hip.  His left arm got caught inside of the rail.  Per patient he his head never hit the floor he had no loss of consciousness.  At baseline the patient drinks 4-5 beers daily, today he was drinking [employer]. He describes himself as a functional alcoholic.   In the ER x-ray of the left hip shows acute, minimally displaced, anatomically aligned intertrochanteric fracture of the left hip.  Alcohol level 241.  Vitals stable   Orthopaedics was consulted for evaluation and management.  Today, he is resting in bed on 3W. He tells me he lives alone normally, but is going to stay with his girlfriend after surgery who will be able to help him. He has has multiple surgeries in the past without issues. He is allergic to dermabond.   Past Medical History:  Diagnosis Date   Abnormal CT scan, chest 06/2011   multiple lung nodules; needs repeat study in 3 to 12 months.   Arthritis    Hands   Chronic anticoagulation    Common bile duct dilation    COPD (chronic obstructive pulmonary disease) (HCC)    Pt denies   DVT (deep venous thrombosis) (HCC)    Gallstone pancreatitis    GERD (gastroesophageal reflux disease)    Hepatitis C    History of hiatal hernia    HTN (hypertension)    Noncompliance    Pancreatitis    Pancreatitis 06/2020   Pulmonary embolus (HCC)    Tick bite 07/2011   Tobacco abuse    Past Surgical History:  Procedure Laterality Date   ANKLE SURGERY Right 1998   3 surgeries.   CHOLECYSTECTOMY N/A 05/24/2020   Procedure: LAPAROSCOPIC CHOLECYSTECTOMY WITH INTRAOPERATIVE CHOLANGIOGRAM;   Surgeon: Gaynelle Adu, MD;  Location: [facility];  Service: General;  Laterality: N/A;   ESOPHAGOGASTRODUODENOSCOPY (EGD) WITH PROPOFOL N/A 04/01/2020   Procedure: ESOPHAGOGASTRODUODENOSCOPY (EGD) WITH PROPOFOL;  Surgeon: Rachael Fee, MD;  Location: [facility];  Service: Endoscopy;  Laterality: N/A;   EUS N/A 04/01/2020   Procedure: UPPER ENDOSCOPIC ULTRASOUND (EUS) RADIAL;  Surgeon: Rachael Fee, MD;  Location: [facility];  Service: Endoscopy;  Laterality: N/A;   FINGER AMPUTATION Left 1970   first part of middle finger removed   FOOT SURGERY Right    x 3 from a fall   INGUINAL HERNIA REPAIR Right    years ago   Social History   Socioeconomic History   Marital status: Divorced    Spouse name: Not on file   Number of children: 0   Years of education: Not on file   Highest education level: Not on file  Occupational History   Occupation: retired    Comment: Long distance truck driver  Tobacco Use   Smoking status: Every Day    Current packs/day: 0.25    Average packs/day: 0.3 packs/day for 43.0 years (10.8 ttl pk-yrs)    Types: Cigarettes   Smokeless tobacco: Never   Tobacco comments:    1 ppd for 40 years.  Vaping Use   Vaping status: Never Used  Substance and  Sexual Activity   Alcohol use: Yes    Comment: Occasional. 4-5 beers 12 oz./week.    Drug use: No   Sexual activity: Not Currently  Other Topics Concern   Not on file  Social History Narrative   Retired Naval architect    Social Determinants of Corporate investment banker Strain: Not on file  Food Insecurity: No Food Insecurity (09/06/2022)   Hunger Vital Sign    Worried About Running Out of Food in the Last Year: Never true    Ran Out of Food in the Last Year: Never true  Transportation Needs: No Transportation Needs (09/06/2022)   PRAPARE - Administrator, Civil Service (Medical): No    Lack of Transportation (Non-Medical): No  Physical Activity: Not on file  Stress: Not on file  Social  Connections: Not on file   Family History  Problem Relation Age of Onset   Kidney disease Mother        ESRD dialysis   Diabetes Mother    Heart disease Mother        CHF   Heart failure Mother    Heart disease Father    Heart disease Sister 66       CABG   Coronary artery disease Sister    Coronary artery disease Sister    Heart disease Sister    Cerebral aneurysm Sister    Atrial fibrillation Brother    Hypertension Brother    Esophageal cancer Neg Hx    Colon cancer Neg Hx    Rectal cancer Neg Hx    Stomach cancer Neg Hx    No Known Allergies Prior to Admission medications   Medication Sig Start Date End Date Taking? Authorizing Provider  Cyanocobalamin (B-12 PO) Take 1 tablet by mouth daily.   Yes [provider]   CT Head Wo Contrast  Result Date: 09/06/2022 CLINICAL DATA:  Fall in an Engineer, structural. Intoxication. Minor head trauma. EXAM: CT HEAD WITHOUT CONTRAST TECHNIQUE: Contiguous axial images were obtained from the base of the skull through the vertex without intravenous contrast. RADIATION DOSE REDUCTION: This exam was performed according to the departmental dose-optimization program which includes automated exposure control, adjustment of the mA and/or kV according to patient size and/or use of iterative reconstruction technique. COMPARISON:  None Available. FINDINGS: Brain: No evidence of acute infarction, hemorrhage, hydrocephalus, extra-axial collection or mass lesion/mass effect. Dilated perivascular space below the right putamen. Vascular: No hyperdense vessel or unexpected calcification. Skull: Normal. Negative for fracture or focal lesion. Sinuses/Orbits: No evidence of injury IMPRESSION: No evidence of intracranial injury. Electronically Signed   By: Tiburcio Pea M.D.   On: 09/06/2022 05:13   DG Hip Unilat W or Wo Pelvis 2-3 Views Left  Result Date: 09/06/2022 CLINICAL DATA:  Fall, left hip pain EXAM: DG HIP (WITH OR WITHOUT PELVIS) 2-3V LEFT COMPARISON:   None Available. FINDINGS: There is an acute, minimally displaced, anatomically aligned intratrochanteric fracture of the left hip with fracture fragments in near anatomic alignment. Left femoral head is still seated within the left acetabulum. Pelvis and visualized proximal right femur are intact. Mild bilateral degenerative hip arthritis. Vascular calcifications noted. IMPRESSION: 1. Acute, minimally displaced, anatomically aligned intratrochanteric fracture of the left hip. Electronically Signed   By: Helyn Numbers M.D.   On: 09/06/2022 01:30   DG Chest Portable 1 View  Result Date: 09/06/2022 CLINICAL DATA:  Fall, hip fracture, preoperative clearance for surgery. EXAM: PORTABLE CHEST 1 VIEW COMPARISON:  11/18/2020  FINDINGS: The heart size and mediastinal contours are within normal limits. Both lungs are clear. The visualized skeletal structures are unremarkable. IMPRESSION: No active disease. Electronically Signed   By: Helyn Numbers M.D.   On: 09/06/2022 01:28    Positive ROS: All other systems have been reviewed and were otherwise negative with the exception of those mentioned in the HPI and as above.  Physical Exam: General: Alert, no acute distress Cardiovascular: No pedal edema Respiratory: No cyanosis, no use of accessory musculature   MUSCULOSKELETAL:   Left lower extremity: ROM deferred due to known fracture Skin intact   Assessment: Left intertrochanteric femur fracture  Plan: Discussed diagnosis and proposed treatment with patient this morning.   Plan for OR this morning for IM nail. He will be PWB after surgery - will need a rolling walker. He plans to d/c home to his girlfriend's house.    Cassandria Anger, PA-C Cell [phone]   09/07/2022 7:35 AM

## 2022-09-07 NOTE — Anesthesia Preprocedure Evaluation (Addendum)
Anesthesia Evaluation  Patient identified by MRN, date of birth, ID band Patient awake    Reviewed: Allergy & Precautions, NPO status , Patient's Chart, lab work & pertinent test results  Airway Mallampati: II  TM Distance: >3 FB Neck ROM: Full    Dental  (+) Dental Advisory Given, Edentulous Upper, Edentulous Lower   Pulmonary COPD, Current Smoker and Patient abstained from smoking.   Pulmonary exam normal breath sounds clear to auscultation       Cardiovascular hypertension, + DVT  Normal cardiovascular exam Rhythm:Regular Rate:Normal     Neuro/Psych negative neurological ROS     GI/Hepatic hiatal hernia,GERD  ,,(+) Hepatitis -, C  Endo/Other  negative endocrine ROS    Renal/GU negative Renal ROS     Musculoskeletal  (+) Arthritis ,  LEFT INTERTROCH FRACTURE   Abdominal   Peds  Hematology  (+) Blood dyscrasia, anemia   Anesthesia Other Findings Day of surgery medications reviewed with the patient.  Reproductive/Obstetrics                             Anesthesia Physical Anesthesia Plan  ASA: 3  Anesthesia Plan: General   Post-op Pain Management: Tylenol PO (pre-op)*   Induction: Intravenous  PONV Risk Score and Plan: 2 and Midazolam, Dexamethasone and Ondansetron  Airway Management Planned: Oral ETT  Additional Equipment:   Intra-op Plan:   Post-operative Plan: Extubation in OR  Informed Consent: I have reviewed the patients History and Physical, chart, labs and discussed the procedure including the risks, benefits and alternatives for the proposed anesthesia with the patient or authorized representative who has indicated his/her understanding and acceptance.     Dental advisory given  Plan Discussed with: CRNA  Anesthesia Plan Comments:        Anesthesia Quick Evaluation

## 2022-09-07 NOTE — Plan of Care (Signed)
  Problem: Education: Goal: Knowledge of General Education information will improve Description: Including pain rating scale, medication(s)/side effects and non-pharmacologic comfort measures Outcome: Progressing   Problem: Clinical Measurements: Goal: Ability to maintain clinical measurements within normal limits will improve Outcome: Progressing   Problem: Activity: Goal: Risk for activity intolerance will decrease Outcome: Progressing   Problem: Nutrition: Goal: Adequate nutrition will be maintained Outcome: Progressing   Problem: Pain Managment: Goal: General experience of comfort will improve Outcome: Progressing   Problem: Safety: Goal: Ability to remain free from injury will improve Outcome: Progressing   Problem: Skin Integrity: Goal: Risk for impaired skin integrity will decrease Outcome: Progressing

## 2022-09-07 NOTE — Progress Notes (Signed)
HOSPITALIST ROUNDING NOTE [facility] VFI:[id number]  DOB: [birth year]  DOA: 09/06/2022  PCP: [name], MD  09/07/2022,4:36 PM   LOS: 1 day      Code Status: Full code   From: Home lives in [place] in an apartment current  Dispo: Probable skilled     67 year old white male Remote DVT PE 2013 no anticoagulation Reflux HTN Known chronic pancreatitis since 03/2020 + pseudocyst +, - symptomatic gallstone pancreatitis on admission 4/1 through 05/25/2020 status post lap chole 05/24/2020-was to receive repeat EUS/MRI pancreatic head/pseudocyst Smoker + COPD Chronic hep C which was treated by Dr. Marina Goodell? HTN  Came from home 08/2015 a.m. after having a fall in elevator denies hitting head-landed on left hip Found to have intertrochanteric hip fracture-orthopedics Dr. Yevette Edwards consulted patient kept n.p.o.-Rx morphine Ativan nicotine patch fentanyl etc. in ED-CT head negative for intracranial injury DG hip = intertrochanteric minimally displaced fracture left hip CXR no active disease Hemoglobin 12.5 WBC 7.8 platelet 213--sodium 131 chloride 19 INR 1.0  7/18 IM nail Dr. Charlann Boxer  Plan  Intertrochanteric hip fracture Postop management as per Ortho-May be able to discharge to girlfriend's house with a rolling walker? Defer weightbearing precautions, DVT prophylaxis and pain control to them  Underlying EtOH drinks 4-5 beers a day-EtOH level was 241 on arrival Known chronic pancreatitis + pseudocyst No active signs of withdrawal at this time-discontinue protocol in a.m.  Previous hepatitis C Fully treated by Dr. Marina Goodell previously-interval labs  Smoker Still smokes about half pack per day will order nicotine patch he is precontemplative  HTN not on meds Continues on hydralazine 5 every 4 as needed systolic >180 Will start metoprolol 12.5 twice daily  Reflux not on meds   DVT prophylaxis: Lovenox  Status is: Inpatient Remains inpatient appropriate because:   Requires surgery-orthopedics  is aware    Subjective:  Seen at PACU-relatively stable no distress looks well drinking soda Pain is minimal  Objective + exam Vitals:   09/07/22 1530 09/07/22 1545 09/07/22 1557 09/07/22 1559  BP: (!) 154/66 (!) 156/62 (!) 175/57 (!) 158/67  Pulse: 60 62 61 62  Resp: 14 17 18    Temp:   98.2 F (36.8 C)   TempSrc:   Oral   SpO2: 96% 98% 98%   Weight:      Height:       Filed Weights   09/06/22 0509 09/07/22 1133  Weight: 72.8 kg 72.8 kg    Examination:  Well-built and nourished Chest is clear S1-S2 no murmur Abdomen is soft no rebound no guarding  Data Reviewed: reviewed   CBC    Component Value Date/Time   WBC 3.9 (L) 09/07/2022 0400   RBC 3.71 (L) 09/07/2022 0400   HGB 11.8 (L) 09/07/2022 0400   HGB 12.6 (L) 01/31/2022 1404   HCT 35.4 (L) 09/07/2022 0400   HCT 36.7 (L) 01/31/2022 1404   PLT 153 09/07/2022 0400   PLT 255 01/31/2022 1404   MCV 95.4 09/07/2022 0400   MCV 95 01/31/2022 1404   MCH 31.8 09/07/2022 0400   MCHC 33.3 09/07/2022 0400   RDW 13.5 09/07/2022 0400   RDW 12.6 01/31/2022 1404   LYMPHSABS 1.4 09/07/2022 0400   LYMPHSABS 1.7 01/31/2022 1404   MONOABS 0.5 09/07/2022 0400   EOSABS 0.1 09/07/2022 0400   EOSABS 0.2 01/31/2022 1404   BASOSABS 0.0 09/07/2022 0400   BASOSABS 0.0 01/31/2022 1404      Latest Ref Rng & Units 09/07/2022    4:00 AM 09/06/2022  1:42 AM 09/06/2022    1:00 AM  CMP  Glucose 70 - 99 mg/dL 500  99  96   BUN 8 - 23 mg/dL 13  4  7    Creatinine 0.61 - 1.24 mg/dL 9.38  1.82  9.93   Sodium 135 - 145 mmol/L 135  134  131   Potassium 3.5 - 5.1 mmol/L 4.6  3.8  3.6   Chloride 98 - 111 mmol/L 101  97  101   CO2 22 - 32 mmol/L 29   19   Calcium 8.9 - 10.3 mg/dL 8.4   8.0   Total Protein 6.5 - 8.1 g/dL   6.7   Total Bilirubin 0.3 - 1.2 mg/dL   0.5   Alkaline Phos 38 - 126 U/L   106   AST 15 - 41 U/L   30   ALT 0 - 44 U/L   31      Scheduled Meds:  acetaminophen  650 mg Oral Q6H   [START ON 09/08/2022]  dexamethasone (DECADRON) injection  10 mg Intravenous Once   docusate sodium  100 mg Oral BID   folic acid  1 mg Oral Daily   hydrALAZINE       multivitamin with minerals  1 tablet Oral Daily   nicotine  14 mg Transdermal Daily   polyethylene glycol  17 g Oral BID   [START ON 09/08/2022] rivaroxaban  10 mg Oral Q breakfast   thiamine  100 mg Oral Daily   Or   thiamine  100 mg Intravenous Daily   Continuous Infusions:  sodium chloride 75 mL/hr at 09/07/22 1608    ceFAZolin (ANCEF) IV     methocarbamol (ROBAXIN) IV      Time  44  Rhetta Mura, MD  [facility]

## 2022-09-07 NOTE — Anesthesia Postprocedure Evaluation (Signed)
Anesthesia Post Note  Patient: Justin Suarez  Procedure(s) Performed: INTRAMEDULLARY (IM) NAIL INTERTROCHANTERIC (Left: Hip)     Patient location during evaluation: PACU Anesthesia Type: General Level of consciousness: awake and alert Pain management: pain level controlled Vital Signs Assessment: post-procedure vital signs reviewed and stable Respiratory status: spontaneous breathing, nonlabored ventilation, respiratory function stable and patient connected to nasal cannula oxygen Cardiovascular status: blood pressure returned to baseline and stable Postop Assessment: no apparent nausea or vomiting Anesthetic complications: no   No notable events documented.  Last Vitals:  Vitals:   09/07/22 1557 09/07/22 1559  BP: (!) 175/57 (!) 158/67  Pulse: 61 62  Resp: 18   Temp: 36.8 C   SpO2: 98%     Last Pain:  Vitals:   09/07/22 1725  TempSrc:   PainSc: 5                  Collene Schlichter

## 2022-09-07 NOTE — Brief Op Note (Signed)
09/06/2022 - 09/07/2022  1:39 PM  PATIENT:  [name]  67 y.o. male  PRE-OPERATIVE DIAGNOSIS:  LEFT INTERTROCHanteric hip FRACTURE  POST-OPERATIVE DIAGNOSIS:  LEFT INTERTROCHanteric hip FRACTURE  PROCEDURE:  Procedure(s): INTRAMEDULLARY (IM) NAIL INTERTROCHANTERIC (Left)  SURGEON:  Surgeons and Role:    Durene Romans, MD - Primary  PHYSICIAN ASSISTANT: Rosalene Billings, PA-C  ANESTHESIA:   general  EBL:  <50 cc  BLOOD ADMINISTERED:none  DRAINS: none   LOCAL MEDICATIONS USED:  NONE  SPECIMEN:  No Specimen  DISPOSITION OF SPECIMEN:  N/A  COUNTS:  YES  TOURNIQUET:  * No tourniquets in log *  DICTATION: .Other Dictation: Dictation Number [id number]  PLAN OF CARE: Admit to inpatient   PATIENT DISPOSITION:  PACU - hemodynamically stable.   Delay start of Pharmacological VTE agent (>24hrs) due to surgical blood loss or risk of bleeding: no

## 2022-09-07 NOTE — Plan of Care (Signed)
Ensure max 23:30. CL to 04:30. Then NPO.

## 2022-09-07 NOTE — Anesthesia Procedure Notes (Signed)
Procedure Name: Intubation Date/Time: 09/07/2022 12:48 PM  Performed by: Maurene Capes, CRNAPre-anesthesia Checklist: Emergency Drugs available, Patient identified, Suction available and Patient being monitored Patient Re-evaluated:Patient Re-evaluated prior to induction Oxygen Delivery Method: Circle system utilized Preoxygenation: Pre-oxygenation with 100% oxygen Induction Type: IV induction Ventilation: Mask ventilation without difficulty Laryngoscope Size: Mac and 4 Tube type: Oral Tube size: 7.5 mm Number of attempts: 1 Airway Equipment and Method: Stylet and Oral airway Placement Confirmation: ETT inserted through vocal cords under direct vision, positive ETCO2 and breath sounds checked- equal and bilateral Secured at: 23 cm Tube secured with: Tape Dental Injury: Teeth and Oropharynx as per pre-operative assessment

## 2022-09-07 NOTE — Discharge Instructions (Addendum)
INSTRUCTIONS AFTER SURGERY  You have a history of blood clot in your legs/lungs - because of this we will have you on a blood thinner called Xarelto for 2 weeks. After you finish this, you will then take aspirin 81 mg twice a day for 4 weeks.   Pain Regimen Instructions:  - Take tylenol 1000 mg every 6-8 hours around the clock  - Take the muscle relaxant around the clock  - Then take 5 mg (1 tablet) of oxycodone every 4-6 hours as needed for severe pain  - Ice and elevate your leg as often as you can  Do not take over the counter pain medication until instructed by [place].  If this is not controlling your pain, or you need refills - call our office at [phone] or send a message via the [employer] portal.   Take the stool softeners provided until you are having regular bowel movements, and then you may discontinue.    Remove items at home which could result in a fall. This includes throw rugs or furniture in walking pathways ICE to the affected joint every three hours while awake for 30 minutes at a time, for at least the first 3-5 days, and then as needed for pain and swelling.  Continue to use ice for pain and swelling. You may notice swelling that will progress down to the foot and ankle.  This is normal after surgery.  Elevate your leg when you are not up walking on it.   Continue to use the breathing machine you got in the hospital (incentive spirometer) which will help keep your temperature down.  It is common for your temperature to cycle up and down following surgery, especially at night when you are not up moving around and exerting yourself.  The breathing machine keeps your lungs expanded and your temperature down.   DIET:  As you were doing prior to hospitalization, we recommend a well-balanced diet.  DRESSING / WOUND CARE / SHOWERING  Keep the surgical dressing until follow up.  The dressing is water proof, so you can shower without any extra covering.  IF THE DRESSING FALLS OFF or  the wound gets wet inside, change the dressing with sterile gauze.  Please use good hand washing techniques before changing the dressing.  Do not use any lotions or creams on the incision until instructed by your surgeon.    ACTIVITY  Increase activity slowly as tolerated, but follow the weight bearing instructions below.   No driving for 6 weeks or until further direction given by your physician.  You cannot drive while taking narcotics.  No lifting or carrying greater than 10 lbs. until further directed by your surgeon. Avoid periods of inactivity such as sitting longer than an hour when not asleep. This helps prevent blood clots.  You may return to work once you are authorized by your doctor.     WEIGHT BEARING   Partial weight bearing with assist device as directed.     CONSTIPATION  Constipation is defined medically as fewer than three stools per week and severe constipation as less than one stool per week.  Even if you have a regular bowel pattern at home, your normal regimen is likely to be disrupted due to multiple reasons following surgery.  Combination of anesthesia, postoperative narcotics, change in appetite and fluid intake all can affect your bowels.   YOU MUST use at least one of the following options; they are listed in order of increasing strength to get  the job done.  They are all available over the counter, and you may need to use some, POSSIBLY even all of these options:    Drink plenty of fluids (prune juice may be helpful) and high fiber foods Colace 100 mg by mouth twice a day  Senokot for constipation as directed and as needed Dulcolax (bisacodyl), take with full glass of water  Miralax (polyethylene glycol) once or twice a day as needed.  If you have tried all these things and are unable to have a bowel movement in the first 3-4 days after surgery call either your surgeon or your primary doctor.    If you experience loose stools or diarrhea, hold the medications  until you stool forms back up.  If your symptoms do not get better within 1 week or if they get worse, check with your doctor.  If you experience "the worst abdominal pain ever" or develop nausea or vomiting, please contact the office immediately for further recommendations for treatment.   ITCHING:  If you experience itching with your medications, try taking only a single pain pill, or even half a pain pill at a time.  You can also use Benadryl over the counter for itching or also to help with sleep.   TED HOSE STOCKINGS:  Use stockings on both legs until for at least 2 weeks or as directed by physician office. They may be removed at night for sleeping.  MEDICATIONS:  See your medication summary on the "After Visit Summary" that nursing will review with you.  You may have some home medications which will be placed on hold until you complete the course of blood thinner medication.  It is important for you to complete the blood thinner medication as prescribed.  PRECAUTIONS:  If you experience chest pain or shortness of breath - call 911 immediately for transfer to the hospital emergency department.   If you develop a fever greater that 101 F, purulent drainage from wound, increased redness or drainage from wound, foul odor from the wound/dressing, or calf pain - CONTACT YOUR SURGEON.                                                   FOLLOW-UP APPOINTMENTS:  If you do not already have a post-op appointment, please call the office for an appointment to be seen by your surgeon.  Guidelines for how soon to be seen are listed in your "After Visit Summary", but are typically between 1-4 weeks after surgery.  POST-OPERATIVE OPIOID TAPER INSTRUCTIONS: It is important to wean off of your opioid medication as soon as possible. If you do not need pain medication after your surgery it is ok to stop day one. Opioids include: Codeine, Hydrocodone(Norco, Vicodin), Oxycodone(Percocet, oxycontin) and hydromorphone  amongst others.  Long term and even short term use of opiods can cause: Increased pain response Dependence Constipation Depression Respiratory depression And more.  Withdrawal symptoms can include Flu like symptoms Nausea, vomiting And more Techniques to manage these symptoms Hydrate well Eat regular healthy meals Stay active Use relaxation techniques(deep breathing, meditating, yoga) Do Not substitute Alcohol to help with tapering If you have been on opioids for less than two weeks and do not have pain than it is ok to stop all together.  Plan to wean off of opioids This plan should start within one  week post op of your joint replacement. Maintain the same interval or time between taking each dose and first decrease the dose.  Cut the total daily intake of opioids by one tablet each day Next start to increase the time between doses. The last dose that should be eliminated is the evening dose.   MAKE SURE YOU:  Understand these instructions.  Get help right away if you are not doing well or get worse.    Thank you for letting us be a part of your medical care team.  It is a privilege we respect greatly.  We hope these instructions will help you stay on track for a fast and full recovery!

## 2022-09-07 NOTE — H&P (View-Only) (Signed)
ORTHOPAEDIC CONSULTATION  REQUESTING PHYSICIAN: Rhetta Mura, MD  PCP:  [name], MD  Chief Complaint:  Fall , left hip pain   HPI: Justin Suarez is a 67 y.o. male with past medical history of COPD, DVT/PE, hep C, recurrent alcoholic pancreatitis, HTN and alcohol abuse.  Today while stepping in the elevator, he made a turn and went down.  He hit the floor on his left hip.  His left arm got caught inside of the rail.  Per patient he his head never hit the floor he had no loss of consciousness.  At baseline the patient drinks 4-5 beers daily, today he was drinking crown royal. He describes himself as a functional alcoholic.   In the ER x-ray of the left hip shows acute, minimally displaced, anatomically aligned intertrochanteric fracture of the left hip.  Alcohol level 241.  Vitals stable   Orthopaedics was consulted for evaluation and management.  Today, he is resting in bed on 3W. He tells me he lives alone normally, but is going to stay with his girlfriend after surgery who will be able to help him. He has has multiple surgeries in the past without issues. He is allergic to dermabond.   Past Medical History:  Diagnosis Date   Abnormal CT scan, chest 06/2011   multiple lung nodules; needs repeat study in 3 to 12 months.   Arthritis    Hands   Chronic anticoagulation    Common bile duct dilation    COPD (chronic obstructive pulmonary disease) (HCC)    Pt denies   DVT (deep venous thrombosis) (HCC)    Gallstone pancreatitis    GERD (gastroesophageal reflux disease)    Hepatitis C    History of hiatal hernia    HTN (hypertension)    Noncompliance    Pancreatitis    Pancreatitis 06/2020   Pulmonary embolus (HCC)    Tick bite 07/2011   Tobacco abuse    Past Surgical History:  Procedure Laterality Date   ANKLE SURGERY Right 1998   3 surgeries.   CHOLECYSTECTOMY N/A 05/24/2020   Procedure: LAPAROSCOPIC CHOLECYSTECTOMY WITH INTRAOPERATIVE CHOLANGIOGRAM;   Surgeon: Gaynelle Adu, MD;  Location: [facility];  Service: General;  Laterality: N/A;   ESOPHAGOGASTRODUODENOSCOPY (EGD) WITH PROPOFOL N/A 04/01/2020   Procedure: ESOPHAGOGASTRODUODENOSCOPY (EGD) WITH PROPOFOL;  Surgeon: Rachael Fee, MD;  Location: [facility];  Service: Endoscopy;  Laterality: N/A;   EUS N/A 04/01/2020   Procedure: UPPER ENDOSCOPIC ULTRASOUND (EUS) RADIAL;  Surgeon: Rachael Fee, MD;  Location: [facility];  Service: Endoscopy;  Laterality: N/A;   FINGER AMPUTATION Left 1970   first part of middle finger removed   FOOT SURGERY Right    x 3 from a fall   INGUINAL HERNIA REPAIR Right    years ago   Social History   Socioeconomic History   Marital status: Divorced    Spouse name: Not on file   Number of children: 0   Years of education: Not on file   Highest education level: Not on file  Occupational History   Occupation: retired    Comment: Long distance truck driver  Tobacco Use   Smoking status: Every Day    Current packs/day: 0.25    Average packs/day: 0.3 packs/day for 43.0 years (10.8 ttl pk-yrs)    Types: Cigarettes   Smokeless tobacco: Never   Tobacco comments:    1 ppd for 40 years.  Vaping Use   Vaping status: Never Used  Substance and  Sexual Activity   Alcohol use: Yes    Comment: Occasional. 4-5 beers 12 oz./week.    Drug use: No   Sexual activity: Not Currently  Other Topics Concern   Not on file  Social History Narrative   Retired Naval architect    Social Determinants of Corporate investment banker Strain: Not on file  Food Insecurity: No Food Insecurity (09/06/2022)   Hunger Vital Sign    Worried About Running Out of Food in the Last Year: Never true    Ran Out of Food in the Last Year: Never true  Transportation Needs: No Transportation Needs (09/06/2022)   PRAPARE - Administrator, Civil Service (Medical): No    Lack of Transportation (Non-Medical): No  Physical Activity: Not on file  Stress: Not on file  Social  Connections: Not on file   Family History  Problem Relation Age of Onset   Kidney disease Mother        ESRD dialysis   Diabetes Mother    Heart disease Mother        CHF   Heart failure Mother    Heart disease Father    Heart disease Sister 66       CABG   Coronary artery disease Sister    Coronary artery disease Sister    Heart disease Sister    Cerebral aneurysm Sister    Atrial fibrillation Brother    Hypertension Brother    Esophageal cancer Neg Hx    Colon cancer Neg Hx    Rectal cancer Neg Hx    Stomach cancer Neg Hx    No Known Allergies Prior to Admission medications   Medication Sig Start Date End Date Taking? Authorizing Provider  Cyanocobalamin (B-12 PO) Take 1 tablet by mouth daily.   Yes [provider]   CT Head Wo Contrast  Result Date: 09/06/2022 CLINICAL DATA:  Fall in an Engineer, structural. Intoxication. Minor head trauma. EXAM: CT HEAD WITHOUT CONTRAST TECHNIQUE: Contiguous axial images were obtained from the base of the skull through the vertex without intravenous contrast. RADIATION DOSE REDUCTION: This exam was performed according to the departmental dose-optimization program which includes automated exposure control, adjustment of the mA and/or kV according to patient size and/or use of iterative reconstruction technique. COMPARISON:  None Available. FINDINGS: Brain: No evidence of acute infarction, hemorrhage, hydrocephalus, extra-axial collection or mass lesion/mass effect. Dilated perivascular space below the right putamen. Vascular: No hyperdense vessel or unexpected calcification. Skull: Normal. Negative for fracture or focal lesion. Sinuses/Orbits: No evidence of injury IMPRESSION: No evidence of intracranial injury. Electronically Signed   By: Tiburcio Pea M.D.   On: 09/06/2022 05:13   DG Hip Unilat W or Wo Pelvis 2-3 Views Left  Result Date: 09/06/2022 CLINICAL DATA:  Fall, left hip pain EXAM: DG HIP (WITH OR WITHOUT PELVIS) 2-3V LEFT COMPARISON:   None Available. FINDINGS: There is an acute, minimally displaced, anatomically aligned intratrochanteric fracture of the left hip with fracture fragments in near anatomic alignment. Left femoral head is still seated within the left acetabulum. Pelvis and visualized proximal right femur are intact. Mild bilateral degenerative hip arthritis. Vascular calcifications noted. IMPRESSION: 1. Acute, minimally displaced, anatomically aligned intratrochanteric fracture of the left hip. Electronically Signed   By: Helyn Numbers M.D.   On: 09/06/2022 01:30   DG Chest Portable 1 View  Result Date: 09/06/2022 CLINICAL DATA:  Fall, hip fracture, preoperative clearance for surgery. EXAM: PORTABLE CHEST 1 VIEW COMPARISON:  11/18/2020  FINDINGS: The heart size and mediastinal contours are within normal limits. Both lungs are clear. The visualized skeletal structures are unremarkable. IMPRESSION: No active disease. Electronically Signed   By: Helyn Numbers M.D.   On: 09/06/2022 01:28    Positive ROS: All other systems have been reviewed and were otherwise negative with the exception of those mentioned in the HPI and as above.  Physical Exam: General: Alert, no acute distress Cardiovascular: No pedal edema Respiratory: No cyanosis, no use of accessory musculature   MUSCULOSKELETAL:   Left lower extremity: ROM deferred due to known fracture Skin intact   Assessment: Left intertrochanteric femur fracture  Plan: Discussed diagnosis and proposed treatment with patient this morning.   Plan for OR this morning for IM nail. He will be PWB after surgery - will need a rolling walker. He plans to d/c home to his girlfriend's house.    Cassandria Anger, PA-C Cell [phone]   09/07/2022 7:35 AM

## 2022-09-07 NOTE — Interval H&P Note (Signed)
History and Physical Interval Note:  09/07/2022 11:18 AM  Justin Suarez  has presented today for surgery, with the diagnosis of LEFT INTERTROCH FRACTURE.  The various methods of treatment have been discussed with the patient and family. After consideration of risks, benefits and other options for treatment, the patient has consented to  Procedure(s): INTRAMEDULLARY (IM) NAIL INTERTROCHANTERIC (Left) as a surgical intervention.  The patient's history has been reviewed, patient examined, no change in status, stable for surgery.  I have reviewed the patient's chart and labs.  Questions were answered to the patient's satisfaction.     Shelda Pal

## 2022-09-07 NOTE — Transfer of Care (Signed)
Immediate Anesthesia Transfer of Care Note  Patient: Justin Suarez  Procedure(s) Performed: INTRAMEDULLARY (IM) NAIL INTERTROCHANTERIC (Left: Hip)  Patient Location: PACU  Anesthesia Type:General  Level of Consciousness: awake, alert , and oriented  Airway & Oxygen Therapy: Patient Spontanous Breathing and Patient connected to face mask oxygen  Post-op Assessment: Report given to RN and Post -op Vital signs reviewed and stable  Post vital signs: Reviewed and stable  Last Vitals:  Vitals Value Taken Time  BP 167/72 09/07/22 1400  Temp    Pulse 53 09/07/22 1401  Resp 12 09/07/22 1401  SpO2 100 % 09/07/22 1401  Vitals shown include unfiled device data.  Last Pain:  Vitals:   09/07/22 1149  TempSrc: Oral  PainSc:       Patients Stated Pain Goal: 3 (09/06/22 2327)  Complications: No notable events documented.

## 2022-09-08 DIAGNOSIS — S72001A Fracture of unspecified part of neck of right femur, initial encounter for closed fracture: Secondary | ICD-10-CM | POA: Diagnosis not present

## 2022-09-08 LAB — CBC
HCT: 33.1 % — ABNORMAL LOW (ref 39.0–52.0)
Hemoglobin: 10.9 g/dL — ABNORMAL LOW (ref 13.0–17.0)
MCH: 31.2 pg (ref 26.0–34.0)
MCHC: 32.9 g/dL (ref 30.0–36.0)
MCV: 94.8 fL (ref 80.0–100.0)
Platelets: 145 10*3/uL — ABNORMAL LOW (ref 150–400)
RBC: 3.49 MIL/uL — ABNORMAL LOW (ref 4.22–5.81)
RDW: 13.2 % (ref 11.5–15.5)
WBC: 6 10*3/uL (ref 4.0–10.5)
nRBC: 0 % (ref 0.0–0.2)

## 2022-09-08 LAB — BASIC METABOLIC PANEL
Anion gap: 5 (ref 5–15)
BUN: 12 mg/dL (ref 8–23)
CO2: 26 mmol/L (ref 22–32)
Calcium: 8 mg/dL — ABNORMAL LOW (ref 8.9–10.3)
Chloride: 99 mmol/L (ref 98–111)
Creatinine, Ser: 0.76 mg/dL (ref 0.61–1.24)
GFR, Estimated: >60 mL/min (ref >60)
Glucose, Bld: 142 mg/dL — ABNORMAL HIGH (ref 70–99)
Potassium: 4.4 mmol/L (ref 3.5–5.1)
Sodium: 130 mmol/L — ABNORMAL LOW (ref 135–145)

## 2022-09-08 MED ORDER — POLYETHYLENE GLYCOL 3350 17 G PO PACK: 17.0000 g | PACK | Freq: Two times a day (BID) | ORAL | 0 refills | Status: AC

## 2022-09-08 MED ORDER — RIVAROXABAN 10 MG PO TABS: 10.0000 mg | ORAL_TABLET | Freq: Every day | ORAL | 0 refills | Status: DC

## 2022-09-08 MED ORDER — OXYCODONE HCL 5 MG PO TABS: 5.0000 mg | ORAL_TABLET | ORAL | 0 refills | Status: DC | PRN

## 2022-09-08 MED ORDER — METOPROLOL TARTRATE 25 MG PO TABS: 12.5000 mg | ORAL_TABLET | Freq: Two times a day (BID) | ORAL | 2 refills | Status: AC

## 2022-09-08 MED ORDER — SENNOSIDES-DOCUSATE SODIUM 8.6-50 MG PO TABS: 2.0000 | ORAL_TABLET | Freq: Every evening | ORAL | 0 refills | Status: AC | PRN

## 2022-09-08 MED ORDER — METHOCARBAMOL 500 MG PO TABS: 500.0000 mg | ORAL_TABLET | Freq: Four times a day (QID) | ORAL | 2 refills | Status: AC | PRN

## 2022-09-08 NOTE — Plan of Care (Signed)
Slept well with reported pain 2/10 this shift.

## 2022-09-08 NOTE — Evaluation (Signed)
Physical Therapy Evaluation Patient Details Name: Justin Suarez MRN: [id number] DOB: March 07, 1955 Today's Date: 09/08/2022  History of Present Illness  Pt is a 67 year old male s/p Closed reduction intramedullary nailing of left intertrochanteric femur fracture on 09/07/22 due to fall prior to admission with sustained left intertrochanteric femur fracture  Clinical Impression  Patient is s/p above surgery resulting in functional limitations due to the deficits listed below (see PT Problem List).  Patient will benefit from acute skilled PT to increase their independence and safety with mobility to facilitate discharge.  Pt pleasant and cooperative.  Pt eager to ambulate.  Pt utilized RW and maintains PWB well.  Pt would prefer to trial crutches since he reports using crutches at home if having any pain or swelling issues (states hx of multiple right ankle surgeries).  Plan to trial crutches next session and practice step in preparation for d/c.  Pt anticipates d/c home and states his girlfriend can assist him if needed upon d/c.         Assistance Recommended at Discharge PRN  If plan is discharge home, recommend the following:  Can travel by private vehicle  Help with stairs or ramp for entrance;Assist for transportation;Assistance with cooking/housework        Equipment Recommendations Other (comment) (may need RW, pt wants to try crutches first (he has crutches at home))  Recommendations for Other Services       Functional Status Assessment Patient has had a recent decline in their functional status and demonstrates the ability to make significant improvements in function in a reasonable and predictable amount of time.     Precautions / Restrictions Precautions Precautions: Fall Restrictions Weight Bearing Restrictions: Yes LLE Weight Bearing: Partial weight bearing LLE Partial Weight Bearing Percentage or Pounds: 50      Mobility  Bed Mobility Overal bed mobility: Needs  Assistance Bed Mobility: Supine to Sit     Supine to sit: Min guard, HOB elevated     General bed mobility comments: pt self assisted L LE over EOB with UEs    Transfers Overall transfer level: Needs assistance Equipment used: Rolling walker (2 wheels) Transfers: Sit to/from Stand Sit to Stand: Min guard           General transfer comment: min/guard for safety, verbal cues for UE and LE positioning    Ambulation/Gait Ambulation/Gait assistance: Min guard Gait Distance (Feet): 200 Feet Assistive device: Rolling walker (2 wheels) Gait Pattern/deviations: Step-to pattern, Decreased stance time - left, Antalgic       General Gait Details: verbal cues for PWB, RW positioning, step length, pt tends to use left forefoot only, no heel strike, denies increased pain  Stairs            Wheelchair Mobility     Tilt Bed    Modified Rankin (Stroke Patients Only)       Balance Overall balance assessment: History of Falls                                           Pertinent Vitals/Pain Pain Assessment Pain Assessment: 0-10 Pain Score: 3  Pain Location: left thigh Pain Descriptors / Indicators: Sore, Tightness Pain Intervention(s): Repositioned, Monitored during session    Home Living Family/patient expects to be discharged to:: Private residence Living Arrangements: Alone Available Help at Discharge: Other (Comment) (signifcant other can assist as needed)  Type of Home: House Home Access: Stairs to enter   [employer] of Steps: 1   Home Layout: One level Home Equipment: Crutches      Prior Function Prior Level of Function : Independent/Modified Independent                     Hand Dominance        Extremity/Trunk Assessment        Lower Extremity Assessment Lower Extremity Assessment: LLE deficits/detail;RLE deficits/detail RLE Deficits / Details: reports hx of multiple right ankle surgeries LLE Deficits /  Details: anticipated post op hip weakness observed       Communication   Communication: No difficulties  Cognition Arousal/Alertness: Awake/alert Behavior During Therapy: WFL for tasks assessed/performed Overall Cognitive Status: Within Functional Limits for tasks assessed                                          General Comments      Exercises     Assessment/Plan    PT Assessment Patient needs continued PT services  PT Problem List Decreased strength;Pain;Decreased activity tolerance;Decreased balance;Decreased mobility;Decreased knowledge of use of DME;Decreased knowledge of precautions       PT Treatment Interventions DME instruction;Balance training;Gait training;Therapeutic exercise;Functional mobility training;Stair training;Therapeutic activities;Patient/family education    PT Goals (Current goals can be found in the Care Plan section)  Acute Rehab PT Goals PT Goal Formulation: With patient Time For Goal Achievement: 09/15/22 Potential to Achieve Goals: Good    Frequency Min 1X/week     Co-evaluation               AM-PAC PT "6 Clicks" Mobility  Outcome Measure Help needed turning from your back to your side while in a flat bed without using bedrails?: A Little Help needed moving from lying on your back to sitting on the side of a flat bed without using bedrails?: A Little Help needed moving to and from a bed to a chair (including a wheelchair)?: A Little Help needed standing up from a chair using your arms (e.g., wheelchair or bedside chair)?: A Little Help needed to walk in hospital room?: A Little Help needed climbing 3-5 steps with a railing? : A Little 6 Click Score: 18    End of Session Equipment Utilized During Treatment: Gait belt Activity Tolerance: Patient tolerated treatment well Patient left: in chair;with call bell/phone within reach;with chair alarm set   PT Visit Diagnosis: Difficulty in walking, not elsewhere  classified (R26.2)    Time: 1610-9604 PT Time Calculation (min) (ACUTE ONLY): 19 min   Charges:   PT Evaluation $PT Eval Low Complexity: 1 Low   PT General Charges $$ ACUTE PT VISIT: 1 Visit        Thomasene Mohair PT, DPT Physical Therapist Acute Rehabilitation Services Office: [phone]   Janan Halter [place] 09/08/2022, 12:06 PM

## 2022-09-08 NOTE — TOC Transition Note (Signed)
Transition of Care [facility]) - CM/SW Discharge Note  Patient Details  Name: Justin Suarez MRN: [id number] Date of Birth: Nov 19, 1955  [facility] of [facility] CM/SW Contact:  Ewing Schlein, LCSW Phone Number: 09/08/2022, 2:29 PM  Clinical Narrative: PT evaluation recommended HHPT. Patient agreeable to HHPT referral. Patient already has crutches at home and prefers to use these, so no rolling walker is needed at this time. CSW made HHPT referral to Cindie with [facility], which was accepted. HH orders have been placed. CSW updated patient. TOC signing off.  Final next level of care: Home w Home Health Services Barriers to Discharge: Barriers Resolved  Patient Goals and CMS Choice CMS Medicare.gov Compare Post Acute Care list provided to:: Patient Choice offered to / list presented to : Patient  Discharge Plan and Services Additional resources added to the After Visit Summary for   In-house Referral: Clinical Social Work        DME Arranged: N/A DME Agency: NA HH Arranged: PT HH Agency: [facility] Date [facility] Agency Contacted: 09/08/22 Time HH Agency Contacted: [id number] Representative spoke with at [facility] Agency: Cindie  Social Determinants of Health (SDOH) Interventions SDOH Screenings   Food Insecurity: No Food Insecurity (09/06/2022)  Housing: Low Risk  (09/06/2022)  Transportation Needs: No Transportation Needs (09/06/2022)  Utilities: Not At Risk (09/06/2022)  Depression (PHQ2-9): Low Risk  (01/31/2022)  Tobacco Use: High Risk (09/07/2022)   Readmission Risk Interventions     No data to display

## 2022-09-08 NOTE — Progress Notes (Signed)
Physical Therapy Treatment Patient Details Name: Justin Suarez MRN: [id number] DOB: [birth year] Today's Date: 09/08/2022   History of Present Illness Pt is a 67 year old male s/p Closed reduction intramedullary nailing of left intertrochanteric femur fracture on 09/07/22 due to fall prior to admission with sustained left intertrochanteric femur fracture    PT Comments  Pt ambulated in hallway and practiced one step with crutches.  Pt educated on PWB status and he feels able to maintain PWB with crutches.  Pt had no unsteadiness or LOB.  Pt had no further questions or concerns.       Assistance Recommended at Discharge PRN  If plan is discharge home, recommend the following:  Can travel by private vehicle    Help with stairs or ramp for entrance;Assist for transportation;Assistance with cooking/housework      Equipment Recommendations  None recommended by PT    Recommendations for Other Services       Precautions / Restrictions Precautions Precautions: Fall Restrictions Weight Bearing Restrictions: Yes LLE Weight Bearing: Partial weight bearing LLE Partial Weight Bearing Percentage or Pounds: 50     Mobility  Bed Mobility Overal bed mobility: Needs Assistance Bed Mobility: Supine to Sit     Supine to sit: Min guard, HOB elevated     General bed mobility comments: pt up in recliner    Transfers Overall transfer level: Needs assistance Equipment used: Crutches Transfers: Sit to/from Stand Sit to Stand: Min guard           General transfer comment: min/guard for safety, able to perform transfer well with crutches appropriately    Ambulation/Gait Ambulation/Gait assistance: Min guard Gait Distance (Feet): 200 Feet Assistive device: Rolling walker (2 wheels) Gait Pattern/deviations: Step-to pattern, Decreased stance time - left, Antalgic       General Gait Details: verbal cues for PWB, crutch BOS, step length, no LOB or unsteadiness with crutches  observed; also stressed importance of maintaining PWB status   Stairs Stairs: Yes Stairs assistance: Min guard Stair Management: Step to pattern, Forwards, With crutches Number of Stairs: 1 General stair comments: verbal cues for positioning and safety, sequence; performed twice   Wheelchair Mobility     Tilt Bed    Modified Rankin (Stroke Patients Only)       Balance Overall balance assessment: History of Falls                                          Cognition Arousal/Alertness: Awake/alert Behavior During Therapy: WFL for tasks assessed/performed Overall Cognitive Status: Within Functional Limits for tasks assessed                                          Exercises      General Comments        Pertinent Vitals/Pain Pain Assessment Pain Assessment: 0-10 Pain Score: 4  Pain Location: left thigh Pain Descriptors / Indicators: Sore, Tightness Pain Intervention(s): Monitored during session, Repositioned    Home Living Family/patient expects to be discharged to:: Private residence Living Arrangements: Alone Available Help at Discharge: Other (Comment) (signifcant other can assist as needed) Type of Home: House Home Access: Stairs to enter   [employer] of Steps: 1   Home Layout: One level Home Equipment: Crutches  Prior Function            PT Goals (current goals can now be found in the care plan section) Acute Rehab PT Goals PT Goal Formulation: With patient Time For Goal Achievement: 09/15/22 Potential to Achieve Goals: Good Progress towards PT goals: Progressing toward goals    Frequency    Min 1X/week      PT Plan Current plan remains appropriate    Co-evaluation              AM-PAC PT "6 Clicks" Mobility   Outcome Measure  Help needed turning from your back to your side while in a flat bed without using bedrails?: A Little Help needed moving from lying on your back to  sitting on the side of a flat bed without using bedrails?: A Little Help needed moving to and from a bed to a chair (including a wheelchair)?: A Little Help needed standing up from a chair using your arms (e.g., wheelchair or bedside chair)?: A Little Help needed to walk in hospital room?: A Little Help needed climbing 3-5 steps with a railing? : A Little 6 Click Score: 18    End of Session Equipment Utilized During Treatment: Gait belt Activity Tolerance: Patient tolerated treatment well Patient left: in chair;with call bell/phone within reach;with chair alarm set   PT Visit Diagnosis: Difficulty in walking, not elsewhere classified (R26.2)     Time: 8295-6213 PT Time Calculation (min) (ACUTE ONLY): 13 min  Charges:    $Gait Training: 8-22 mins PT General Charges $$ ACUTE PT VISIT: 1 Visit                    Paulino Door, DPT Physical Therapist Acute Rehabilitation Services Office: [phone]    Janan Halter [place] 09/08/2022, 2:56 PM

## 2022-09-08 NOTE — Discharge Summary (Signed)
Physician Discharge Summary  Justin Suarez:[id number] DOB: [birth year] DOA: 09/06/2022  PCP: [name], MD  Admit date: 09/06/2022 Discharge date: 09/08/2022  Time spent: 40 minutes  Recommendations for Outpatient Follow-up:  Eliquis transition to aspirin as per orthopedic instructions outpatient follow-up with Dr. 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 Marina Goodell? HTN   Came from home 08/2015 a.m. after having a fall in elevator denies hitting head-landed on left hip Found to have intertrochanteric hip fracture-orthopedics Dr. Yevette Edwards consulted patient kept n.p.o.-Rx morphine Ativan nicotine patch fentanyl etc. in ED-CT head negative for intracranial injury DG hip = intertrochanteric minimally displaced fracture left hip CXR no active disease Hemoglobin 12.5 WBC 7.8 platelet 213--sodium 131 chloride 19 INR 1.0   7/18 IM nail Dr. Charlann Boxer  patient did remarkably well postoperatively and was able to ambulate with crutches and demonstrate stability-therapy recommended home health Will go home on stepwise downward titration of Eliquis for 2 weeks to aspirin twice daily subsequently totaling 1 month of  DVT prophylaxis as per Dr. Charlann Boxer Will discharge on minimal opiates as his pain is well-controlled we will continue Tylenol for mainstay of pain control His blood pressure was uncontrolled he is noncompliant on several meds and was only on B12 at home He will be given a prescription of metoprolol twice daily will need close monitoring of labs and regular preventative care for 67 year old as an outpatient but was discharged in a very stable state home with home health   Discharge Exam: Vitals:   09/08/22 1012 09/08/22 1350  BP: 129/66 137/75  Pulse: 65 63  Resp:  16  Temp: 98.5 F (36.9 C) 97.8 F (36.6 C)  SpO2: 100% 100%    Subj on day of d/c   Awake coherent pleasant no distress looks comfortable feels well pain 1/10  General Exam on discharge  Chest clear no wheeze rales rhonchi no icterus no pallor neck soft supple no lower extremity edema has some tenderness over lower bandage of left lower extremity which may be from insertion of the nail but otherwise coherent and pleasant  Discharge Instructions   Discharge Instructions     DME Crutches   Complete by: As directed    Diet - low sodium heart healthy   Complete by: As directed    Discharge instructions   Complete by: As directed    Continue careful movement with crutches--we will ask therapy to come out to your home to make sure that u receive the right teaching Make sure that you get labs in ~ 1 week at your regular doc.  Continue the blood thinner for 14 days then convert to aspirin 81 twice daily as per discussion   Discharge instructions   Complete by: As directed  Make sure that you do not mix your oxycodone with any alcohol as this can make you dizzy and make you fall  use 1000 mg Tylenol 3 times a day for mild pain-do not use for more than 5 to 7 days postoperatively Follow-up with orthopedics at their request Make sure that you take the blood thinner prescribed and follow instructions  Report high amounts of  pain, bleeding to orthopedics and or your regular doctor   Increase activity slowly   Complete by: As directed    Increase activity slowly   Complete by: As directed       Allergies as of 09/08/2022   No Known Allergies      Medication List     STOP taking these medications    B-12 PO       TAKE these medications    methocarbamol 500 MG tablet Commonly known as: ROBAXIN Take 1 tablet (500 mg total) by mouth every 6 (six) hours as needed for muscle spasms.   metoprolol tartrate 25 MG tablet Commonly known as: LOPRESSOR Take 0.5 tablets (12.5 mg total) by mouth 2 (two) times daily.   oxyCODONE 5 MG immediate release tablet Commonly known as: Oxy IR/ROXICODONE Take 1 tablet (5 mg total) by mouth every 4 (four) hours as needed for severe pain.   polyethylene glycol 17 g packet Commonly known as: MIRALAX / GLYCOLAX Take 17 g by mouth 2 (two) times daily.   rivaroxaban 10 MG Tabs tablet Commonly known as: XARELTO Take 1 tablet (10 mg total) by mouth daily with breakfast for 14 days. After completing, take aspirin 81 mg twice daily for 1 month   senna-docusate 8.6-50 MG tablet Commonly known as: Senokot-S Take 2 tablets by mouth at bedtime as needed for up to 14 days for mild constipation.               Durable Medical Equipment  (From admission, onward)           Start     Ordered   09/08/22 0000  DME Crutches        09/08/22 1412           No Known Allergies  Follow-up Information     Durene Romans, MD. Schedule an appointment as soon as possible for a visit in 2 week(s).   Specialty: Orthopedic Surgery Contact information: [address] [place] [address] [phone]         Care, [facility] Follow up.   Specialty: Home Health Services Why: [facility] will provide PT in the home after discharge. Contact information: [address] [place] [address] [phone]                   The results of significant diagnostics from this hospitalization (including imaging, microbiology, ancillary and laboratory) are listed below for reference.    Significant Diagnostic Studies: DG HIP UNILAT WITH PELVIS 2-3 VIEWS LEFT  Result Date: 09/07/2022 CLINICAL DATA:  Intramedullary nail placement EXAM: DG HIP (WITH OR WITHOUT PELVIS) 3V LEFT COMPARISON:  None Available. FINDINGS: Three fluoroscopic images obtained during intramedullary nail placement. 24 seconds fluoro time utilized. Radiation dose 4.4398 mGy Kerma. Please see performing physicians operative report for full details IMPRESSION: Fluoroscopic images were obtained for intraoperative guidance of left femoral intramedullary nail placement. Electronically Signed   By: Agustin Cree M.D.   On: 09/07/2022 15:24   DG C-Arm 1-60 Min-No Report  Result Date: 09/07/2022 Fluoroscopy was utilized  by the requesting physician.  No radiographic interpretation.   CT Head Wo Contrast  Result Date: 09/06/2022 CLINICAL DATA:  Fall in an Engineer, structural. Intoxication. Minor head trauma. EXAM: CT HEAD WITHOUT CONTRAST TECHNIQUE: Contiguous axial images were obtained from the base of the skull through the vertex without intravenous contrast. RADIATION DOSE REDUCTION: This exam was performed according to the departmental dose-optimization program which includes automated exposure control, adjustment of the mA and/or kV according to patient size and/or use of iterative reconstruction technique. COMPARISON:  None Available. FINDINGS: Brain: No evidence of acute infarction, hemorrhage, hydrocephalus, extra-axial collection or mass lesion/mass effect. Dilated perivascular space below the right putamen. Vascular: No hyperdense vessel or unexpected calcification. Skull: Normal. Negative for fracture or focal lesion. Sinuses/Orbits: No evidence of injury IMPRESSION: No evidence of intracranial injury. Electronically Signed   By: Tiburcio Pea M.D.   On: 09/06/2022  05:13   DG Hip Unilat W or Wo Pelvis 2-3 Views Left  Result Date: 09/06/2022 CLINICAL DATA:  Fall, left hip pain EXAM: DG HIP (WITH OR WITHOUT PELVIS) 2-3V LEFT COMPARISON:  None Available. FINDINGS: There is an acute, minimally displaced, anatomically aligned intratrochanteric fracture of the left hip with fracture fragments in near anatomic alignment. Left femoral head is still seated within the left acetabulum. Pelvis and visualized proximal right femur are intact. Mild bilateral degenerative hip arthritis. Vascular calcifications noted. IMPRESSION: 1. Acute, minimally displaced, anatomically aligned intratrochanteric fracture of the left hip. Electronically Signed   By: Helyn Numbers M.D.   On: 09/06/2022 01:30   DG Chest Portable 1 View  Result Date: 09/06/2022 CLINICAL DATA:  Fall, hip fracture, preoperative clearance for surgery. EXAM: PORTABLE CHEST 1 VIEW COMPARISON:  11/18/2020 FINDINGS: The heart size and mediastinal contours are within normal limits. Both lungs are clear. The visualized skeletal structures are unremarkable. IMPRESSION: No active disease. Electronically Signed   By: Helyn Numbers M.D.   On: 09/06/2022 01:28    Microbiology: Recent Results (from the past 240 hour(s))  Surgical PCR screen     Status: None   Collection Time: 09/06/22  7:49 AM   Specimen: Nasal Mucosa; Nasal Swab  Result Value Ref Range Status   MRSA, PCR NEGATIVE NEGATIVE Final   Staphylococcus aureus NEGATIVE NEGATIVE Final    Comment: (NOTE) The Xpert SA Assay (FDA approved for NASAL specimens in patients 42 years of age and older), is one component of a comprehensive surveillance program. It is not intended to diagnose infection nor to guide or monitor treatment. Performed at [facility], [address], [place], [place] [address]      Labs: Basic Metabolic Panel: Recent Labs  Lab 09/06/22 0100 09/06/22 0142 09/07/22 0400 09/08/22 0402  NA 131* 134* 135 130*  K  3.6 3.8 4.6 4.4  CL 101 97* 101 99  CO2 19*  --  29 26  GLUCOSE 96 99 109* 142*  BUN 7* 4* 13 12  CREATININE 0.68 1.00 0.83 0.76  CALCIUM 8.0*  --  8.4* 8.0*  MG 1.8  --   --   --   PHOS 3.6  --   --   --    Liver Function Tests: Recent Labs  Lab 09/06/22 0100  AST 30  ALT 31  ALKPHOS 106  BILITOT 0.5  PROT 6.7  ALBUMIN 3.7   No results for input(s): "LIPASE", "AMYLASE" in the last 168 hours. No results for input(s): "AMMONIA" in the last 168 hours. CBC: Recent Labs  Lab 09/06/22  0100 09/06/22 0142 09/07/22 0400 09/08/22 0402  WBC 7.8  --  3.9* 6.0  NEUTROABS 4.7  --  1.8  --   HGB 12.5* 12.6* 11.8* 10.9*  HCT 37.0* 37.0* 35.4* 33.1*  MCV 94.4  --  95.4 94.8  PLT 213  --  153 145*   Cardiac Enzymes: No results for input(s): "CKTOTAL", "CKMB", "CKMBINDEX", "TROPONINI" in the last 168 hours. BNP: BNP (last 3 results) No results for input(s): "BNP" in the last 8760 hours.  ProBNP (last 3 results) No results for input(s): "PROBNP" in the last 8760 hours.  CBG: No results for input(s): "GLUCAP" in the last 168 hours.     Signed:  Rhetta Mura MD   [facility] 09/08/2022, 2:45 PM

## 2022-09-08 NOTE — Progress Notes (Signed)
Subjective: 1 Day Post-Op Procedure(s) (LRB): INTRAMEDULLARY (IM) NAIL INTERTROCHANTERIC (Left) Patient reports pain as mild.   Patient seen in rounds for Dr. Charlann Boxer. Patient is resting in bed on exam this morning. No acute events overnight. Patient has not been up with PT yet.  We will start therapy today.   Objective: Vital signs in last 24 hours: Temp:  [97.6 F (36.4 C)-98.4 F (36.9 C)] 98 F (36.7 C) (07/19 0550) Pulse Rate:  [51-85] 67 (07/19 0550) Resp:  [11-18] 17 (07/19 0550) BP: (126-191)/(57-82) 150/77 (07/19 0550) SpO2:  [94 %-100 %] 99 % (07/19 0550) Weight:  [72.8 kg] 72.8 kg (07/18 1133)  Intake/Output from previous day:  Intake/Output Summary (Last 24 hours) at 09/08/2022 0755 Last data filed at 09/08/2022 0749 Gross per 24 hour  Intake 2953.29 ml  Output 1790 ml  Net 1163.29 ml     Intake/Output this shift: Total I/O In: 120 [P.O.:120] Out: 400 [Urine:400]  Labs: Recent Labs    09/06/22 0100 09/06/22 0142 09/07/22 0400 09/08/22 0402  HGB 12.5* 12.6* 11.8* 10.9*   Recent Labs    09/07/22 0400 09/08/22 0402  WBC 3.9* 6.0  RBC 3.71* 3.49*  HCT 35.4* 33.1*  PLT 153 145*   Recent Labs    09/07/22 0400 09/08/22 0402  NA 135 130*  K 4.6 4.4  CL 101 99  CO2 29 26  BUN 13 12  CREATININE 0.83 0.76  GLUCOSE 109* 142*  CALCIUM 8.4* 8.0*   Recent Labs    09/06/22 0210 09/07/22 0400  INR 1.0 1.1    Exam: General - Patient is Alert and Oriented Extremity - Neurologically intact Sensation intact distally Intact pulses distally Dorsiflexion/Plantar flexion intact Dressing - dressing C/D/I Motor Function - intact, moving foot and toes well on exam.   Past Medical History:  Diagnosis Date   Abnormal CT scan, chest 06/2011   multiple lung nodules; needs repeat study in 3 to 12 months.   Arthritis    Hands   Chronic anticoagulation    Common bile duct dilation    COPD (chronic obstructive pulmonary disease) (HCC)    Pt denies    DVT (deep venous thrombosis) (HCC)    Gallstone pancreatitis    GERD (gastroesophageal reflux disease)    Hepatitis C    History of hiatal hernia    HTN (hypertension)    Noncompliance    Pancreatitis    Pancreatitis 06/2020   Pulmonary embolus (HCC)    Tick bite 07/2011   Tobacco abuse     Assessment/Plan: 1 Day Post-Op Procedure(s) (LRB): INTRAMEDULLARY (IM) NAIL INTERTROCHANTERIC (Left) Principal Problem:   Hip fracture (HCC) Active Problems:   Tobacco abuse   Long term (current) use of anticoagulants   COPD (chronic obstructive pulmonary disease) (HCC)   Essential hypertension, benign   Mixed hyperlipidemia   Hepatitis C test positive   Intertrochanteric fracture of left femur, closed, initial encounter (HCC)  Estimated body mass index is 22.38 kg/m as calculated from the following:   Height as of this encounter: 5\' 11"  (1.803 m).   Weight as of this encounter: 72.8 kg. Advance diet Up with therapy  DVT Prophylaxis - Xarelto 10 mg every day x2 weeks, then Aspirin 81 mg chewable BID x 4 weeks due to hx of DVT/PE  PWB 50%  Pain is well controlled  Up with PT today  Discharge home to girlfriend's house when meeting goals with PT  Will need rolling walker - I will place order  today   Rosalene Billings, PA-C Orthopedic Surgery [phone] 09/08/2022, 7:55 AM

## 2022-09-11 ENCOUNTER — Telehealth: Payer: Self-pay | Admitting: *Deleted

## 2022-09-11 ENCOUNTER — Encounter: Payer: Self-pay | Admitting: *Deleted

## 2022-09-11 NOTE — Transitions of Care (Post Inpatient/ED Visit) (Signed)
   09/11/2022  Name: Justin Suarez MRN: [id number] DOB: [birth year]  Today's TOC FU Call Status: Today's TOC FU Call Status:: Unsuccessul Call (1st Attempt) Unsuccessful Call (1st Attempt) Date: 09/11/22  Attempted to reach the patient regarding the most recent Inpatient visit; received automated outgoing voice message stating tha patient "has a voice mailbox that has not been set up yet;" unable to leave message requesting call back   Follow Up Plan: Additional outreach attempts will be made to reach the patient to complete the Transitions of Care (Post Inpatient visit) call.   Caryl Pina, RN, BSN, CCRN Alumnus RN CM Care Coordination/ Transition of Care- [facility] Care Management [phone]: direct office

## 2022-09-12 ENCOUNTER — Encounter: Payer: Self-pay | Admitting: *Deleted

## 2022-09-12 ENCOUNTER — Telehealth: Payer: Self-pay | Admitting: *Deleted

## 2022-09-12 NOTE — Transitions of Care (Post Inpatient/ED Visit) (Signed)
09/12/2022  Name: Justin Suarez MRN: [id number] DOB: [birth year]  Today's TOC FU Call Status: Today's TOC FU Call Status:: Successful TOC FU Call Competed TOC FU Call Complete Date: 09/12/22  Transition Care Management Follow-up Telephone Call Date of Discharge: 09/08/22 Discharge Facility: [facility]) Type of Discharge: Inpatient Admission Primary Inpatient Discharge Diagnosis:: (L) hip fracture with surgical repair How have you been since you were released from the hospital?: Better Any questions or concerns?: No  Items Reviewed: Did you receive and understand the discharge instructions provided?: Yes (thoroughly reviewed with patient who verbalizes good understanding of same) Medications obtained,verified, and reconciled?: Yes (Medications Reviewed) (Full medication reconciliation/ review completed; no concerns or discrepancies identified; confirmed patient obtained/ is taking all newly Rx'd medications as instructed; self-manages medications and denies questions/ concerns around medications today) Any new allergies since your discharge?: No Dietary orders reviewed?: Yes Type of Diet Ordered:: "Healthy" Do you have support at home?: Yes People in Home: significant other Name of Support/Comfort Primary Source: Reports essentially independent in self-care activities; reports his girlfriend is currently residing with him and is assisting as/ if needed/ indicated  Medications Reviewed Today: Medications Reviewed Today     Reviewed by Michaela Corner, RN (Registered Nurse) on 09/12/22 at 1300  Med List Status: <None>   Medication Order Taking? Sig Documenting Provider Last Dose Status Informant  methocarbamol (ROBAXIN) 500 MG tablet [id number] Yes Take 1 tablet (500 mg total) by mouth every 6 (six) hours as needed for muscle spasms. Cassandria Anger, PA-C Taking Active   metoprolol tartrate (LOPRESSOR) 25 MG tablet [id number] Yes Take 0.5 tablets (12.5 mg total) by mouth 2 (two)  times daily. Rhetta Mura, MD Taking Active   oxyCODONE (OXY IR/ROXICODONE) 5 MG immediate release tablet [id number] Yes Take 1 tablet (5 mg total) by mouth every 4 (four) hours as needed for severe pain. Cassandria Anger, PA-C Taking Active   polyethylene glycol (MIRALAX / GLYCOLAX) 17 g packet [id number] Yes Take 17 g by mouth 2 (two) times daily. Cassandria Anger, PA-C Taking Active   rivaroxaban (XARELTO) 10 MG TABS tablet [id number] Yes Take 1 tablet (10 mg total) by mouth daily with breakfast for 14 days. After completing, take aspirin 81 mg twice daily for 1 month Cassandria Anger, PA-C Taking Active   senna-docusate (SENOKOT-S) 8.6-50 MG tablet [id number] Yes Take 2 tablets by mouth at bedtime as needed for up to 14 days for mild constipation. Cassandria Anger, PA-C Taking Active            Home Care and Equipment/Supplies: Were Home Health Services Ordered?: Yes [facility] of [facility] Health Agency:: [facility] Has Agency set up a time to come to your home?: Yes First Home Health Visit Date: 09/11/22 Any new equipment or medical supplies ordered?: No  Functional Questionnaire: Do you need assistance with bathing/showering or dressing?: No (girlfriend assisting as indicated post-recent surgery) Do you need assistance with meal preparation?: No (girlfriend assisting as indicated post-recent surgery) Do you need assistance with eating?: No Do you have difficulty maintaining continence: No Do you need assistance with getting out of bed/getting out of a chair/moving?: No (girlfriend assisting as indicated post-recent surgery) Do you have difficulty managing or taking your medications?: No (girlfriend assisting as indicated post-recent surgery)  Follow up appointments reviewed: PCP Follow-up appointment confirmed?: NA (verified not indicated per hospital discharging provider discharge notes- patient confirms he has contact information for PCP and will call to schedule after he sees  surgeon on 09/20/22) Specialist Hospital Follow-up appointment confirmed?: Yes Date of Specialist follow-up appointment?: 09/20/22 Follow-Up Specialty Provider:: orthopedic surgeon- [facility]- Dr. Charlann Boxer Do you need transportation to your follow-up appointment?: No Do you understand care options if your condition(s) worsen?: Yes-patient verbalized understanding  SDOH Interventions Today    Flowsheet Row Most Recent Value  SDOH Interventions   Food Insecurity Interventions Intervention Not Indicated  Transportation Interventions Intervention Not Indicated  [girlfriend provides transportation]      TOC Interventions Today    Flowsheet Row Most Recent Value  TOC Interventions   TOC Interventions Discussed/Reviewed TOC Interventions Discussed  [Patient declines need for ongoing/ further care coordination outreach,  no care coordination needs identified at time of TOC call today,  provided my direct contact information should questions/ concerns/ needs arise post-TOC call]      Interventions Today    Flowsheet Row Most Recent Value  Chronic Disease   Chronic disease during today's visit Other  [(L) hip fracture with surgical repair]  General Interventions   General Interventions Discussed/Reviewed General Interventions Discussed, Durable Medical Equipment (DME), Doctor Visits  Doctor Visits Discussed/Reviewed Doctor Visits Discussed, PCP, Specialist  Durable Medical Equipment (DME) Other  [currently using crutches]  PCP/Specialist Visits Compliance with follow-up visit  Exercise Interventions   Exercise Discussed/Reviewed Exercise Discussed  [home health PT services]  Education Interventions   Education Provided Provided Education  Provided Verbal Education On Medication  [side effects of narcotic pain medicine]  Nutrition Interventions   Nutrition Discussed/Reviewed Nutrition Discussed  Pharmacy Interventions   Pharmacy Dicussed/Reviewed Pharmacy Topics Discussed  [Full  medication review with updating medication list in EHR per patient report]  Safety Interventions   Safety Discussed/Reviewed Safety Discussed      Caryl Pina, RN, BSN, CCRN Alumnus RN CM Care Coordination/ Transition of Care- [facility] Care Management [phone]: direct office

## 2022-09-13 ENCOUNTER — Telehealth: Payer: Self-pay | Admitting: Family Medicine

## 2022-09-13 NOTE — Telephone Encounter (Signed)
Tried to call pt to get an overdue appt schedule. No answer and no voicemail set up

## 2022-09-14 ENCOUNTER — Inpatient Hospital Stay (HOSPITAL_COMMUNITY)
Admission: EM | Admit: 2022-09-14 | Discharge: 2022-09-20 | DRG: 987 | Disposition: A | Discharge status: Home Health | Payer: 59 | Attending: Internal Medicine | Admitting: Internal Medicine

## 2022-09-14 ENCOUNTER — Encounter (HOSPITAL_COMMUNITY): Payer: Self-pay | Admitting: Emergency Medicine

## 2022-09-14 DIAGNOSIS — E876 Hypokalemia: Secondary | ICD-10-CM | POA: Diagnosis not present

## 2022-09-14 DIAGNOSIS — K852 Alcohol induced acute pancreatitis without necrosis or infection: Secondary | ICD-10-CM | POA: Diagnosis not present

## 2022-09-14 DIAGNOSIS — J449 Chronic obstructive pulmonary disease, unspecified: Secondary | ICD-10-CM | POA: Diagnosis present

## 2022-09-14 DIAGNOSIS — K831 Obstruction of bile duct: Secondary | ICD-10-CM | POA: Diagnosis not present

## 2022-09-14 DIAGNOSIS — R1111 Vomiting without nausea: Secondary | ICD-10-CM | POA: Diagnosis not present

## 2022-09-14 DIAGNOSIS — I2510 Atherosclerotic heart disease of native coronary artery without angina pectoris: Secondary | ICD-10-CM | POA: Diagnosis not present

## 2022-09-14 DIAGNOSIS — F17210 Nicotine dependence, cigarettes, uncomplicated: Secondary | ICD-10-CM | POA: Diagnosis present

## 2022-09-14 DIAGNOSIS — Z8249 Family history of ischemic heart disease and other diseases of the circulatory system: Secondary | ICD-10-CM

## 2022-09-14 DIAGNOSIS — K859 Acute pancreatitis without necrosis or infection, unspecified: Secondary | ICD-10-CM | POA: Diagnosis present

## 2022-09-14 DIAGNOSIS — Z79899 Other long term (current) drug therapy: Secondary | ICD-10-CM

## 2022-09-14 DIAGNOSIS — I161 Hypertensive emergency: Secondary | ICD-10-CM | POA: Diagnosis not present

## 2022-09-14 DIAGNOSIS — I160 Hypertensive urgency: Secondary | ICD-10-CM | POA: Diagnosis present

## 2022-09-14 DIAGNOSIS — Z86711 Personal history of pulmonary embolism: Secondary | ICD-10-CM | POA: Diagnosis not present

## 2022-09-14 DIAGNOSIS — Z89022 Acquired absence of left finger(s): Secondary | ICD-10-CM

## 2022-09-14 DIAGNOSIS — I10 Essential (primary) hypertension: Secondary | ICD-10-CM | POA: Diagnosis present

## 2022-09-14 DIAGNOSIS — Z841 Family history of disorders of kidney and ureter: Secondary | ICD-10-CM | POA: Diagnosis not present

## 2022-09-14 DIAGNOSIS — R112 Nausea with vomiting, unspecified: Secondary | ICD-10-CM | POA: Diagnosis not present

## 2022-09-14 DIAGNOSIS — I700 Atherosclerosis of aorta: Secondary | ICD-10-CM | POA: Diagnosis not present

## 2022-09-14 DIAGNOSIS — R1011 Right upper quadrant pain: Secondary | ICD-10-CM | POA: Diagnosis not present

## 2022-09-14 DIAGNOSIS — Z833 Family history of diabetes mellitus: Secondary | ICD-10-CM | POA: Diagnosis not present

## 2022-09-14 DIAGNOSIS — Z743 Need for continuous supervision: Secondary | ICD-10-CM | POA: Diagnosis not present

## 2022-09-14 DIAGNOSIS — I2489 Other forms of acute ischemic heart disease: Secondary | ICD-10-CM | POA: Diagnosis not present

## 2022-09-14 DIAGNOSIS — R9431 Abnormal electrocardiogram [ECG] [EKG]: Secondary | ICD-10-CM | POA: Diagnosis not present

## 2022-09-14 DIAGNOSIS — Z86718 Personal history of other venous thrombosis and embolism: Secondary | ICD-10-CM | POA: Diagnosis not present

## 2022-09-14 DIAGNOSIS — K219 Gastro-esophageal reflux disease without esophagitis: Secondary | ICD-10-CM | POA: Diagnosis not present

## 2022-09-14 DIAGNOSIS — K760 Fatty (change of) liver, not elsewhere classified: Secondary | ICD-10-CM | POA: Diagnosis present

## 2022-09-14 DIAGNOSIS — R109 Unspecified abdominal pain: Secondary | ICD-10-CM | POA: Diagnosis not present

## 2022-09-14 DIAGNOSIS — F1020 Alcohol dependence, uncomplicated: Secondary | ICD-10-CM | POA: Diagnosis present

## 2022-09-14 DIAGNOSIS — Z7901 Long term (current) use of anticoagulants: Secondary | ICD-10-CM | POA: Diagnosis not present

## 2022-09-14 DIAGNOSIS — R6889 Other general symptoms and signs: Secondary | ICD-10-CM | POA: Diagnosis not present

## 2022-09-14 DIAGNOSIS — K860 Alcohol-induced chronic pancreatitis: Secondary | ICD-10-CM | POA: Diagnosis present

## 2022-09-14 DIAGNOSIS — B1920 Unspecified viral hepatitis C without hepatic coma: Secondary | ICD-10-CM | POA: Diagnosis present

## 2022-09-14 DIAGNOSIS — R1013 Epigastric pain: Secondary | ICD-10-CM | POA: Diagnosis not present

## 2022-09-14 DIAGNOSIS — K8591 Acute pancreatitis with uninfected necrosis, unspecified: Secondary | ICD-10-CM | POA: Diagnosis not present

## 2022-09-14 LAB — URINALYSIS, W/ REFLEX TO CULTURE (INFECTION SUSPECTED)
Bacteria, UA: NONE SEEN
Bilirubin Urine: NEGATIVE
Glucose, UA: NEGATIVE mg/dL
Hgb urine dipstick: NEGATIVE
Ketones, ur: 20 mg/dL — AB
Leukocytes,Ua: NEGATIVE
Nitrite: NEGATIVE
Protein, ur: NEGATIVE mg/dL
Specific Gravity, Urine: 1.016 (ref 1.005–1.030)
pH: 7 (ref 5.0–8.0)

## 2022-09-14 LAB — CBC WITH DIFFERENTIAL/PLATELET
Abs Immature Granulocytes: 0.02 10*3/uL (ref 0.00–0.07)
Basophils Absolute: 0 10*3/uL (ref 0.0–0.1)
Basophils Relative: 0 %
Eosinophils Absolute: 0 10*3/uL (ref 0.0–0.5)
Eosinophils Relative: 0 %
HCT: 36.2 % — ABNORMAL LOW (ref 39.0–52.0)
Hemoglobin: 12.2 g/dL — ABNORMAL LOW (ref 13.0–17.0)
Immature Granulocytes: 0 %
Lymphocytes Relative: 8 %
Lymphs Abs: 0.9 10*3/uL (ref 0.7–4.0)
MCH: 31.4 pg (ref 26.0–34.0)
MCHC: 33.7 g/dL (ref 30.0–36.0)
MCV: 93.1 fL (ref 80.0–100.0)
Monocytes Absolute: 1 10*3/uL (ref 0.1–1.0)
Monocytes Relative: 9 %
Neutro Abs: 9.4 10*3/uL — ABNORMAL HIGH (ref 1.7–7.7)
Neutrophils Relative %: 83 %
Platelets: 335 10*3/uL (ref 150–400)
RBC: 3.89 MIL/uL — ABNORMAL LOW (ref 4.22–5.81)
RDW: 13.6 % (ref 11.5–15.5)
WBC: 11.3 10*3/uL — ABNORMAL HIGH (ref 4.0–10.5)
nRBC: 0 % (ref 0.0–0.2)

## 2022-09-14 MED ORDER — HYDROMORPHONE HCL 1 MG/ML IJ SOLN
0.5000 mg | Freq: Once | INTRAMUSCULAR | Status: AC
  Administered 2022-09-14: 0.5 mg via INTRAVENOUS
  Filled 2022-09-14: qty 1

## 2022-09-14 MED ORDER — HYDROMORPHONE HCL 1 MG/ML IJ SOLN
1.0000 mg | Freq: Once | INTRAMUSCULAR | Status: AC
  Administered 2022-09-14: 1 mg via INTRAVENOUS
  Filled 2022-09-14: qty 1

## 2022-09-14 MED ORDER — ONDANSETRON HCL 4 MG/2ML IJ SOLN
4.0000 mg | Freq: Once | INTRAMUSCULAR | Status: AC
  Administered 2022-09-14: 4 mg via INTRAVENOUS
  Filled 2022-09-14: qty 2

## 2022-09-14 NOTE — ED Provider Notes (Incomplete)
  [place] EMERGENCY DEPARTMENT AT [facility] Provider Note   CSN: [id number] Arrival date & time: 09/14/22  2209     History {Add pertinent medical, surgical, social history, OB history to HPI:1} Chief Complaint  Patient presents with  . Abdominal Pain    Justin Suarez is a 67 y.o. male.  HPI     Home Medications Prior to Admission medications   Medication Sig Start Date End Date Taking? Authorizing Provider  methocarbamol (ROBAXIN) 500 MG tablet Take 1 tablet (500 mg total) by mouth every 6 (six) hours as needed for muscle spasms. 09/08/22   Cassandria Anger, PA-C  metoprolol tartrate (LOPRESSOR) 25 MG tablet Take 0.5 tablets (12.5 mg total) by mouth 2 (two) times daily. 09/08/22   Rhetta Mura, MD  oxyCODONE (OXY IR/ROXICODONE) 5 MG immediate release tablet Take 1 tablet (5 mg total) by mouth every 4 (four) hours as needed for severe pain. 09/08/22   Cassandria Anger, PA-C  polyethylene glycol (MIRALAX / GLYCOLAX) 17 g packet Take 17 g by mouth 2 (two) times daily. 09/08/22   Cassandria Anger, PA-C  rivaroxaban (XARELTO) 10 MG TABS tablet Take 1 tablet (10 mg total) by mouth daily with breakfast for 14 days. After completing, take aspirin 81 mg twice daily for 1 month 09/08/22 09/22/22  Cassandria Anger, PA-C  senna-docusate (SENOKOT-S) 8.6-50 MG tablet Take 2 tablets by mouth at bedtime as needed for up to 14 days for mild constipation. 09/08/22 09/22/22  Cassandria Anger, PA-C      Allergies    Patient has no known allergies.    Review of Systems   Review of Systems  Physical Exam Updated Vital Signs BP (!) 222/97 (BP Location: Left Arm)   Temp 97.7 F (36.5 C) (Oral)   Resp (!) 21   SpO2 95%  Physical Exam  ED Results / Procedures / Treatments   Labs (all labs ordered are listed, but only abnormal results are displayed) Labs Reviewed - No data to display  EKG None  Radiology No results found.  Procedures Procedures  {Document cardiac  monitor, telemetry assessment procedure when appropriate:1}  Medications Ordered in ED Medications - No data to display  ED Course/ Medical Decision Making/ A&P   {   Click here for ABCD2, HEART and other calculatorsREFRESH Note before signing :1}                          Medical Decision Making Amount and/or Complexity of Data Reviewed Labs: ordered.  Risk Prescription drug management.   ***  {Document critical care time when appropriate:1} {Document review of labs and clinical decision tools ie heart score, Chads2Vasc2 etc:1}  {Document your independent review of radiology images, and any outside records:1} {Document your discussion with family members, caretakers, and with consultants:1} {Document social determinants of health affecting pt's care:1} {Document your decision making why or why not admission, treatments were needed:1} Final Clinical Impression(s) / ED Diagnoses Final diagnoses:  None    Rx / DC Orders ED Discharge Orders     None

## 2022-09-14 NOTE — ED Provider Notes (Signed)
[place] EMERGENCY DEPARTMENT AT [facility] Provider Note   CSN: [id number] Arrival date & time: 09/14/22  2209     Histor Chief Complaint  Patient presents with   Abdominal Pain    Justin Suarez is a 67 y.o. male with history of chronic pancreatitis with persistent alcohol use as well as COPD who presents with concern for abdominal pain that started this evening around 730 that radiates into the back.  Pain is worse in epigastrium and right upper quadrant.  Vomiting x 2 with NBNB emesis.  No diarrhea, no fevers or chills.  Patient did have hip surgery on the left On 7/18 for intertrochanteric fracture with Dr. Constance Goltz.  States that his hip feels quite well at this time.  He is anticoagulated on Xarelto for history of PE.  Also with history of hep C which has been treated.  HPI     Home Medications Prior to Admission medications   Medication Sig Start Date End Date Taking? Authorizing Provider  methocarbamol (ROBAXIN) 500 MG tablet Take 1 tablet (500 mg total) by mouth every 6 (six) hours as needed for muscle spasms. 09/08/22   Cassandria Anger, PA-C  metoprolol tartrate (LOPRESSOR) 25 MG tablet Take 0.5 tablets (12.5 mg total) by mouth 2 (two) times daily. 09/08/22   Rhetta Mura, MD  oxyCODONE (OXY IR/ROXICODONE) 5 MG immediate release tablet Take 1 tablet (5 mg total) by mouth every 4 (four) hours as needed for severe pain. 09/08/22   Cassandria Anger, PA-C  polyethylene glycol (MIRALAX / GLYCOLAX) 17 g packet Take 17 g by mouth 2 (two) times daily. 09/08/22   Cassandria Anger, PA-C  rivaroxaban (XARELTO) 10 MG TABS tablet Take 1 tablet (10 mg total) by mouth daily with breakfast for 14 days. After completing, take aspirin 81 mg twice daily for 1 month 09/08/22 09/22/22  Cassandria Anger, PA-C  senna-docusate (SENOKOT-S) 8.6-50 MG tablet Take 2 tablets by mouth at bedtime as needed for up to 14 days for mild constipation. 09/08/22 09/22/22  Cassandria Anger, PA-C       Allergies    Patient has no known allergies.    Review of Systems   Review of Systems  Constitutional:  Positive for appetite change. Negative for chills, fatigue and fever.  HENT: Negative.    Respiratory: Negative.    Cardiovascular: Negative.   Gastrointestinal:  Positive for abdominal pain, nausea and vomiting. Negative for diarrhea.  Genitourinary: Negative.   Musculoskeletal: Negative.     Physical Exam Updated Vital Signs BP (!) 222/97 (BP Location: Left Arm)   Temp 97.7 F (36.5 C) (Oral)   Resp (!) 21   SpO2 95%  Physical Exam Vitals and nursing note reviewed.  Constitutional:      General: He is in acute distress (abdominal pain).     Appearance: He is not ill-appearing or toxic-appearing.  HENT:     Head: Normocephalic and atraumatic.     Mouth/Throat:     Mouth: Mucous membranes are moist.     Pharynx: No oropharyngeal exudate or posterior oropharyngeal erythema.  Eyes:     General:        Right eye: No discharge.        Left eye: No discharge.     Conjunctiva/sclera: Conjunctivae normal.  Cardiovascular:     Rate and Rhythm: Normal rate and regular rhythm.     Pulses: Normal pulses.     Heart sounds: Normal heart sounds. No murmur  heard. Pulmonary:     Effort: Pulmonary effort is normal. No respiratory distress.     Breath sounds: Normal breath sounds. No wheezing or rales.  Abdominal:     General: Bowel sounds are normal. There is no distension.     Palpations: Abdomen is soft.     Tenderness: There is abdominal tenderness in the right upper quadrant and epigastric area. There is no right CVA tenderness, left CVA tenderness or rebound.  Musculoskeletal:        General: No deformity.     Cervical back: Neck supple.       Legs:  Skin:    General: Skin is warm and dry.     Capillary Refill: Capillary refill takes less than 2 seconds.  Neurological:     General: No focal deficit present.     Mental Status: He is alert and oriented to person,  place, and time. Mental status is at baseline.  Psychiatric:        Mood and Affect: Mood normal.    ED Results / Procedures / Treatments   Labs (all labs ordered are listed, but only abnormal results are displayed) Labs Reviewed - No data to display  EKG None  Radiology No results found.  Procedures .Critical Care  Performed by: Paris Lore, PA-C Authorized by: Paris Lore, PA-C   Critical care provider statement:    Critical care time (minutes):  45   Critical care was time spent personally by me on the following activities:  Development of treatment plan with patient or surrogate, discussions with consultants, evaluation of patient's response to treatment, examination of patient, obtaining history from patient or surrogate, ordering and performing treatments and interventions, ordering and review of laboratory studies, ordering and review of radiographic studies, pulse oximetry and re-evaluation of patient's condition     Medications Ordered in ED Medications - No data to display  ED Course/ Medical Decision Making/ A&P Clinical Course as of 09/15/22 0308  Fri Sep 15, 2022  0306 Consulted Dr. Margo Aye, hospitalist was agreeable to admit patient to her service.  I appreciate her collaboration in the care of the patient. [RS]    Clinical Course User Index [RS] Sherrilee Gilles   FF2                          Medical Decision Making 67 year old male with history of chronic pancreatitis with present alcohol use who presents with concern for epigastric pain that radiates to the right upper quadrant and back.  Significantly hypertensive on intake, acute distress secondary to pain.  Improved after IV analgesia now 3/10.  Cardiopulmonary dam unremarkable, abdominal exam with exquisite right upper quadrant-palpation as well as epigastric tenderness to palpation.  Per chart review patient history of pancreatic pseudocyst.  No CVAT  Differential  diagnosis of epigastric pain includes but is not limited to: Functional or nonulcer dyspepsia (MCC), PUD, GERD, Gastritis, (NSAIDs, alcohol, stress, H. pylori, pernicious anemia), pancreatitis / pancreatic cancer, overeating indigestion (high-fat foods, coffee), drugs (aspirin, antibiotics (eg, macrolides, metronidazole), corticosteroids, digoxin, narcotics, theophylline), gastroparesis, gastric volvulus, gastric cancer, lactose intolerance, malabsorption, parasitic infection (Giardia, Strongyloides, Ascaris), abdominal hernia, intestinal ischemia, esophageal rupture,  cholelithiasis /choledocholithiasis / cholangitis, hepatitis, ACS, pericarditis, pneumonia, pregnancy.   Amount and/or Complexity of Data Reviewed Labs: ordered.    Details: CBC with leukocytosis with, mild anemia with hemoglobin of 12 near patient's baseline.  CMP with transaminitis with AST/ALT 67/96.  Elevated alk  phos to 294.  UA with ketonuria otherwise unremarkable.  Lipase elevated to 556 increased from patient's baseline near 40.  Troponin initially 14, subsequently 43. Radiology: ordered.  Risk Prescription drug management. Decision regarding hospitalization.   Patient administered 5 labetalol IV for ACS hypertension with uptrending troponin, hypertensive emergency.  No response following labetalol, given patient's intermittent bradycardia to the 50s, do not feel any further beta-blockade is appropriate.  Patient did take his metoprolol at home today.  Previously was amlodipine 10 mg this was administered oral in the emergency department today as well as Nitropaste.  Patient remains chest pain-free, and pain improved to 3/10 following fourth round of IV analgesia.  Do feel at this juncture that patient would benefit from mission to the hospital for both pancreatitis and for hypertension crisis.  Patient is still uncomfortable appearing though significantly improved from intake.  CT scan with stable pancreatic changes with  pseudocyst, stable CBD dilatation.  Mr. Schutter is amenable to plan for admission at this time.He voiced understanding of his medical evaluation and treatment plan. Each of their questions answered to their expressed satisfaction.  Return precautions were given.  This chart was dictated using voice recognition software, Dragon. Despite the best efforts of this provider to proofread and correct errors, errors may still occur which can change documentation meaning.   Final Clinical Impression(s) / ED Diagnoses Final diagnoses:  None    Rx / DC Orders ED Discharge Orders     None         Sherrilee Gilles 09/15/22 0310    Nira Conn, MD 09/15/22 [phone]

## 2022-09-14 NOTE — ED Triage Notes (Signed)
Pt had hip surgery on L hip thursday. Vomiting x 2 and nausea. Pain started 1930 started into epigastric area and pancreas and now RUQ under ribs radiating into back.

## 2022-09-15 ENCOUNTER — Inpatient Hospital Stay (HOSPITAL_COMMUNITY): Payer: 59

## 2022-09-15 ENCOUNTER — Other Ambulatory Visit: Payer: Self-pay

## 2022-09-15 ENCOUNTER — Encounter (HOSPITAL_COMMUNITY): Payer: Self-pay

## 2022-09-15 ENCOUNTER — Emergency Department (HOSPITAL_COMMUNITY): Payer: 59

## 2022-09-15 DIAGNOSIS — R188 Other ascites: Secondary | ICD-10-CM | POA: Diagnosis not present

## 2022-09-15 DIAGNOSIS — K8689 Other specified diseases of pancreas: Secondary | ICD-10-CM | POA: Diagnosis not present

## 2022-09-15 DIAGNOSIS — Z86718 Personal history of other venous thrombosis and embolism: Secondary | ICD-10-CM | POA: Diagnosis not present

## 2022-09-15 DIAGNOSIS — J449 Chronic obstructive pulmonary disease, unspecified: Secondary | ICD-10-CM | POA: Diagnosis present

## 2022-09-15 DIAGNOSIS — D649 Anemia, unspecified: Secondary | ICD-10-CM | POA: Diagnosis not present

## 2022-09-15 DIAGNOSIS — K859 Acute pancreatitis without necrosis or infection, unspecified: Secondary | ICD-10-CM | POA: Diagnosis not present

## 2022-09-15 DIAGNOSIS — R748 Abnormal levels of other serum enzymes: Secondary | ICD-10-CM | POA: Diagnosis not present

## 2022-09-15 DIAGNOSIS — I160 Hypertensive urgency: Secondary | ICD-10-CM | POA: Diagnosis present

## 2022-09-15 DIAGNOSIS — B1920 Unspecified viral hepatitis C without hepatic coma: Secondary | ICD-10-CM | POA: Diagnosis present

## 2022-09-15 DIAGNOSIS — R9431 Abnormal electrocardiogram [ECG] [EKG]: Secondary | ICD-10-CM | POA: Diagnosis not present

## 2022-09-15 DIAGNOSIS — R17 Unspecified jaundice: Secondary | ICD-10-CM | POA: Diagnosis not present

## 2022-09-15 DIAGNOSIS — R112 Nausea with vomiting, unspecified: Secondary | ICD-10-CM | POA: Diagnosis not present

## 2022-09-15 DIAGNOSIS — K831 Obstruction of bile duct: Secondary | ICD-10-CM | POA: Diagnosis not present

## 2022-09-15 DIAGNOSIS — F17210 Nicotine dependence, cigarettes, uncomplicated: Secondary | ICD-10-CM | POA: Diagnosis present

## 2022-09-15 DIAGNOSIS — I10 Essential (primary) hypertension: Secondary | ICD-10-CM | POA: Diagnosis present

## 2022-09-15 DIAGNOSIS — Z89022 Acquired absence of left finger(s): Secondary | ICD-10-CM | POA: Diagnosis not present

## 2022-09-15 DIAGNOSIS — K861 Other chronic pancreatitis: Secondary | ICD-10-CM | POA: Diagnosis not present

## 2022-09-15 DIAGNOSIS — I2489 Other forms of acute ischemic heart disease: Secondary | ICD-10-CM | POA: Diagnosis present

## 2022-09-15 DIAGNOSIS — K219 Gastro-esophageal reflux disease without esophagitis: Secondary | ICD-10-CM | POA: Diagnosis present

## 2022-09-15 DIAGNOSIS — K760 Fatty (change of) liver, not elsewhere classified: Secondary | ICD-10-CM | POA: Diagnosis present

## 2022-09-15 DIAGNOSIS — F1020 Alcohol dependence, uncomplicated: Secondary | ICD-10-CM | POA: Diagnosis present

## 2022-09-15 DIAGNOSIS — Z833 Family history of diabetes mellitus: Secondary | ICD-10-CM | POA: Diagnosis not present

## 2022-09-15 DIAGNOSIS — K8591 Acute pancreatitis with uninfected necrosis, unspecified: Secondary | ICD-10-CM | POA: Diagnosis not present

## 2022-09-15 DIAGNOSIS — R109 Unspecified abdominal pain: Secondary | ICD-10-CM | POA: Diagnosis not present

## 2022-09-15 DIAGNOSIS — I728 Aneurysm of other specified arteries: Secondary | ICD-10-CM | POA: Diagnosis not present

## 2022-09-15 DIAGNOSIS — Z8249 Family history of ischemic heart disease and other diseases of the circulatory system: Secondary | ICD-10-CM | POA: Diagnosis not present

## 2022-09-15 DIAGNOSIS — I70203 Unspecified atherosclerosis of native arteries of extremities, bilateral legs: Secondary | ICD-10-CM | POA: Diagnosis not present

## 2022-09-15 DIAGNOSIS — K852 Alcohol induced acute pancreatitis without necrosis or infection: Secondary | ICD-10-CM | POA: Diagnosis present

## 2022-09-15 DIAGNOSIS — K860 Alcohol-induced chronic pancreatitis: Secondary | ICD-10-CM | POA: Diagnosis present

## 2022-09-15 DIAGNOSIS — Z79899 Other long term (current) drug therapy: Secondary | ICD-10-CM | POA: Diagnosis not present

## 2022-09-15 DIAGNOSIS — I2510 Atherosclerotic heart disease of native coronary artery without angina pectoris: Secondary | ICD-10-CM | POA: Diagnosis present

## 2022-09-15 DIAGNOSIS — I700 Atherosclerosis of aorta: Secondary | ICD-10-CM | POA: Diagnosis not present

## 2022-09-15 DIAGNOSIS — Z86711 Personal history of pulmonary embolism: Secondary | ICD-10-CM | POA: Diagnosis not present

## 2022-09-15 DIAGNOSIS — Z7901 Long term (current) use of anticoagulants: Secondary | ICD-10-CM | POA: Diagnosis not present

## 2022-09-15 DIAGNOSIS — R1013 Epigastric pain: Secondary | ICD-10-CM | POA: Diagnosis not present

## 2022-09-15 DIAGNOSIS — Z841 Family history of disorders of kidney and ureter: Secondary | ICD-10-CM | POA: Diagnosis not present

## 2022-09-15 DIAGNOSIS — E876 Hypokalemia: Secondary | ICD-10-CM | POA: Diagnosis present

## 2022-09-15 DIAGNOSIS — R1011 Right upper quadrant pain: Secondary | ICD-10-CM | POA: Diagnosis not present

## 2022-09-15 LAB — CBC
HCT: 30.9 % — ABNORMAL LOW (ref 39.0–52.0)
Hemoglobin: 10.2 g/dL — ABNORMAL LOW (ref 13.0–17.0)
MCH: 31.8 pg (ref 26.0–34.0)
MCHC: 33 g/dL (ref 30.0–36.0)
MCV: 96.3 fL (ref 80.0–100.0)
Platelets: 230 10*3/uL (ref 150–400)
RBC: 3.21 MIL/uL — ABNORMAL LOW (ref 4.22–5.81)
RDW: 13.7 % (ref 11.5–15.5)
WBC: 7.4 10*3/uL (ref 4.0–10.5)
nRBC: 0 % (ref 0.0–0.2)

## 2022-09-15 LAB — COMPREHENSIVE METABOLIC PANEL
ALT: 90 U/L — ABNORMAL HIGH (ref 0–44)
AST: 117 U/L — ABNORMAL HIGH (ref 15–41)
Albumin: 2.9 g/dL — ABNORMAL LOW (ref 3.5–5.0)
Alkaline Phosphatase: 248 U/L — ABNORMAL HIGH (ref 38–126)
Anion gap: 13 (ref 5–15)
BUN: 9 mg/dL (ref 8–23)
CO2: 21 mmol/L — ABNORMAL LOW (ref 22–32)
Calcium: 8.2 mg/dL — ABNORMAL LOW (ref 8.9–10.3)
Chloride: 98 mmol/L (ref 98–111)
Creatinine, Ser: 0.48 mg/dL — ABNORMAL LOW (ref 0.61–1.24)
GFR, Estimated: >60 mL/min (ref >60)
Glucose, Bld: 124 mg/dL — ABNORMAL HIGH (ref 70–99)
Potassium: 4.4 mmol/L (ref 3.5–5.1)
Sodium: 132 mmol/L — ABNORMAL LOW (ref 135–145)
Total Bilirubin: 1 mg/dL (ref 0.3–1.2)
Total Protein: 5.8 g/dL — ABNORMAL LOW (ref 6.5–8.1)

## 2022-09-15 LAB — LIPASE, BLOOD: Lipase: 351 U/L — ABNORMAL HIGH (ref 11–51)

## 2022-09-15 LAB — ECHOCARDIOGRAM COMPLETE
AR max vel: 3.47 cm2
AV Area VTI: 3.4 cm2
AV Area mean vel: 3.29 cm2
AV Mean grad: 3 mmHg
AV Peak grad: 6 mmHg
Ao pk vel: 1.22 m/s
Area-P 1/2: 2.66 cm2
Calc EF: 57.7 %
MV VTI: 3.03 cm2
S' Lateral: 3.6 cm
Single Plane A2C EF: 56.7 %
Single Plane A4C EF: 58.1 %

## 2022-09-15 LAB — PHOSPHORUS: Phosphorus: 3.6 mg/dL (ref 2.5–4.6)

## 2022-09-15 LAB — MAGNESIUM: Magnesium: 1.5 mg/dL — ABNORMAL LOW (ref 1.7–2.4)

## 2022-09-15 LAB — TROPONIN I (HIGH SENSITIVITY): Troponin I (High Sensitivity): 43 ng/L — ABNORMAL HIGH (ref <18)

## 2022-09-15 MED ORDER — NITROGLYCERIN 2 % TD OINT
0.5000 [in_us] | TOPICAL_OINTMENT | Freq: Once | TRANSDERMAL | Status: AC
  Administered 2022-09-15: 0.5 [in_us] via TOPICAL
  Filled 2022-09-15: qty 1

## 2022-09-15 MED ORDER — LABETALOL HCL 5 MG/ML IV SOLN
5.0000 mg | Freq: Once | INTRAVENOUS | Status: AC
  Administered 2022-09-15: 5 mg via INTRAVENOUS
  Filled 2022-09-15: qty 4

## 2022-09-15 MED ORDER — HYDROMORPHONE HCL 1 MG/ML IJ SOLN
0.5000 mg | INTRAMUSCULAR | Status: DC | PRN
  Administered 2022-09-15 (×2): 0.5 mg via INTRAVENOUS
  Filled 2022-09-15: qty 1
  Filled 2022-09-15: qty 0.5

## 2022-09-15 MED ORDER — LORAZEPAM 2 MG/ML IJ SOLN: 1.0000 mg | INTRAMUSCULAR | Status: AC | PRN

## 2022-09-15 MED ORDER — ADULT MULTIVITAMIN W/MINERALS CH
1.0000 | ORAL_TABLET | Freq: Every day | ORAL | Status: DC
  Administered 2022-09-15 – 2022-09-20 (×6): 1 via ORAL
  Filled 2022-09-15 (×6): qty 1

## 2022-09-15 MED ORDER — POLYETHYLENE GLYCOL 3350 17 G PO PACK: 17.0000 g | PACK | Freq: Every day | ORAL | Status: DC | PRN

## 2022-09-15 MED ORDER — LACTATED RINGERS IV SOLN: INTRAVENOUS | Status: DC

## 2022-09-15 MED ORDER — HYDROMORPHONE HCL 1 MG/ML IJ SOLN
0.5000 mg | Freq: Once | INTRAMUSCULAR | Status: AC
  Administered 2022-09-15: 0.5 mg via INTRAVENOUS
  Filled 2022-09-15: qty 1

## 2022-09-15 MED ORDER — FOLIC ACID 1 MG PO TABS
1.0000 mg | ORAL_TABLET | Freq: Every day | ORAL | Status: DC
  Administered 2022-09-15 – 2022-09-20 (×6): 1 mg via ORAL
  Filled 2022-09-15 (×6): qty 1

## 2022-09-15 MED ORDER — HYDROMORPHONE HCL 1 MG/ML IJ SOLN
1.0000 mg | INTRAMUSCULAR | Status: DC | PRN
  Administered 2022-09-15 – 2022-09-17 (×9): 1 mg via INTRAVENOUS
  Filled 2022-09-15 (×9): qty 1

## 2022-09-15 MED ORDER — THIAMINE MONONITRATE 100 MG PO TABS
100.0000 mg | ORAL_TABLET | Freq: Every day | ORAL | Status: DC
  Administered 2022-09-15 – 2022-09-20 (×6): 100 mg via ORAL
  Filled 2022-09-15 (×6): qty 1

## 2022-09-15 MED ORDER — AMLODIPINE BESYLATE 5 MG PO TABS
10.0000 mg | ORAL_TABLET | Freq: Once | ORAL | Status: AC
  Administered 2022-09-15: 10 mg via ORAL
  Filled 2022-09-15: qty 2

## 2022-09-15 MED ORDER — MELATONIN 5 MG PO TABS
5.0000 mg | ORAL_TABLET | Freq: Every evening | ORAL | Status: DC | PRN
  Administered 2022-09-17 – 2022-09-19 (×2): 5 mg via ORAL
  Filled 2022-09-15 (×2): qty 1

## 2022-09-15 MED ORDER — OXYCODONE HCL 5 MG PO TABS
5.0000 mg | ORAL_TABLET | ORAL | Status: DC | PRN
  Administered 2022-09-15 – 2022-09-16 (×4): 10 mg via ORAL
  Administered 2022-09-17: 5 mg via ORAL
  Administered 2022-09-17: 10 mg via ORAL
  Administered 2022-09-18: 5 mg via ORAL
  Administered 2022-09-19 – 2022-09-20 (×2): 10 mg via ORAL
  Filled 2022-09-15 (×2): qty 1
  Filled 2022-09-15 (×7): qty 2

## 2022-09-15 MED ORDER — HYDRALAZINE HCL 20 MG/ML IJ SOLN: 5.0000 mg | Freq: Four times a day (QID) | INTRAMUSCULAR | Status: DC | PRN

## 2022-09-15 MED ORDER — MAGNESIUM SULFATE 2 GM/50ML IV SOLN
2.0000 g | Freq: Once | INTRAVENOUS | Status: AC
  Administered 2022-09-15: 2 g via INTRAVENOUS
  Filled 2022-09-15: qty 50

## 2022-09-15 MED ORDER — RIVAROXABAN 10 MG PO TABS
10.0000 mg | ORAL_TABLET | Freq: Every day | ORAL | Status: DC
  Administered 2022-09-15 – 2022-09-17 (×3): 10 mg via ORAL
  Filled 2022-09-15 (×5): qty 1

## 2022-09-15 MED ORDER — LORAZEPAM 1 MG PO TABS: 1.0000 mg | ORAL_TABLET | ORAL | Status: AC | PRN

## 2022-09-15 MED ORDER — THIAMINE HCL 100 MG/ML IJ SOLN
100.0000 mg | Freq: Every day | INTRAMUSCULAR | Status: DC
  Filled 2022-09-15 (×2): qty 2

## 2022-09-15 MED ORDER — HYDROMORPHONE HCL 1 MG/ML IJ SOLN: 0.5000 mg | INTRAMUSCULAR | Status: DC | PRN

## 2022-09-15 MED ORDER — ACETAMINOPHEN 325 MG PO TABS
650.0000 mg | ORAL_TABLET | Freq: Four times a day (QID) | ORAL | Status: DC | PRN
  Administered 2022-09-15 – 2022-09-20 (×4): 650 mg via ORAL
  Filled 2022-09-15 (×5): qty 2

## 2022-09-15 MED ORDER — SODIUM CHLORIDE 0.9 % IV BOLUS
1000.0000 mL | Freq: Once | INTRAVENOUS | Status: AC
  Administered 2022-09-15: 1000 mL via INTRAVENOUS

## 2022-09-15 MED ORDER — METOPROLOL TARTRATE 25 MG PO TABS
12.5000 mg | ORAL_TABLET | Freq: Two times a day (BID) | ORAL | Status: DC
  Administered 2022-09-15 – 2022-09-20 (×11): 12.5 mg via ORAL
  Filled 2022-09-15 (×11): qty 1

## 2022-09-15 MED ORDER — IOHEXOL 300 MG/ML  SOLN
100.0000 mL | Freq: Once | INTRAMUSCULAR | Status: AC | PRN
  Administered 2022-09-15: 100 mL via INTRAVENOUS

## 2022-09-15 MED ORDER — AMLODIPINE BESYLATE 10 MG PO TABS
10.0000 mg | ORAL_TABLET | Freq: Every day | ORAL | Status: DC
  Administered 2022-09-16 – 2022-09-20 (×5): 10 mg via ORAL
  Filled 2022-09-15 (×5): qty 1

## 2022-09-15 MED ORDER — HYDRALAZINE HCL 20 MG/ML IJ SOLN: 10.0000 mg | Freq: Once | INTRAMUSCULAR | Status: DC

## 2022-09-15 MED ORDER — OXYCODONE HCL 5 MG PO TABS
5.0000 mg | ORAL_TABLET | Freq: Four times a day (QID) | ORAL | Status: DC | PRN
  Administered 2022-09-15: 5 mg via ORAL
  Filled 2022-09-15: qty 1

## 2022-09-15 MED ORDER — SODIUM CHLORIDE (PF) 0.9 % IJ SOLN
INTRAMUSCULAR | Status: AC
  Filled 2022-09-15: qty 50

## 2022-09-15 MED ORDER — HYDRALAZINE HCL 20 MG/ML IJ SOLN
5.0000 mg | Freq: Once | INTRAMUSCULAR | Status: AC
  Administered 2022-09-15: 5 mg via INTRAVENOUS
  Filled 2022-09-15: qty 1

## 2022-09-15 MED ORDER — PROCHLORPERAZINE EDISYLATE 10 MG/2ML IJ SOLN: 5.0000 mg | Freq: Four times a day (QID) | INTRAMUSCULAR | Status: DC | PRN

## 2022-09-15 NOTE — H&P (Addendum)
History and Physical  Justin Suarez LKG:[id number] DOB: 09/18/1955 DOA: 09/14/2022  Referring physician: Loleta Dicker, PA-EDP  PCP: [name], MD  Outpatient Specialists: None Patient coming from: Home  Chief Complaint: Severe epigastric pain, nausea and vomiting x 1 today.  HPI: Justin Suarez is a 67 y.o. male with medical history significant for current alcohol abuse, tobacco abuse, chronic pancreatitis, hypertension, recent left hip surgery, who presents to [facility] ED from home due to severe epigastric pain associated with nausea and vomiting x 1 today.  Last alcoholic beverage was yesterday around 4 PM.  Drinks 2-3 beers daily.  Denies hematemesis or melena.  Admits to chills but no subjective fevers.  He presented to the ED for further evaluation.  In the ED, Bps are elevated, tachypnea, severe epigastric pain radiating to his back requiring multiple rounds of IV opioid-based analgesics.  Lipase level greater than 500.    CT abdomen pelvis with contrast revealed the following findings:  Masslike enlargement of the pancreatic head is similar to prior CT and MRI. This may reflect changes of acute pancreatitis, but underlying mass lesion difficult to exclude. Recommend follow-up CT or MRI after acute symptoms resolve.  Calcifications in the pancreas compatible with chronic pancreatitis. Intrahepatic and extrahepatic biliary ductal dilatation are stable since prior study and likely related to post cholecystectomy state Aortic atherosclerosis.   EDP requested admission for acute on chronic pancreatitis.  Admitted by [facility], hospitalist service, to telemetry unit as inpatient status.  ED Course: Temperature 97.7.  BP of 219/98, pulse 71, respiratory 20, saturation 100% on room air.  Lab studies notable for WBC 11.3, hemoglobin 12.2, platelet count 335, serum bicarb 21, glucose 164, alkaline phosphatase 294, lipase 556, AST 63, ALT 96.  Troponin 14, repeat 43.  Review of  Systems: Review of systems as noted in the HPI. All other systems reviewed and are negative.   Past Medical History:  Diagnosis Date   Abnormal CT scan, chest 06/2011   multiple lung nodules; needs repeat study in 3 to 12 months.   Arthritis    Hands   Chronic anticoagulation    Common bile duct dilation    COPD (chronic obstructive pulmonary disease) (HCC)    Pt denies   DVT (deep venous thrombosis) (HCC)    Gallstone pancreatitis    GERD (gastroesophageal reflux disease)    Hepatitis C    History of hiatal hernia    HTN (hypertension)    Noncompliance    Pancreatitis    Pancreatitis 06/2020   Pulmonary embolus (HCC)    Tick bite 07/2011   Tobacco abuse    Past Surgical History:  Procedure Laterality Date   ANKLE SURGERY Right 1998   3 surgeries.   CHOLECYSTECTOMY N/A 05/24/2020   Procedure: LAPAROSCOPIC CHOLECYSTECTOMY WITH INTRAOPERATIVE CHOLANGIOGRAM;  Surgeon: Gaynelle Adu, MD;  Location: [facility];  Service: General;  Laterality: N/A;   ESOPHAGOGASTRODUODENOSCOPY (EGD) WITH PROPOFOL N/A 04/01/2020   Procedure: ESOPHAGOGASTRODUODENOSCOPY (EGD) WITH PROPOFOL;  Surgeon: Rachael Fee, MD;  Location: [facility];  Service: Endoscopy;  Laterality: N/A;   EUS N/A 04/01/2020   Procedure: UPPER ENDOSCOPIC ULTRASOUND (EUS) RADIAL;  Surgeon: Rachael Fee, MD;  Location: [facility];  Service: Endoscopy;  Laterality: N/A;   FINGER AMPUTATION Left 1970   first part of middle finger removed   FOOT SURGERY Right    x 3 from a fall   INGUINAL HERNIA REPAIR Right    years ago   INTRAMEDULLARY (IM) NAIL INTERTROCHANTERIC Left  09/07/2022   Procedure: INTRAMEDULLARY (IM) NAIL INTERTROCHANTERIC;  Surgeon: Durene Romans, MD;  Location: [facility];  Service: Orthopedics;  Laterality: Left;    Social History:  reports that he has been smoking cigarettes. He has a 10.8 pack-year smoking history. He has never used smokeless tobacco. He reports current alcohol use. He reports that he does  not use drugs.   No Known Allergies  Family History  Problem Relation Age of Onset   Kidney disease Mother        ESRD dialysis   Diabetes Mother    Heart disease Mother        CHF   Heart failure Mother    Heart disease Father    Heart disease Sister 34       CABG   Coronary artery disease Sister    Coronary artery disease Sister    Heart disease Sister    Cerebral aneurysm Sister    Atrial fibrillation Brother    Hypertension Brother    Esophageal cancer Neg Hx    Colon cancer Neg Hx    Rectal cancer Neg Hx    Stomach cancer Neg Hx       Prior to Admission medications   Medication Sig Start Date End Date Taking? Authorizing Provider  methocarbamol (ROBAXIN) 500 MG tablet Take 1 tablet (500 mg total) by mouth every 6 (six) hours as needed for muscle spasms. 09/08/22  Yes Cassandria Anger, PA-C  metoprolol tartrate (LOPRESSOR) 25 MG tablet Take 0.5 tablets (12.5 mg total) by mouth 2 (two) times daily. 09/08/22  Yes Rhetta Mura, MD  oxyCODONE (OXY IR/ROXICODONE) 5 MG immediate release tablet Take 1 tablet (5 mg total) by mouth every 4 (four) hours as needed for severe pain. 09/08/22  Yes Rosalene Billings R, PA-C  polyethylene glycol (MIRALAX / GLYCOLAX) 17 g packet Take 17 g by mouth 2 (two) times daily. 09/08/22  Yes Cassandria Anger, PA-C  rivaroxaban (XARELTO) 10 MG TABS tablet Take 1 tablet (10 mg total) by mouth daily with breakfast for 14 days. After completing, take aspirin 81 mg twice daily for 1 month 09/08/22 09/22/22 Yes Cassandria Anger, PA-C  senna-docusate (SENOKOT-S) 8.6-50 MG tablet Take 2 tablets by mouth at bedtime as needed for up to 14 days for mild constipation. 09/08/22 09/22/22 Yes Cassandria Anger, PA-C    Physical Exam: BP (!) 209/98   Pulse 71   Temp 97.7 F (36.5 C) (Oral)   Resp 20   SpO2 100%   General: 67 y.o. year-old male well developed well nourished in no acute distress.  Alert and oriented x3. Cardiovascular: Regular rate and rhythm  with no rubs or gallops.  No thyromegaly or JVD noted.  No lower extremity edema. 2/4 pulses in all 4 extremities. Respiratory: Clear to auscultation with no wheezes or rales. Good inspiratory effort. Abdomen: Soft epigastric tenderness with palpation nondistended with normal bowel sounds x4 quadrants. Muskuloskeletal: No cyanosis, clubbing or edema noted bilaterally Neuro: CN II-XII intact, strength, sensation, reflexes Skin: No ulcerative lesions noted or rashes Psychiatry: Judgement and insight appear normal. Mood is appropriate for condition and setting          Labs on Admission:  Basic Metabolic Panel: Recent Labs  Lab 09/08/22 0402 09/14/22 2235  NA 130* 135  K 4.4 3.8  CL 99 100  CO2 26 21*  GLUCOSE 142* 164*  BUN 12 10  CREATININE 0.76 0.80  CALCIUM 8.0* 9.3   Liver Function Tests: Recent Labs  Lab 09/14/22 2235  AST 63*  ALT 96*  ALKPHOS 294*  BILITOT 0.7  PROT 7.4  ALBUMIN 3.9   Recent Labs  Lab 09/14/22 2235  LIPASE 556*   No results for input(s): "AMMONIA" in the last 168 hours. CBC: Recent Labs  Lab 09/08/22 0402 09/14/22 2235  WBC 6.0 11.3*  NEUTROABS  --  9.4*  HGB 10.9* 12.2*  HCT 33.1* 36.2*  MCV 94.8 93.1  PLT 145* 335   Cardiac Enzymes: No results for input(s): "CKTOTAL", "CKMB", "CKMBINDEX", "TROPONINI" in the last 168 hours.  BNP (last 3 results) No results for input(s): "BNP" in the last 8760 hours.  ProBNP (last 3 results) No results for input(s): "PROBNP" in the last 8760 hours.  CBG: No results for input(s): "GLUCAP" in the last 168 hours.  Radiological Exams on Admission: CT ABDOMEN PELVIS W CONTRAST  Result Date: 09/15/2022 CLINICAL DATA:  Right upper quadrant pain, epigastric pain, pancreatitis EXAM: CT ABDOMEN AND PELVIS WITH CONTRAST TECHNIQUE: Multidetector CT imaging of the abdomen and pelvis was performed using the standard protocol following bolus administration of intravenous contrast. RADIATION DOSE REDUCTION:  This exam was performed according to the departmental dose-optimization program which includes automated exposure control, adjustment of the mA and/or kV according to patient size and/or use of iterative reconstruction technique. CONTRAST:  OMNIPAQUE IOHEXOL 300 MG/ML  SOLN COMPARISON:  11/18/2020 FINDINGS: Lower chest: No acute abnormality. Hepatobiliary: Prior cholecystectomy. Mild intrahepatic and extrahepatic biliary ductal dilatation. Common bile duct measures up to 13 mm. Findings are stable since prior study. Pancreas: Enlarged masslike appearance of the pancreatic head, similar to prior studies. There is pancreatic ductal dilatation. Fluid/stranding around the pancreatic head and uncinate process. Calcifications scattered throughout the pancreatic body, head and uncinate process. Spleen: No focal abnormality.  Normal size. Adrenals/Urinary Tract: No adrenal abnormality. No focal renal abnormality. No stones or hydronephrosis. Urinary bladder is unremarkable. Stomach/Bowel: Normal appendix. Stomach, large and small bowel grossly unremarkable. Vascular/Lymphatic: Heavily calcified aorta and iliac vessels. No evidence of aneurysm or adenopathy. Reproductive: No visible focal abnormality. Other: No free fluid or free air. Musculoskeletal: No acute bony abnormality. IMPRESSION: Masslike enlargement of the pancreatic head is similar to prior CT and MRI. This may reflect changes of acute pancreatitis, but underlying mass lesion difficult to exclude. Recommend follow-up CT or MRI after acute symptoms resolve. Calcifications in the pancreas compatible with chronic pancreatitis. Intrahepatic and extrahepatic biliary ductal dilatation are stable since prior study and likely related to post cholecystectomy state Aortic atherosclerosis. Electronically Signed   By: Charlett Nose M.D.   On: 09/15/2022 02:36    EKG: I independently viewed the EKG done and my findings are as followed: Sinus rhythm rate of 62.   Nonspecific ST-T changes.  QTc 460.  Assessment/Plan Present on Admission:  Pancreatitis  Principal Problem:   Pancreatitis  Acute on chronic pancreatitis.   Presented with epigastric pain radiated to his back, elevated lipase greater than 500, findings suggestive of acute on chronic pancreatitis on CT scan.  Masslike lesion on the head of pancreatic patient made aware that he will need to have a repeated CT scan after his acute pancreatitis has resolved, he was agreeable. IV fluid hydration Supportive care Pain control IV antiemetics.  Elevated troponin: Suspect demand ischemia in the setting of acute on chronic pancreatitis High-sensitivity troponin uptrending 43 from 14 Denies any anginal symptoms No evidence of acute ischemia on twelve-lead EKG. Trend troponin Monitor on telemetry  Alcohol and tobacco abuse Current alcohol  and tobacco user Counseled on the importance of cessation Last alcohol intake was yesterday around 4 PM CIWA protocol Multivitamins, thiamine and folic acid supplement.  Hypertension, BP is not at goal, elevated Resume home oral antihypertensive IV hydralazine as needed with parameters. Continue to monitor vital signs  Masslike involving head of Pancreas seen on CT abdomen and pelvis with contrast Recommended repeat CT scan once acute pancreatitis has resolved. The patient was made aware of this finding.  History of DVT Resume home Xarelto  Elevated liver chemistries secondary to alcohol abuse Monitor LFTs Avoid hepatotoxic agents.  Recent left hip repair PT OT assessment Fall precautions Home Xarelto for DVT prophylaxis    Time: 75 minutes.    DVT prophylaxis: Home Xarelto  Code Status: Full code  Family Communication: None at bedside.  Disposition Plan: Admitted to telemetry medical unit  Consults called: None.  Admission status: Inpatient status.   Status is: Inpatient The patient requires at least 2 midnights for  further evaluation and treatment of present condition.   Darlin Drop MD [facility] Pager [phone]  If 7PM-7AM, please contact night-coverage [email or web address] Password [facility]  09/15/2022, 3:23 AM

## 2022-09-15 NOTE — Progress Notes (Signed)
PROGRESS NOTE  Justin Suarez  DOB: [birth year]  PCP: [name], MD MWU:[id number]  DOA: 09/14/2022  LOS: 0 days  Hospital Day: 2  Brief narrative: Justin Suarez is a 68 y.o. 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 Charlann Boxer. 7/25, patient presented to the ED with complaint of severe epigastric pain, nausea, vomiting.   Drinks about 2-3 beers a day.  Last alcohol use was the day prior.  In the ED, patient was afebrile, initial blood pressure was elevated to 222/97. Initial labs showed WC count of 11.3, hemoglobin 12.2, renal function normal, lipase elevated to 556, AST/ALT/alk phos elevated to 63/96/294. CT abdomen pelvis showed a masslike enlargement of the pancreatic head similar to prior CT and MRI from 2022-findings likely representing acute pancreatitis but unable to exclude a mass.  Calcifications in the pancreas compatible with chronic pancreatitis.  Stable intrahepatic extremity biliary duct dilatation likely due to postcholecystectomy status  Patient required multiple rounds of IV opiates Started on IV fluids, IV analgesics, IV antiemetics Admitted to [facility]  Subjective: Patient was seen and examined this morning.  Pleasant elderly Caucasian male.  Sitting up in recliner.  Not in distress.   Chart reviewed. Overnight afebrile, blood pressure remained elevated throughout the night mostly over 200, last reading 179/88 this morning Labs from discharge with lipase level down to 351, AST and ALT remain elevated, troponin mildly elevated to 43, sodium down to 132  Hospital course: Acute on chronic alcoholic pancreatitis Presented with severe epigastric pain, nausea, vomiting  Lipase elevated over 500.   CT abdomen with findings of acute pancreatitis.   MRI abdomen ordered to rule out mass  Currently on  conservative management with IV fluid, IV analgesics, IV antiemetics Remains n.p.o..  Wants to start diet.  Start clear liquid diet today. Lipase level better this morning.  Continue to monitor Recent Labs  Lab 09/14/22 2235 09/15/22 0609  LIPASE 556* 351*   Elevated liver enzymes Diffuse hepatic steatosis Previous hepatitis C -reportedly fully treated LFTs elevated as below.  Postcholecystectomy status with stable intrahepatic and extubated biliary dilatation MRI from 2022 showed diffuse hepatic steatosis.  Repeat MRI pending today. Recent Labs  Lab 09/14/22 2235 09/15/22 0609  AST 63* 117*  ALT 96* 90*  ALKPHOS 294* 248*  BILITOT 0.7 1.0  PROT 7.4 5.8*  ALBUMIN 3.9 2.9*  LIPASE 556* 351*  PLT 335 230   Chronic alcoholism Impending withdrawal Drinks about 2-3 beers a day probably putting it politely.  Last use was the day prior to admission Watch out for withdrawal symptoms CIWA protocol to continue  Hypertensive urgency Initial blood pressure was significant elevated over 200 and remained elevated for several hours 170s this morning It seems patient was started on metoprolol 25 twice daily last admission.  Questionable compliance. Started amlodipine this morning.   Elevated troponin Aortic atherosclerosis Troponin mildly elevated in the setting of significant CAD risk factors including HTN, smoking  Denies any anginal symptoms. Likely demand ischemia in the setting of pain and hypertensive urgency Obtain echocardiogram to rule out WMA. Continue to monitor on telemetry Primary prophylaxis aspirin, statin would help, if no evidence of liver cirrhosis on MRI. Recent Labs    09/14/22 2235 09/15/22 0025  TROPONINIHS 14 43*   Current smoker Counseled to quit. Nicotine patch offered  H/o DVT/PE Continue Xarelto   Osteoarthritis Recent  left hip fracture s/p IM nailing 7/18 -Dr. Charlann Boxer. Would avoid NSAIDs given high risk of GI bleeding in the setting of alcohol  use Continue oxycodone as needed, bowel regimen   Mobility: Encourage ambulation  Goals of care   Code Status: Full Code     DVT prophylaxis:  rivaroxaban (XARELTO) tablet 10 mg Start: 09/15/22 0800 rivaroxaban (XARELTO) tablet 10 mg   Antimicrobials: None Fluid: Currently on LR Consultants: None Family Communication: None at bedside  Status: Inpatient Level of care:  Telemetry   Patient from: Home Anticipated d/c to: Hopefully home in 1 to 2 days Needs to continue in-hospital care:  Ongoing conservative management with pancreatitis       Diet:  Diet Order             Diet clear liquid Room service appropriate? Yes; Fluid consistency: Thin  Diet effective now                   Scheduled Meds:  [START ON 09/16/2022] amLODipine  10 mg Oral Daily   folic acid  1 mg Oral Daily   metoprolol tartrate  12.5 mg Oral BID   multivitamin with minerals  1 tablet Oral Daily   rivaroxaban  10 mg Oral Q breakfast   thiamine  100 mg Oral Daily   Or   thiamine  100 mg Intravenous Daily    PRN meds: acetaminophen, hydrALAZINE, HYDROmorphone (DILAUDID) injection, LORazepam **OR** LORazepam, melatonin, oxyCODONE, polyethylene glycol, prochlorperazine   Infusions:   lactated ringers 100 mL/hr at 09/15/22 1100    Antimicrobials: Anti-infectives (From admission, onward)    None       Nutritional status:  There is no height or weight on file to calculate BMI.          Objective: Vitals:   09/15/22 0607 09/15/22 1046  BP: (!) 179/88 (!) 159/69  Pulse: 73 66  Resp: 20 18  Temp: (!) 97.5 F (36.4 C) 97.8 F (36.6 C)  SpO2: 100% 100%    Intake/Output Summary (Last 24 hours) at 09/15/2022 1157 Last data filed at 09/15/2022 1100 Gross per 24 hour  Intake 1731.67 ml  Output 350 ml  Net 1381.67 ml   There were no vitals filed for this visit. Weight change:  There is no height or weight on file to calculate BMI.   Physical Exam: General exam:  Pleasant, elderly Caucasian male. Skin: No rashes, lesions or ulcers. HEENT: Atraumatic, normocephalic, no obvious bleeding Lungs: Clear to auscultation bilaterally CVS: Regular rate and rhythm, no murmur GI/Abd soft, tenderness present in epigastrium, bowel sound present CNS: Alert, awake, oriented x 3 Psychiatry: Mood appropriate Extremities: No edema, no calf tenderness  Data Review: I have personally reviewed the laboratory data and studies available.  F/u labs ordered Unresulted Labs (From admission, onward)     Start     Ordered   09/16/22 0500  CBC with Differential/Platelet  Daily,   R     Question:  Specimen collection method  Answer:  Lab=Lab collect   09/15/22 1157   09/16/22 0500  Comprehensive metabolic panel  Daily,   R     Question:  Specimen collection method  Answer:  Lab=Lab collect   09/15/22 1157   09/16/22 0500  Lipase, blood  Daily,   R     Question:  Specimen collection method  Answer:  Lab=Lab collect   09/15/22 1157   09/16/22 0500  Phosphorus  Tomorrow morning,   R  Question:  Specimen collection method  Answer:  Lab=Lab collect   09/15/22 1157            Total time spent in review of labs and imaging, patient evaluation, formulation of plan, documentation and communication with family: 65 minutes  Signed, Lorin Glass, MD [facility] 09/15/2022

## 2022-09-15 NOTE — ED Notes (Signed)
Provider at bedside

## 2022-09-15 NOTE — TOC Initial Note (Signed)
Transition of Care [facility]) - Initial/Assessment Note    Patient Details  Name: Justin Suarez MRN: [id number] Date of Birth: 1955/04/24  [facility] of [facility]) CM/SW Contact:    Larrie Kass, LCSW Phone Number: 09/15/2022, 1:30 PM  Clinical Narrative:                 CSW spoke with pt regarding consultation for substance use resources, pt has declined. Pt reports no DME or transportation concerns. Pt was rec for [facility], pt is active with [facility]. No further TOC needs TOC sign-off.     Expected Discharge Plan: Home w Home Health Services Barriers to Discharge: Continued Medical Work up   Patient Goals and CMS Choice Patient states their goals for this hospitalization and ongoing recovery are:: retrun home          Expected Discharge Plan and Services In-house Referral: Clinical Social Work     Living arrangements for the past 2 months: Apartment                                      Prior Living Arrangements/Services Living arrangements for the past 2 months: Apartment Lives with:: Self Patient language and need for interpreter reviewed:: Yes Do you feel safe going back to the place where you live?: Yes      Need for Family Participation in Patient Care: No (Comment) Care giver support system in place?: No (comment) Current home services: Home PT Criminal Activity/Legal Involvement Pertinent to Current Situation/Hospitalization: No - Comment as needed  Activities of Daily Living Home Assistive Devices/Equipment: Eyeglasses, Crutches ADL Screening (condition at time of admission) Patient's cognitive ability adequate to safely complete daily activities?: No Is the patient deaf or have difficulty hearing?: No Does the patient have difficulty seeing, even when wearing glasses/contacts?: No Does the patient have difficulty concentrating, remembering, or making decisions?: No Patient able to express need for assistance with ADLs?: Yes Does the patient have  difficulty dressing or bathing?: Yes Independently performs ADLs?: Yes (appropriate for developmental age) Does the patient have difficulty walking or climbing stairs?: Yes Weakness of Legs: Both Weakness of Arms/Hands: None  Permission Sought/Granted                  Emotional Assessment Appearance:: Appears stated age Attitude/Demeanor/Rapport: Gracious Affect (typically observed): Accepting Orientation: : Oriented to Self, Oriented to Place, Oriented to  Time, Oriented to Situation   Psych Involvement: No (comment)  Admission diagnosis:  Pancreatitis [K85.90] Hypertensive emergency [I16.1] Alcohol-induced acute pancreatitis, unspecified complication status [K85.20] Patient Active Problem List   Diagnosis Date Noted   Pancreatitis 09/15/2022   Intertrochanteric fracture of left femur, closed, initial encounter (HCC) 09/06/2022   Hip fracture (HCC) 09/06/2022   Esophageal stricture 02/05/2022   Gastroesophageal reflux disease with esophagitis without hemorrhage 02/05/2022   Other chronic pancreatitis (HCC) 01/31/2022   Pancreatic cyst 01/31/2022   Hepatitis C test positive 10/02/2021   Encounter for screening for lung cancer 10/02/2021   Chronic viral hepatitis C (HCC) 10/02/2021   Tobacco use disorder 10/02/2021   Mixed hyperlipidemia 09/25/2021   Hyponatremia 05/22/2020   Essential hypertension, benign 05/22/2020   COPD (chronic obstructive pulmonary disease) (HCC) 04/13/2011   Tobacco abuse 03/15/2011   Long term (current) use of anticoagulants 03/15/2011   DVT, lower extremity (HCC) 03/15/2011   PCP:  [name], MD Pharmacy:   [facility] [phone] - [place], [place] -  [address] [place] [address] Phone: [phone] Fax: [phone]  [place] [facility] [address] [place] [address] Phone: [phone] Fax: [phone]     Social Determinants of Health (SDOH) Social History: SDOH  Screenings   Food Insecurity: No Food Insecurity (09/15/2022)  Housing: Low Risk  (09/15/2022)  Transportation Needs: No Transportation Needs (09/15/2022)  Utilities: Not At Risk (09/15/2022)  Depression (PHQ2-9): Low Risk  (01/31/2022)  Tobacco Use: High Risk (09/14/2022)   SDOH Interventions:     Readmission Risk Interventions     No data to display

## 2022-09-15 NOTE — ED Notes (Signed)
ED TO INPATIENT HANDOFF REPORT  ED Nurse Name and Phone #: [phone]  S Name/Age/Gender Justin Suarez 67 y.o. male Room/Bed: WA24/WA24  Code Status   Code Status: Full Code  Home/SNF/Other Home Patient oriented to: self, place, time, and situation Is this baseline? Yes   Triage Complete: Triage complete  Chief Complaint Pancreatitis [K85.90]  Triage Note Pt had hip surgery on L hip thursday. Vomiting x 2 and nausea. Pain started 1930 started into epigastric area and pancreas and now RUQ under ribs radiating into back.    Allergies No Known Allergies  Level of Care/Admitting Diagnosis ED Disposition     ED Disposition  Admit   Condition  --   Comment  Hospital Area: [facility] [[id number]]  Level of Care: Telemetry [5]  Admit to tele based on following criteria: Monitor for Ischemic changes  May admit patient to [facility] or [facility] if equivalent level of care is available:: Yes  Covid Evaluation: Asymptomatic - no recent exposure (last 10 days) testing not required  Diagnosis: Pancreatitis [202663]  Admitting Physician: Darlin Drop [[id number]]  Attending Physician: Darlin Drop [[id number]]  Certification:: I certify this patient will need inpatient services for at least 2 midnights  Estimated Length of Stay: 2          B Medical/Surgery History Past Medical History:  Diagnosis Date   Abnormal CT scan, chest 06/2011   multiple lung nodules; needs repeat study in 3 to 12 months.   Arthritis    Hands   Chronic anticoagulation    Common bile duct dilation    COPD (chronic obstructive pulmonary disease) (HCC)    Pt denies   DVT (deep venous thrombosis) (HCC)    Gallstone pancreatitis    GERD (gastroesophageal reflux disease)    Hepatitis C    History of hiatal hernia    HTN (hypertension)    Noncompliance    Pancreatitis    Pancreatitis 06/2020   Pulmonary embolus (HCC)    Tick bite 07/2011   Tobacco abuse    Past  Surgical History:  Procedure Laterality Date   ANKLE SURGERY Right 1998   3 surgeries.   CHOLECYSTECTOMY N/A 05/24/2020   Procedure: LAPAROSCOPIC CHOLECYSTECTOMY WITH INTRAOPERATIVE CHOLANGIOGRAM;  Surgeon: Gaynelle Adu, MD;  Location: [facility];  Service: General;  Laterality: N/A;   ESOPHAGOGASTRODUODENOSCOPY (EGD) WITH PROPOFOL N/A 04/01/2020   Procedure: ESOPHAGOGASTRODUODENOSCOPY (EGD) WITH PROPOFOL;  Surgeon: Rachael Fee, MD;  Location: [facility];  Service: Endoscopy;  Laterality: N/A;   EUS N/A 04/01/2020   Procedure: UPPER ENDOSCOPIC ULTRASOUND (EUS) RADIAL;  Surgeon: Rachael Fee, MD;  Location: [facility];  Service: Endoscopy;  Laterality: N/A;   FINGER AMPUTATION Left 1970   first part of middle finger removed   FOOT SURGERY Right    x 3 from a fall   INGUINAL HERNIA REPAIR Right    years ago   INTRAMEDULLARY (IM) NAIL INTERTROCHANTERIC Left 09/07/2022   Procedure: INTRAMEDULLARY (IM) NAIL INTERTROCHANTERIC;  Surgeon: Durene Romans, MD;  Location: [facility];  Service: Orthopedics;  Laterality: Left;     A IV Location/Drains/Wounds Patient Lines/Drains/Airways Status     Active Line/Drains/Airways     Name Placement date Placement time Site Days   Peripheral IV 09/14/22 20 G Anterior;Distal;Right Forearm 09/14/22  2240  Forearm  1   Incision - 4 Ports Abdomen Right;Lateral Right;Medial Mid;Medial Mid;Upper 05/24/20  1325  -- 844  Intake/Output Last 24 hours  Intake/Output Summary (Last 24 hours) at 09/15/2022 0504 Last data filed at 09/15/2022 0303 Gross per 24 hour  Intake 1000 ml  Output --  Net 1000 ml    Labs/Imaging Results for orders placed or performed during the hospital encounter of 09/14/22 (from the past 48 hour(s))  CBC with Differential     Status: Abnormal   Collection Time: 09/14/22 10:35 PM  Result Value Ref Range   WBC 11.3 (H) 4.0 - 10.5 K/uL   RBC 3.89 (L) 4.22 - 5.81 MIL/uL   Hemoglobin 12.2 (L) 13.0 - 17.0 g/dL   HCT  13.2 (L) 44.0 - 52.0 %   MCV 93.1 80.0 - 100.0 fL   MCH 31.4 26.0 - 34.0 pg   MCHC 33.7 30.0 - 36.0 g/dL   RDW 10.2 72.5 - 36.6 %   Platelets 335 150 - 400 K/uL   nRBC 0.0 0.0 - 0.2 %   Neutrophils Relative % 83 %   Neutro Abs 9.4 (H) 1.7 - 7.7 K/uL   Lymphocytes Relative 8 %   Lymphs Abs 0.9 0.7 - 4.0 K/uL   Monocytes Relative 9 %   Monocytes Absolute 1.0 0.1 - 1.0 K/uL   Eosinophils Relative 0 %   Eosinophils Absolute 0.0 0.0 - 0.5 K/uL   Basophils Relative 0 %   Basophils Absolute 0.0 0.0 - 0.1 K/uL   Immature Granulocytes 0 %   Abs Immature Granulocytes 0.02 0.00 - 0.07 K/uL    Comment: Performed at [facility], [address] [place], [place] [address]  Comprehensive metabolic panel     Status: Abnormal   Collection Time: 09/14/22 10:35 PM  Result Value Ref Range   Sodium 135 135 - 145 mmol/L   Potassium 3.8 3.5 - 5.1 mmol/L   Chloride 100 98 - 111 mmol/L   CO2 21 (L) 22 - 32 mmol/L   Glucose, Bld 164 (H) 70 - 99 mg/dL    Comment: Glucose reference range applies only to samples taken after fasting for at least 8 hours.   BUN 10 8 - 23 mg/dL   Creatinine, Ser 7.42 0.61 - 1.24 mg/dL   Calcium 9.3 8.9 - 59.5 mg/dL   Total Protein 7.4 6.5 - 8.1 g/dL   Albumin 3.9 3.5 - 5.0 g/dL   AST 63 (H) 15 - 41 U/L   ALT 96 (H) 0 - 44 U/L   Alkaline Phosphatase 294 (H) 38 - 126 U/L   Total Bilirubin 0.7 0.3 - 1.2 mg/dL   GFR, Estimated >63 >87 mL/min    Comment: (NOTE) Calculated using the CKD-EPI Creatinine Equation (2021)    Anion gap 14 5 - 15    Comment: Performed at [facility], [address], [place], [place] [address]  Lipase, blood     Status: Abnormal   Collection Time: 09/14/22 10:35 PM  Result Value Ref Range   Lipase 556 (H) 11 - 51 U/L    Comment: RESULTS CONFIRMED BY MANUAL DILUTION Performed at [facility], [address] [place], [place] [address]   Troponin I (High Sensitivity)     Status: None    Collection Time: 09/14/22 10:35 PM  Result Value Ref Range   Troponin I (High Sensitivity) 14 <18 ng/L    Comment: (NOTE) Elevated high sensitivity troponin I (hsTnI) values and significant  changes across serial measurements may suggest ACS but many other  chronic and acute conditions are known to elevate  hsTnI results.  Refer to the "Links" section for chest pain algorithms and additional  guidance. Performed at [facility], [address] [place], [place] [address]   Urinalysis, w/ Reflex to Culture (Infection Suspected) -Urine, Clean Catch     Status: Abnormal   Collection Time: 09/14/22 11:00 PM  Result Value Ref Range   Specimen Source URINE, CLEAN CATCH    Color, Urine YELLOW YELLOW   APPearance CLEAR CLEAR   Specific Gravity, Urine 1.016 1.005 - 1.030   pH 7.0 5.0 - 8.0   Glucose, UA NEGATIVE NEGATIVE mg/dL   Hgb urine dipstick NEGATIVE NEGATIVE   Bilirubin Urine NEGATIVE NEGATIVE   Ketones, ur 20 (A) NEGATIVE mg/dL   Protein, ur NEGATIVE NEGATIVE mg/dL   Nitrite NEGATIVE NEGATIVE   Leukocytes,Ua NEGATIVE NEGATIVE   RBC / HPF 0-5 0 - 5 RBC/hpf   WBC, UA 0-5 0 - 5 WBC/hpf    Comment:        Reflex urine culture not performed if WBC <=10, OR if Squamous epithelial cells >5. If Squamous epithelial cells >5 suggest recollection.    Bacteria, UA NONE SEEN NONE SEEN   Squamous Epithelial / HPF 0-5 0 - 5 /HPF    Comment: Performed at [facility], [address], [place], [place] [address]  Troponin I (High Sensitivity)     Status: Abnormal   Collection Time: 09/15/22 12:25 AM  Result Value Ref Range   Troponin I (High Sensitivity) 43 (H) <18 ng/L    Comment: (NOTE) Elevated high sensitivity troponin I (hsTnI) values and significant  changes across serial measurements may suggest ACS but many other  chronic and acute conditions are known to elevate hsTnI results.  Refer to the "Links" section for chest pain algorithms and  additional  guidance. Performed at [facility], [address] [place], [place] [address]    CT ABDOMEN PELVIS W CONTRAST  Result Date: 09/15/2022 CLINICAL DATA:  Right upper quadrant pain, epigastric pain, pancreatitis EXAM: CT ABDOMEN AND PELVIS WITH CONTRAST TECHNIQUE: Multidetector CT imaging of the abdomen and pelvis was performed using the standard protocol following bolus administration of intravenous contrast. RADIATION DOSE REDUCTION: This exam was performed according to the departmental dose-optimization program which includes automated exposure control, adjustment of the mA and/or kV according to patient size and/or use of iterative reconstruction technique. CONTRAST:  OMNIPAQUE IOHEXOL 300 MG/ML  SOLN COMPARISON:  11/18/2020 FINDINGS: Lower chest: No acute abnormality. Hepatobiliary: Prior cholecystectomy. Mild intrahepatic and extrahepatic biliary ductal dilatation. Common bile duct measures up to 13 mm. Findings are stable since prior study. Pancreas: Enlarged masslike appearance of the pancreatic head, similar to prior studies. There is pancreatic ductal dilatation. Fluid/stranding around the pancreatic head and uncinate process. Calcifications scattered throughout the pancreatic body, head and uncinate process. Spleen: No focal abnormality.  Normal size. Adrenals/Urinary Tract: No adrenal abnormality. No focal renal abnormality. No stones or hydronephrosis. Urinary bladder is unremarkable. Stomach/Bowel: Normal appendix. Stomach, large and small bowel grossly unremarkable. Vascular/Lymphatic: Heavily calcified aorta and iliac vessels. No evidence of aneurysm or adenopathy. Reproductive: No visible focal abnormality. Other: No free fluid or free air. Musculoskeletal: No acute bony abnormality. IMPRESSION: Masslike enlargement of the pancreatic head is similar to prior CT and MRI. This may reflect changes of acute pancreatitis, but underlying mass lesion difficult to  exclude. Recommend follow-up CT or MRI after acute symptoms resolve. Calcifications in the pancreas compatible with chronic pancreatitis. Intrahepatic and extrahepatic biliary ductal  dilatation are stable since prior study and likely related to post cholecystectomy state Aortic atherosclerosis. Electronically Signed   By: Charlett Nose M.D.   On: 09/15/2022 02:36    Pending Labs Unresulted Labs (From admission, onward)     Start     Ordered   09/15/22 0502  Lipase, blood  Once,   R        09/15/22 0501   09/15/22 0500  CBC  Tomorrow morning,   R        09/15/22 0358   09/15/22 0500  Comprehensive metabolic panel  Tomorrow morning,   R        09/15/22 0358   09/15/22 0500  Magnesium  Tomorrow morning,   R        09/15/22 0358   09/15/22 0500  Phosphorus  Tomorrow morning,   R        09/15/22 0358            Vitals/Pain Today's Vitals   09/15/22 0303 09/15/22 0400 09/15/22 0445 09/15/22 0500  BP: (!) 209/98 (!) 186/88  (!) 184/85  Pulse: 71 80  67  Resp: 20 (!) 23  19  Temp:      TempSrc:      SpO2: 100% 100%  100%  PainSc: 9   5      Isolation Precautions No active isolations  Medications Medications  acetaminophen (TYLENOL) tablet 650 mg (has no administration in time range)  hydrALAZINE (APRESOLINE) injection 5 mg (has no administration in time range)  lactated ringers infusion ( Intravenous New Bag/Given 09/15/22 0341)  prochlorperazine (COMPAZINE) injection 5 mg (has no administration in time range)  polyethylene glycol (MIRALAX / GLYCOLAX) packet 17 g (has no administration in time range)  melatonin tablet 5 mg (has no administration in time range)  LORazepam (ATIVAN) tablet 1-4 mg (has no administration in time range)    Or  LORazepam (ATIVAN) injection 1-4 mg (has no administration in time range)  thiamine (VITAMIN B1) tablet 100 mg (has no administration in time range)    Or  thiamine (VITAMIN B1) injection 100 mg (has no administration in time range)  folic  acid (FOLVITE) tablet 1 mg (has no administration in time range)  multivitamin with minerals tablet 1 tablet (has no administration in time range)  rivaroxaban (XARELTO) tablet 10 mg (has no administration in time range)  metoprolol tartrate (LOPRESSOR) tablet 12.5 mg (has no administration in time range)  oxyCODONE (Oxy IR/ROXICODONE) immediate release tablet 5-10 mg (has no administration in time range)  HYDROmorphone (DILAUDID) injection 0.5 mg (has no administration in time range)  HYDROmorphone (DILAUDID) injection 0.5 mg (0.5 mg Intravenous Given 09/14/22 2240)  ondansetron (ZOFRAN) injection 4 mg (4 mg Intravenous Given 09/14/22 2240)  HYDROmorphone (DILAUDID) injection 1 mg (1 mg Intravenous Given 09/14/22 2334)  HYDROmorphone (DILAUDID) injection 0.5 mg (0.5 mg Intravenous Given 09/15/22 0108)  sodium chloride 0.9 % bolus 1,000 mL (0 mLs Intravenous Stopped 09/15/22 0303)  labetalol (NORMODYNE) injection 5 mg (5 mg Intravenous Given 09/15/22 0136)  iohexol (OMNIPAQUE) 300 MG/ML solution 100 mL (100 mLs Intravenous Contrast Given 09/15/22 0157)  nitroGLYCERIN (NITROGLYN) 2 % ointment 0.5 inch (0.5 inches Topical Given 09/15/22 0230)  amLODipine (NORVASC) tablet 10 mg (10 mg Oral Given 09/15/22 0231)  hydrALAZINE (APRESOLINE) injection 5 mg (5 mg Intravenous Given 09/15/22 0341)    Mobility walks     Focused Assessments See chart   R Recommendations: See Admitting Provider Note  Report given to:  Additional Notes:  Pt is alert and oriented and ambulates with assist after recent left hip fx repair.

## 2022-09-15 NOTE — ED Notes (Signed)
Pt returned from CT °

## 2022-09-15 NOTE — ED Notes (Signed)
Pt requesting more pin meds. Provider aware

## 2022-09-15 NOTE — Evaluation (Signed)
Physical Therapy Evaluation Patient Details Name: Justin Suarez MRN: [id number] DOB: [birth year] Today's Date: 09/15/2022  History of Present Illness  Pt is a 67 year old male admitted 09/14/22 with pancreatitis. Of note, s/p Closed reduction intramedullary nailing of left intertrochanteric femur fracture on 09/07/22. PMH: alcohol abuse, tobacco abuse, chronic pancreatitis, HTN  Clinical Impression  Pt admitted with above diagnosis. Pt recently in hospital for hip fracture surgery, reports returned home with girlfriend who is completing all household chores and pt reports he is able to mobilize around the home with crutches independently. Pt able to verbalize PWB appropriately and appears to adhere with transfers and ambulation. Recommend return home with girlfriend support and resume HHPT. Pt currently with functional limitations due to the deficits listed below (see PT Problem List). Pt will benefit from acute skilled PT to increase their independence and safety with mobility to allow discharge.           Assistance Recommended at Discharge PRN  If plan is discharge home, recommend the following:  Can travel by private vehicle  Help with stairs or ramp for entrance;Assist for transportation;Assistance with cooking/housework        Equipment Recommendations None recommended by PT  Recommendations for Other Services       Functional Status Assessment Patient has had a recent decline in their functional status and demonstrates the ability to make significant improvements in function in a reasonable and predictable amount of time.     Precautions / Restrictions Precautions Precautions: Fall Restrictions Weight Bearing Restrictions: Yes LLE Weight Bearing: Partial weight bearing LLE Partial Weight Bearing Percentage or Pounds: 50      Mobility  Bed Mobility Overal bed mobility: Modified Independent             General bed mobility comments: increased time, no physical  assist or cues    Transfers Overall transfer level: Needs assistance Equipment used: Crutches Transfers: Sit to/from Stand Sit to Stand: Supervision           General transfer comment: supv for safety, powers to stand with axillary crutches appropriately    Ambulation/Gait Ambulation/Gait assistance: Supervision, Min guard Gait Distance (Feet): 150 Feet Assistive device: Crutches Gait Pattern/deviations: Step-to pattern Gait velocity: decreased     General Gait Details: step to gait pattern, good UE support on crutches, no LOB or unsteadiness noted, pt verbalizes PWB status appropriately and appears to adhere  Stairs            Wheelchair Mobility     Tilt Bed    Modified Rankin (Stroke Patients Only)       Balance Overall balance assessment: History of Falls, Mild deficits observed, not formally tested Sitting-balance support: Feet supported Sitting balance-Leahy Scale: Good     Standing balance support: During functional activity, Reliant on assistive device for balance Standing balance-Leahy Scale: Poor                               Pertinent Vitals/Pain Pain Assessment Pain Assessment: Faces Faces Pain Scale: Hurts little more Pain Location: abdomen Pain Descriptors / Indicators: Discomfort Pain Intervention(s): Limited activity within patient's tolerance, Monitored during session, Premedicated before session, Repositioned    Home Living Family/patient expects to be discharged to:: Private residence Living Arrangements: Spouse/significant other (girlfriend) Available Help at Discharge: Friend(s);Available 24 hours/day Type of Home: House Home Access: Stairs to enter   [employer] of Steps: 1   Home Layout:  One level Home Equipment: Crutches Additional Comments: Pt reports has SPC, RW    Prior Function Prior Level of Function : Independent/Modified Independent             Mobility Comments: pt reports ind with  crutches ADLs Comments: pt reports ind with self care, girlfriend completing household chores     Hand Dominance        Extremity/Trunk Assessment   Upper Extremity Assessment Upper Extremity Assessment: Overall WFL for tasks assessed    Lower Extremity Assessment Lower Extremity Assessment: Overall WFL for tasks assessed LLE Deficits / Details: AROM WFL, strength grossly 3/5    Cervical / Trunk Assessment Cervical / Trunk Assessment: Normal  Communication   Communication: No difficulties  Cognition Arousal/Alertness: Awake/alert Behavior During Therapy: WFL for tasks assessed/performed Overall Cognitive Status: Within Functional Limits for tasks assessed                                          General Comments      Exercises     Assessment/Plan    PT Assessment Patient needs continued PT services  PT Problem List Decreased strength;Decreased activity tolerance;Decreased balance;Decreased knowledge of use of DME;Decreased knowledge of precautions;Pain       PT Treatment Interventions DME instruction;Gait training;Stair training;Functional mobility training;Therapeutic activities;Therapeutic exercise;Balance training;Patient/family education    PT Goals (Current goals can be found in the Care Plan section)  Acute Rehab PT Goals Patient Stated Goal: return home PT Goal Formulation: With patient Time For Goal Achievement: 09/29/22 Potential to Achieve Goals: Good    Frequency Min 1X/week     Co-evaluation               AM-PAC PT "6 Clicks" Mobility  Outcome Measure Help needed turning from your back to your side while in a flat bed without using bedrails?: None Help needed moving from lying on your back to sitting on the side of a flat bed without using bedrails?: None Help needed moving to and from a bed to a chair (including a wheelchair)?: A Little Help needed standing up from a chair using your arms (e.g., wheelchair or bedside  chair)?: A Little Help needed to walk in hospital room?: A Little Help needed climbing 3-5 steps with a railing? : A Little 6 Click Score: 20    End of Session Equipment Utilized During Treatment: Gait belt Activity Tolerance: Patient tolerated treatment well Patient left: in chair;with call bell/phone within reach;with chair alarm set Nurse Communication: Mobility status PT Visit Diagnosis: Difficulty in walking, not elsewhere classified (R26.2);Pain    Time: 2130-8657 PT Time Calculation (min) (ACUTE ONLY): 13 min   Charges:   PT Evaluation $PT Eval Low Complexity: 1 Low   PT General Charges $$ ACUTE PT VISIT: 1 Visit        Tori Latif Nazareno PT, DPT 09/15/22, 12:04 PM

## 2022-09-15 NOTE — Evaluation (Signed)
Occupational Therapy Evaluation Patient Details Name: Justin Suarez MRN: [id number] DOB: 1955/07/23 Today's Date: 09/15/2022   History of Present Illness Pt is a 67 year old male admitted 09/14/22 with pancreatitis. Of note, s/p Closed reduction intramedullary nailing of left intertrochanteric femur fracture on 09/07/22. PMH: alcohol abuse, tobacco abuse, chronic pancreatitis, HTN   Clinical Impression   PTA pt modified independent with ADL and mobility using crutches since recently fracturing his L femur. Pt at baseline level of function and plans to DC to stay with his girlfriend who can assist as needed after DC. Educated on recommendation of shower chair - pt verbalized understanding. Educated on strategies to reduce risk of falls.  No further OT needed. OT signing off.      Recommendations for follow up therapy are one component of a multi-disciplinary discharge planning process, led by the attending physician.  Recommendations may be updated based on patient status, additional functional criteria and insurance authorization.   Assistance Recommended at Discharge Intermittent Supervision/Assistance  Patient can return home with the following Assist for transportation    Functional Status Assessment  Patient has not had a recent decline in their functional status  Equipment Recommendations  Tub/shower seat (pt to get on his own)    Recommendations for Other Services       Precautions / Restrictions Precautions Precautions: Fall Restrictions LLE Weight Bearing: Partial weight bearing LLE Partial Weight Bearing Percentage or Pounds: 50      Mobility Bed Mobility Overal bed mobility: Modified Independent                  Transfers Overall transfer level: Modified independent                 General transfer comment: has been walking back and forth to the bathroom      Balance     Sitting balance-Leahy Scale: Good       Standing balance-Leahy Scale:  Fair                             ADL either performed or assessed with clinical judgement   ADL Overall ADL's : At baseline                                       General ADL Comments: pt mod I with ADL; discussed home safety and recommendations for use of a showerchair to reduce risk of falls; pt currently sits on edge of tub, swings legs around then stands to shower. Educated on recommendation to use showerchair as he is parital WB on LLE. PT verbalized understanding. Plans ot get shower chair once discharged     Vision Baseline Vision/History: 1 Wears glasses       Perception     Praxis      Pertinent Vitals/Pain Pain Assessment Pain Assessment: 0-10 Pain Score: 3  Pain Location: abdomen Pain Descriptors / Indicators: Discomfort Pain Intervention(s): Limited activity within patient's tolerance     Hand Dominance     Extremity/Trunk Assessment Upper Extremity Assessment Upper Extremity Assessment: Overall WFL for tasks assessed   Lower Extremity Assessment Lower Extremity Assessment: Defer to PT evaluation LLE Deficits / Details: AROM WFL, strength grossly 3/5   Cervical / Trunk Assessment Cervical / Trunk Assessment: Normal   Communication     Cognition Arousal/Alertness: Awake/alert Behavior During Therapy: [name]  for tasks assessed/performed Overall Cognitive Status: Within Functional Limits for tasks assessed                                       General Comments       Exercises     Shoulder Instructions      Home Living Family/patient expects to be discharged to:: Private residence Living Arrangements: Spouse/significant other Available Help at Discharge: Friend(s);Available 24 hours/day Type of Home: House Home Access: Stairs to enter [employer] of Steps: 1   Home Layout: One level     Bathroom Shower/Tub: IT trainer: Standard     Home Equipment:  Crutches   Additional Comments: Pt reports has SPC, RW      Prior Functioning/Environment Prior Level of Function : Independent/Modified Independent             Mobility Comments: mod I @ crutch level with ADL and mobility          OT Problem List:        OT Treatment/Interventions:      OT Goals(Current goals can be found in the care plan section) Acute Rehab OT Goals Patient Stated Goal: home OT Goal Formulation: All assessment and education complete, DC therapy  OT Frequency:      Co-evaluation              AM-PAC OT "6 Clicks" Daily Activity     Outcome Measure Help from another person eating meals?: None Help from another person taking care of personal grooming?: None Help from another person toileting, which includes using toliet, bedpan, or urinal?: None Help from another person bathing (including washing, rinsing, drying)?: None Help from another person to put on and taking off regular upper body clothing?: None Help from another person to put on and taking off regular lower body clothing?: None 6 Click Score: 24   End of Session Nurse Communication: Other (comment) (DC needs)  Activity Tolerance: Patient tolerated treatment well Patient left: in bed;with call bell/phone within reach  OT Visit Diagnosis: Pain Pain - part of body:  (abdomen)                Time: 7846-9629 OT Time Calculation (min): 15 min Charges:  OT General Charges $OT Visit: 1 Visit OT Evaluation $OT Eval Low Complexity: 1 Low  Justin Suarez, OT/L   Acute OT Clinical Specialist Acute Rehabilitation Services Pager [phone] Office [phone]   [facility] 09/15/2022, 3:07 PM

## 2022-09-15 NOTE — ED Notes (Signed)
Pt's RA sats 85% with good pleth. 2 L [place] placed. RN updated.

## 2022-09-16 ENCOUNTER — Inpatient Hospital Stay (HOSPITAL_COMMUNITY): Payer: 59

## 2022-09-16 DIAGNOSIS — K852 Alcohol induced acute pancreatitis without necrosis or infection: Secondary | ICD-10-CM | POA: Diagnosis not present

## 2022-09-16 MED ORDER — GADOBUTROL 1 MMOL/ML IV SOLN
7.0000 mL | Freq: Once | INTRAVENOUS | Status: AC | PRN
  Administered 2022-09-16: 7 mL via INTRAVENOUS

## 2022-09-16 NOTE — Progress Notes (Signed)
   09/16/22 1803  Assess: MEWS Score  Temp (!) 102.8 F (39.3 C)  BP (!) 155/74  MAP (mmHg) 97  Pulse Rate 79  ECG Heart Rate 79  Resp 19  Level of Consciousness Alert  SpO2 96 %  O2 Device Room Air  Patient Activity (if Appropriate) In bed  Assess: MEWS Score  MEWS Temp 2  MEWS Systolic 0  MEWS Pulse 0  MEWS RR 0  MEWS LOC 0  MEWS Score 2  MEWS Score Color Yellow  Assess: if the MEWS score is Yellow or Red  Were vital signs accurate and taken at a resting state? Yes  Does the patient meet 2 or more of the SIRS criteria? No  MEWS guidelines implemented  Yes, yellow  Treat  MEWS Interventions Considered administering scheduled or prn medications/treatments as ordered  Take Vital Signs  Increase Vital Sign Frequency  Yellow: Q2hr x1, continue Q4hrs until patient remains green for 12hrs  Escalate  MEWS: Escalate Yellow: Discuss with charge nurse and consider notifying provider and/or RRT  Notify: Charge Nurse/RN  Name of Charge Nurse/RN Notified Cephas Darby., RN  Provider Notification  Provider Name/Title Lorin Glass, MD  Date Provider Notified 09/16/22  Time Provider Notified 1812  Method of Notification Page  Notification Reason Other (Comment) (elevated temperature)  Provider response See new orders  Date of Provider Response 09/16/22  Time of Provider Response 1822  Assess: SIRS CRITERIA  SIRS Temperature  1  SIRS Pulse 0  SIRS Respirations  0  SIRS WBC 0  SIRS Score Sum  1

## 2022-09-16 NOTE — Progress Notes (Signed)
PROGRESS NOTE  Justin Suarez  DOB: December 14, 1955  PCP: [name], MD ZOX:[id number]  DOA: 09/14/2022  LOS: 1 day  Hospital Day: 3  Brief narrative: Justin Suarez is a 67 y.o. 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 Charlann Boxer. 7/25, patient presented to the ED with complaint of severe epigastric pain, nausea, vomiting.   Drinks about 2-3 beers a day.  Last alcohol use was the day prior.  In the ED, patient was afebrile, initial blood pressure was elevated to 222/97. Initial labs showed WC count of 11.3, hemoglobin 12.2, renal function normal, lipase elevated to 556, AST/ALT/alk phos elevated to 63/96/294. CT abdomen pelvis showed a masslike enlargement of the pancreatic head similar to prior CT and MRI from 2022-findings likely representing acute pancreatitis but unable to exclude a mass.  Calcifications in the pancreas compatible with chronic pancreatitis.  Stable intrahepatic extremity biliary duct dilatation likely due to postcholecystectomy status  Patient required multiple rounds of IV opiates Started on IV fluids, IV analgesics, IV antiemetics Admitted to [facility]  Subjective: Patient was seen and examined this morning.  Pleasant elderly Caucasian male.   Propped up in bed.  Not in distress.  Abdominal pain improving.   Lipase level improving.  LFTs worsening though  Hospital course: Acute on chronic alcoholic pancreatitis Presented with severe epigastric pain, nausea, vomiting  Lipase elevated over 500.   CT abdomen with findings of acute pancreatitis.   MRI abdomen ordered to rule out mass  Currently on conservative management with IV fluid, IV analgesics, IV antiemetics Abdomen pain improving.  Lipase level improving. Tolerating clear liquid diet.  Advance to full liquid diet today.  If tolerates, plan  to start soft diet tomorrow.  Continue to monitor Recent Labs  Lab 09/14/22 2235 09/15/22 0609 09/16/22 0459  LIPASE 556* 351* 96*   Elevated liver enzymes Diffuse hepatic steatosis Previous hepatitis C -reportedly fully treated LFTs elevated as below.  Postcholecystectomy status with stable intrahepatic and extubated biliary dilatation MRI from 2022 showed diffuse hepatic steatosis.   LFTs worsening. MRCP ordered today. Recent Labs  Lab 09/14/22 2235 09/15/22 0609 09/16/22 0459  AST 63* 117* 306*  ALT 96* 90* 218*  ALKPHOS 294* 248* 346*  BILITOT 0.7 1.0 3.1*  PROT 7.4 5.8* 5.9*  ALBUMIN 3.9 2.9* 2.9*  LIPASE 556* 351* 96*  PLT 335 230 198   Chronic alcoholism Impending withdrawal Drinks about 2-3 beers a day probably putting it politely.  Last use was the day prior to admission Watch out for withdrawal symptoms CIWA protocol to continue  Hypertensive urgency Initial blood pressure was significant elevated over 200 and remained elevated for several hours 170s this morning Gradually improving on metoprolol and amlodipine.  Continue to monitor.   Elevated troponin Aortic atherosclerosis Troponin was mildly elevated in the setting of significant CAD risk factors including HTN, smoking  Denies any anginal symptoms. Likely demand ischemia in the setting of pain and hypertensive urgency Echocardiogram with normal EF, mild LVH and no wall motion abnormality Continue to monitor on telemetry Primary prophylaxis aspirin, statin would help, if no evidence of liver cirrhosis on MRI. Recent Labs    09/14/22 2235 09/15/22 0025  TROPONINIHS 14 43*   Current smoker Counseled to quit. Nicotine patch offered  H/o DVT/PE Continue Xarelto   Osteoarthritis Recent left hip fracture s/p IM nailing 7/18 -Dr. Charlann Boxer.  Would avoid NSAIDs given high risk of GI bleeding in the setting of alcohol use Continue oxycodone as needed, bowel regimen   Mobility: Encourage  ambulation  Goals of care   Code Status: Full Code     DVT prophylaxis:  rivaroxaban (XARELTO) tablet 10 mg Start: 09/15/22 0800 rivaroxaban (XARELTO) tablet 10 mg   Antimicrobials: None Fluid: Currently on LR Consultants: None Family Communication: None at bedside  Status: Inpatient Level of care:  Telemetry   Patient from: Home Anticipated d/c to: Hopefully home in 1 to 2 days Needs to continue in-hospital care:  Ongoing conservative management with pancreatitis       Diet:  Diet Order             Diet full liquid Fluid consistency: Thin  Diet effective now                   Scheduled Meds:  amLODipine  10 mg Oral Daily   folic acid  1 mg Oral Daily   metoprolol tartrate  12.5 mg Oral BID   multivitamin with minerals  1 tablet Oral Daily   rivaroxaban  10 mg Oral Q breakfast   thiamine  100 mg Oral Daily   Or   thiamine  100 mg Intravenous Daily    PRN meds: acetaminophen, hydrALAZINE, HYDROmorphone (DILAUDID) injection, LORazepam **OR** LORazepam, melatonin, oxyCODONE, polyethylene glycol, prochlorperazine   Infusions:   lactated ringers 100 mL/hr at 09/16/22 2952    Antimicrobials: Anti-infectives (From admission, onward)    None       Nutritional status:  Body mass index is 22.02 kg/m.          Objective: Vitals:   09/16/22 0520 09/16/22 0940  BP: (!) 157/77 (!) 150/82  Pulse: 69 71  Resp: 18   Temp: 98.9 F (37.2 C) 98.1 F (36.7 C)  SpO2: 98% 99%    Intake/Output Summary (Last 24 hours) at 09/16/2022 1046 Last data filed at 09/16/2022 0941 Gross per 24 hour  Intake 2009.24 ml  Output 3020 ml  Net -1010.76 ml   Filed Weights   09/16/22 0043  Weight: 71.6 kg   Weight change:  Body mass index is 22.02 kg/m.   Physical Exam: General exam: Pleasant, elderly Caucasian male. Skin: No rashes, lesions or ulcers. HEENT: Atraumatic, normocephalic, no obvious bleeding Lungs: Clear to auscultation bilaterally CVS:  Regular rate and rhythm, no murmur GI/Abd soft, tenderness improving in epigastrium, bowel sound present CNS: Alert, awake, oriented x 3 Psychiatry: Mood appropriate Extremities: No edema, no calf tenderness  Data Review: I have personally reviewed the laboratory data and studies available.  F/u labs ordered Unresulted Labs (From admission, onward)     Start     Ordered   09/16/22 0500  CBC with Differential/Platelet  Daily,   R     Question:  Specimen collection method  Answer:  Lab=Lab collect   09/15/22 1157   09/16/22 0500  Comprehensive metabolic panel  Daily,   R     Question:  Specimen collection method  Answer:  Lab=Lab collect   09/15/22 1157   09/16/22 0500  Lipase, blood  Daily,   R     Question:  Specimen collection method  Answer:  Lab=Lab collect   09/15/22 1157            Total time spent in review of labs and imaging, patient evaluation, formulation of plan, documentation and communication with family: 55 minutes  Signed, Lorin Glass, MD [facility] 09/16/2022

## 2022-09-16 NOTE — Plan of Care (Signed)

## 2022-09-16 NOTE — Progress Notes (Signed)
Mobility Specialist - Progress Note   09/16/22 1338  Mobility  Activity Ambulated with assistance in hallway  Level of Assistance Modified independent, requires aide device or extra time  Assistive Device Crutches  Distance Ambulated (ft) 350 ft  Range of Motion/Exercises Active  Activity Response Tolerated well  Mobility Referral Yes  $Mobility charge 1 Mobility  Mobility Specialist Start Time (ACUTE ONLY) 1325  Mobility Specialist Stop Time (ACUTE ONLY) 1339  Mobility Specialist Time Calculation (min) (ACUTE ONLY) 14 min   Pt received in bed and agreed to mobility, had no issues throughout session. Returned to bed with all needs met.  Marilynne Halsted Mobility Specialist

## 2022-09-16 NOTE — Consult Note (Signed)
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  He states that he was well until his girlfriend gave him a hotdog to eat.  His last ETOH consumption was yesterday and he drinks 2-3 beers per day.  He was last evaluated by Dr. Marina Goodell in the office on 11/30/2021.  The patient was noted to have a prior EUS with Dr. Christella Hartigan on 04/01/2020 with findings of a mildly dilated CBD, thick walled gallbladder at 2-3 mm, pancreatic calcifications, and a 1.5 cm septated cystic lesion in the uncinate process.  The plan was to monitor this issue.  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  The CBD is now measured to be 13 mm.  Past Medical History:  Diagnosis Date   Abnormal CT scan, chest 06/2011   multiple lung nodules; needs repeat study in 3 to 12 months.   Arthritis    Hands   Chronic anticoagulation    Common bile duct dilation    COPD (chronic obstructive pulmonary disease) (HCC)    Pt denies   DVT (deep venous thrombosis) (HCC)    Gallstone pancreatitis    GERD (gastroesophageal reflux disease)    Hepatitis C    History of hiatal hernia    HTN (hypertension)    Noncompliance    Pancreatitis    Pancreatitis 06/2020   Pulmonary embolus (HCC)    Tick bite 07/2011   Tobacco abuse     Past Surgical History:  Procedure Laterality Date   ANKLE SURGERY Right 1998   3 surgeries.   CHOLECYSTECTOMY N/A 05/24/2020   Procedure: LAPAROSCOPIC CHOLECYSTECTOMY WITH INTRAOPERATIVE CHOLANGIOGRAM;  Surgeon:  Gaynelle Adu, MD;  Location: [facility];  Service: General;  Laterality: N/A;   ESOPHAGOGASTRODUODENOSCOPY (EGD) WITH PROPOFOL N/A 04/01/2020   Procedure: ESOPHAGOGASTRODUODENOSCOPY (EGD) WITH PROPOFOL;  Surgeon: Rachael Fee, MD;  Location: [facility];  Service: Endoscopy;  Laterality: N/A;   EUS N/A 04/01/2020   Procedure: UPPER ENDOSCOPIC ULTRASOUND (EUS) RADIAL;  Surgeon: Rachael Fee, MD;  Location: [facility];  Service: Endoscopy;  Laterality: N/A;   FINGER AMPUTATION Left 1970   first part of middle finger removed   FOOT SURGERY Right    x 3 from a fall   INGUINAL HERNIA REPAIR Right    years ago   INTRAMEDULLARY (IM) NAIL INTERTROCHANTERIC Left 09/07/2022   Procedure: INTRAMEDULLARY (IM) NAIL INTERTROCHANTERIC;  Surgeon: Durene Romans, MD;  Location: [facility];  Service: Orthopedics;  Laterality: Left;    Family History  Problem Relation Age of Onset   Kidney disease Mother        ESRD dialysis   Diabetes Mother    Heart disease Mother        CHF   Heart failure Mother    Heart disease Father    Heart disease Sister 44       CABG   Coronary artery disease Sister    Coronary artery disease Sister    Heart disease Sister    Cerebral aneurysm Sister  Atrial fibrillation Brother    Hypertension Brother    Esophageal cancer Neg Hx    Colon cancer Neg Hx    Rectal cancer Neg Hx    Stomach cancer Neg Hx     Social History:  reports that he has been smoking cigarettes. He has a 10.8 pack-year smoking history. He has never used smokeless tobacco. He reports current alcohol use. He reports that he does not use drugs.  Allergies: No Known Allergies  Medications: Scheduled:  amLODipine  10 mg Oral Daily   folic acid  1 mg Oral Daily   metoprolol tartrate  12.5 mg Oral BID   multivitamin with minerals  1 tablet Oral Daily   rivaroxaban  10 mg Oral Q breakfast   thiamine  100 mg Oral Daily   Or   thiamine  100 mg Intravenous Daily   Continuous:  lactated ringers  100 mL/hr at 09/16/22 1610    Results for orders placed or performed during the hospital encounter of 09/14/22 (from the past 24 hour(s))  CBC with Differential/Platelet     Status: Abnormal   Collection Time: 09/16/22  4:59 AM  Result Value Ref Range   WBC 5.1 4.0 - 10.5 K/uL   RBC 3.14 (L) 4.22 - 5.81 MIL/uL   Hemoglobin 9.8 (L) 13.0 - 17.0 g/dL   HCT 96.0 (L) 45.4 - 09.8 %   MCV 95.9 80.0 - 100.0 fL   MCH 31.2 26.0 - 34.0 pg   MCHC 32.6 30.0 - 36.0 g/dL   RDW 11.9 14.7 - 82.9 %   Platelets 198 150 - 400 K/uL   nRBC 0.0 0.0 - 0.2 %   Neutrophils Relative % 61 %   Neutro Abs 3.1 1.7 - 7.7 K/uL   Lymphocytes Relative 23 %   Lymphs Abs 1.2 0.7 - 4.0 K/uL   Monocytes Relative 15 %   Monocytes Absolute 0.7 0.1 - 1.0 K/uL   Eosinophils Relative 1 %   Eosinophils Absolute 0.0 0.0 - 0.5 K/uL   Basophils Relative 0 %   Basophils Absolute 0.0 0.0 - 0.1 K/uL   Immature Granulocytes 0 %   Abs Immature Granulocytes 0.00 0.00 - 0.07 K/uL  Comprehensive metabolic panel     Status: Abnormal   Collection Time: 09/16/22  4:59 AM  Result Value Ref Range   Sodium 132 (L) 135 - 145 mmol/L   Potassium 3.8 3.5 - 5.1 mmol/L   Chloride 99 98 - 111 mmol/L   CO2 25 22 - 32 mmol/L   Glucose, Bld 95 70 - 99 mg/dL   BUN 7 (L) 8 - 23 mg/dL   Creatinine, Ser 5.62 0.61 - 1.24 mg/dL   Calcium 8.4 (L) 8.9 - 10.3 mg/dL   Total Protein 5.9 (L) 6.5 - 8.1 g/dL   Albumin 2.9 (L) 3.5 - 5.0 g/dL   AST 130 (H) 15 - 41 U/L   ALT 218 (H) 0 - 44 U/L   Alkaline Phosphatase 346 (H) 38 - 126 U/L   Total Bilirubin 3.1 (H) 0.3 - 1.2 mg/dL   GFR, Estimated >86 >57 mL/min   Anion gap 8 5 - 15  Lipase, blood     Status: Abnormal   Collection Time: 09/16/22  4:59 AM  Result Value Ref Range   Lipase 96 (H) 11 - 51 U/L  Phosphorus     Status: None   Collection Time: 09/16/22  4:59 AM  Result Value Ref Range   Phosphorus 3.8 2.5 - 4.6  mg/dL     MR ABDOMEN MRCP W WO CONTAST  Result Date: 09/16/2022 CLINICAL DATA:   Jaundice. History of pancreatitis and pancreatic cyst. EXAM: MRI ABDOMEN WITHOUT AND WITH CONTRAST (INCLUDING MRCP) TECHNIQUE: Multiplanar multisequence MR imaging of the abdomen was performed both before and after the administration of intravenous contrast. Heavily T2-weighted images of the biliary and pancreatic ducts were obtained, and three-dimensional MRCP images were rendered by post processing. CONTRAST:  7mL GADAVIST GADOBUTROL 1 MMOL/ML IV SOLN COMPARISON:  CT 09/14/2022 and older.  MRI 01/02/2022 and older FINDINGS: Lower chest: Mild linear opacity seen in the right lung base likely scar or atelectasis. No pleural effusion. Hepatobiliary: No enhancing space-occupying liver lesion. Patent portal vein. There is diffuse biliary ductal dilatation which is slightly increased from November 2023. The common duct has a diameter proximally approaching 13 mm but tapers abruptly at the pancreatic head. There is no mural enhancement or nodularity. This could be a benign stricture with a history of pancreatitis. Please correlate for any known history. Pancreas: Severe atrophy of the pancreas with ductal dilatation including side-branch dilatation diffusely. There is stricture, narrowing of the pancreatic head along the pancreatic duct where there is complex heterogeneous masslike area which has a mixed signal on precontrast T1 and T2 with some central bright T2 signal. This area measures a proximally 4.4 x 4.5 cm and could be the sequela of previous pancreatitis. Along the posterior aspect of the structure is a rounded area measuring 12 mm in AP diameter which shows to be an area of enhancement comparing pre and postcontrast. 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 The lesion in question overall does cause mass effect along surrounding structures causing narrowing of the portal venous  confluence. There is a occlusion of the SMV. Several left upper quadrant collateral vessels are identified. Spleen:  Within normal limits in size and appearance. Adrenals/Urinary Tract: No masses identified. No evidence of hydronephrosis. Stomach/Bowel: Visualized portions within the abdomen are unremarkable. Vascular/Lymphatic: Circumaortic left renal vein. Atherosclerotic changes along the aorta. Normal caliber aorta and IVC. No specific abnormal lymph node enlargement identified. Other: There is some stranding along the central mesentery as well as upper retroperitoneum. Trace right upper quadrant ascites. Musculoskeletal: Curvature of the spine with degenerative changes. IMPRESSION: Complex mass, fluid collection once again seen extending caudal to the head and neck of the pancreas. 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 Recommend intervention consultation. Although the lesion very well could be consistent with the history of pancreatitis, with the appearance of the mass lesion, recommend continued surveillance to confirm resolution and exclude secondary pathology. 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 There is also severe dilatation of the pancreatic duct with stricture of the pancreatic head. The lesion also associated with occlusion of the SMV, narrowing of the proximal portal vein with numerous collateral vessels. Trace ascites and mesenteric stranding. Electronically Signed   By: Karen Kays M.D.   On: 09/16/2022 14:18   MR 3D Recon At Scanner  Result Date: 09/16/2022 CLINICAL DATA:  Jaundice. History of pancreatitis and pancreatic cyst. EXAM: MRI ABDOMEN WITHOUT AND WITH CONTRAST (INCLUDING  MRCP) TECHNIQUE: Multiplanar multisequence MR  imaging of the abdomen was performed both before and after the administration of intravenous contrast. Heavily T2-weighted images of the biliary and pancreatic ducts were obtained, and three-dimensional MRCP images were rendered by post processing. CONTRAST:  7mL GADAVIST GADOBUTROL 1 MMOL/ML IV SOLN COMPARISON:  CT 09/14/2022 and older.  MRI 01/02/2022 and older FINDINGS: Lower chest: Mild linear opacity seen in the right lung base likely scar or atelectasis. No pleural effusion. Hepatobiliary: No enhancing space-occupying liver lesion. Patent portal vein. There is diffuse biliary ductal dilatation which is slightly increased from November 2023. The common duct has a diameter proximally approaching 13 mm but tapers abruptly at the pancreatic head. There is no mural enhancement or nodularity. This could be a benign stricture with a history of pancreatitis. Please correlate for any known history. Pancreas: Severe atrophy of the pancreas with ductal dilatation including side-branch dilatation diffusely. There is stricture, narrowing of the pancreatic head along the pancreatic duct where there is complex heterogeneous masslike area which has a mixed signal on precontrast T1 and T2 with some central bright T2 signal. This area measures a proximally 4.4 x 4.5 cm and could be the sequela of previous pancreatitis. Along the posterior aspect of the structure is a rounded area measuring 12 mm in AP diameter which shows to be an area of enhancement comparing pre and postcontrast. 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 The lesion in question overall does cause mass effect along surrounding structures causing narrowing of the portal venous confluence. There is a occlusion of the SMV. Several left upper quadrant collateral vessels are identified. Spleen:  Within normal limits in size and  appearance. Adrenals/Urinary Tract: No masses identified. No evidence of hydronephrosis. Stomach/Bowel: Visualized portions within the abdomen are unremarkable. Vascular/Lymphatic: Circumaortic left renal vein. Atherosclerotic changes along the aorta. Normal caliber aorta and IVC. No specific abnormal lymph node enlargement identified. Other: There is some stranding along the central mesentery as well as upper retroperitoneum. Trace right upper quadrant ascites. Musculoskeletal: Curvature of the spine with degenerative changes. IMPRESSION: Complex mass, fluid collection once again seen extending caudal to the head and neck of the pancreas. 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 Recommend intervention consultation. Although the lesion very well could be consistent with the history of pancreatitis, with the appearance of the mass lesion, recommend continued surveillance to confirm resolution and exclude secondary pathology. 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 There is also severe dilatation of the pancreatic duct with stricture of the pancreatic head. The lesion also associated with occlusion of the SMV, narrowing of the proximal portal vein with numerous collateral vessels. Trace ascites and mesenteric stranding. Electronically Signed   By: Karen Kays M.D.   On: 09/16/2022 14:18   ECHOCARDIOGRAM COMPLETE  Result Date: 09/15/2022    ECHOCARDIOGRAM REPORT   Patient Name:   Justin Suarez Date of Exam: 09/15/2022 Medical Rec #:  [id number]        Height:       71.0 in Accession #:    [id number]       Weight:  160.5 lb Date of Birth:  1955-05-10        BSA:          1.920 m Patient Age:    30 years         BP:           159/69 mmHg Patient  Gender: M                HR:           61 bpm. Exam Location:  Inpatient Procedure: 2D Echo, Cardiac Doppler and Color Doppler Indications:    Abnormal EKG  History:        Patient has prior history of Echocardiogram examinations, most                 recent 03/22/2011. COPD; Risk Factors:Hypertension, Dyslipidemia,                 Current Smoker and hx DVT, hx PE, alcohol abuse.  Sonographer:    Wallie Char Referring Phys: [id number] [facility]  Sonographer Comments: Image acquisition challenging due to COPD. IMPRESSIONS  1. Left ventricular ejection fraction, by estimation, is 55 to 60%. The left ventricle has normal function. The left ventricle has no regional wall motion abnormalities. There is mild left ventricular hypertrophy. Left ventricular diastolic parameters were normal.  2. Right ventricular systolic function is normal. The right ventricular size is normal.  3. Trivial mitral valve regurgitation.  4. The aortic valve is tricuspid. Aortic valve regurgitation is not visualized. FINDINGS  Left Ventricle: Left ventricular ejection fraction, by estimation, is 55 to 60%. The left ventricle has normal function. The left ventricle has no regional wall motion abnormalities. The left ventricular internal cavity size was normal in size. There is  mild left ventricular hypertrophy. Left ventricular diastolic parameters were normal. Right Ventricle: The right ventricular size is normal. Right vetricular wall thickness was not assessed. Right ventricular systolic function is normal. Left Atrium: Left atrial size was normal in size. Right Atrium: Right atrial size was normal in size. Pericardium: There is no evidence of pericardial effusion. Mitral Valve: Mild mitral annular calcification. Trivial mitral valve regurgitation. MV peak gradient, 4.2 mmHg. The mean mitral valve gradient is 2.0 mmHg. Tricuspid Valve: The tricuspid valve is normal in structure. Tricuspid valve regurgitation is trivial. Aortic Valve: The  aortic valve is tricuspid. Aortic valve regurgitation is not visualized. Aortic valve mean gradient measures 3.0 mmHg. Aortic valve peak gradient measures 6.0 mmHg. Aortic valve area, by VTI measures 3.40 cm. Pulmonic Valve: The pulmonic valve was not well visualized. Pulmonic valve regurgitation is not visualized. Aorta: The aortic root is normal in size and structure. IAS/Shunts: No atrial level shunt detected by color flow Doppler.  LEFT VENTRICLE PLAX 2D LVIDd:         4.90 cm     Diastology LVIDs:         3.60 cm     LV e' medial:    7.35 cm/s LV PW:         0.90 cm     LV E/e' medial:  11.0 LV IVS:        1.20 cm     LV e' lateral:   9.75 cm/s LVOT diam:     2.30 cm     LV E/e' lateral: 8.3 LV SV:         92 LV SV Index:   48 LVOT Area:     4.15 cm  LV Volumes (  MOD) LV vol d, MOD A2C: 72.6 ml LV vol d, MOD A4C: 97.6 ml LV vol s, MOD A2C: 31.4 ml LV vol s, MOD A4C: 40.9 ml LV SV MOD A2C:     41.2 ml LV SV MOD A4C:     97.6 ml LV SV MOD BP:      50.8 ml RIGHT VENTRICLE             IVC RV Basal diam:  3.40 cm     IVC diam: 2.70 cm RV S prime:     12.80 cm/s TAPSE (M-mode): 2.7 cm LEFT ATRIUM             Index        RIGHT ATRIUM           Index LA diam:        3.80 cm 1.98 cm/m   RA Area:     15.70 cm LA Vol (A2C):   45.0 ml 23.43 ml/m  RA Volume:   39.10 ml  20.36 ml/m LA Vol (A4C):   45.5 ml 23.69 ml/m LA Biplane Vol: 46.6 ml 24.27 ml/m  AORTIC VALVE AV Area (Vmax):    3.47 cm AV Area (Vmean):   3.29 cm AV Area (VTI):     3.40 cm AV Vmax:           122.00 cm/s AV Vmean:          85.400 cm/s AV VTI:            0.272 m AV Peak Grad:      6.0 mmHg AV Mean Grad:      3.0 mmHg LVOT Vmax:         102.00 cm/s LVOT Vmean:        67.600 cm/s LVOT VTI:          0.222 m LVOT/AV VTI ratio: 0.82  AORTA Ao Root diam: 3.80 cm Ao Asc diam:  3.60 cm MITRAL VALVE MV Area (PHT): 2.66 cm    SHUNTS MV Area VTI:   3.03 cm    Systemic VTI:  0.22 m MV Peak grad:  4.2 mmHg    Systemic Diam: 2.30 cm MV Mean grad:  2.0 mmHg  MV Vmax:       1.02 m/s MV Vmean:      57.1 cm/s MV Decel Time: 285 msec MV E velocity: 81.00 cm/s MV A velocity: 83.30 cm/s MV E/A ratio:  0.97 Dietrich Pates MD Electronically signed by Dietrich Pates MD Signature Date/Time: 09/15/2022/4:52:01 PM    Final    CT ABDOMEN PELVIS W CONTRAST  Result Date: 09/15/2022 CLINICAL DATA:  Right upper quadrant pain, epigastric pain, pancreatitis EXAM: CT ABDOMEN AND PELVIS WITH CONTRAST TECHNIQUE: Multidetector CT imaging of the abdomen and pelvis was performed using the standard protocol following bolus administration of intravenous contrast. RADIATION DOSE REDUCTION: This exam was performed according to the departmental dose-optimization program which includes automated exposure control, adjustment of the mA and/or kV according to patient size and/or use of iterative reconstruction technique. CONTRAST:  OMNIPAQUE IOHEXOL 300 MG/ML  SOLN COMPARISON:  11/18/2020 FINDINGS: Lower chest: No acute abnormality. Hepatobiliary: Prior cholecystectomy. Mild intrahepatic and extrahepatic biliary ductal dilatation. Common bile duct measures up to 13 mm. Findings are stable since prior study. Pancreas: Enlarged masslike appearance of the pancreatic head, similar to prior studies. There is pancreatic ductal dilatation. Fluid/stranding around the pancreatic head and uncinate process. Calcifications scattered throughout the pancreatic body, head and uncinate process.  Spleen: No focal abnormality.  Normal size. Adrenals/Urinary Tract: No adrenal abnormality. No focal renal abnormality. No stones or hydronephrosis. Urinary bladder is unremarkable. Stomach/Bowel: Normal appendix. Stomach, large and small bowel grossly unremarkable. Vascular/Lymphatic: Heavily calcified aorta and iliac vessels. No evidence of aneurysm or adenopathy. Reproductive: No visible focal abnormality. Other: No free fluid or free air. Musculoskeletal: No acute bony abnormality. IMPRESSION: Masslike enlargement of the  pancreatic head is similar to prior CT and MRI. This may reflect changes of acute pancreatitis, but underlying mass lesion difficult to exclude. Recommend follow-up CT or MRI after acute symptoms resolve. Calcifications in the pancreas compatible with chronic pancreatitis. Intrahepatic and extrahepatic biliary ductal dilatation are stable since prior study and likely related to post cholecystectomy state Aortic atherosclerosis. Electronically Signed   By: Charlett Nose M.D.   On: 09/15/2022 02:36    ROS:  As stated above in the HPI otherwise negative.  Blood pressure 116/71, pulse 62, temperature 99.2 F (37.3 C), temperature source Oral, resp. rate 18, height 5\' 11"  (1.803 m), weight 71.6 kg, SpO2 98%.    PE: Gen: NAD, Alert and Oriented HEENT:  [place]/AT, EOMI Neck: Supple, no LAD Lungs: CTA Bilaterally CV: RRR without M/G/R ABD: Soft, NTND, +BS Ext: No C/C/E  Assessment/Plan: 1) Chronic calcific pancreatitis. 2) Visceral abdominal aneurysm. 3) Ongoing ETOH abuse. 4) Elevated AP.   The patient's mass-like finding in the head of the pancreas is not new.  The patient's current presentation is similar to his other exacerbations of his pancreatitis when he eats a fatty meal, per Dr. Lamar Sprinkles office note.  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  The CBD dilation is increasing and his liver enzymes are also increasing as well as the TB.  He will need some type of intervention to help open up the CBD.  Plan: 1) ERCP with stent placement with [facility] during this admission. 2) Continue with supportive care. 3) Follow liver enzymes.  Justin Suarez D 09/16/2022, 5:41 PM

## 2022-09-17 ENCOUNTER — Encounter (HOSPITAL_COMMUNITY): Payer: Self-pay | Admitting: Anesthesiology

## 2022-09-17 ENCOUNTER — Encounter (HOSPITAL_COMMUNITY): Payer: Self-pay | Admitting: Internal Medicine

## 2022-09-17 DIAGNOSIS — K852 Alcohol induced acute pancreatitis without necrosis or infection: Secondary | ICD-10-CM | POA: Diagnosis not present

## 2022-09-17 LAB — COMPREHENSIVE METABOLIC PANEL WITH GFR
ALT: 155 U/L — ABNORMAL HIGH (ref 0–44)
AST: 137 U/L — ABNORMAL HIGH (ref 15–41)
Albumin: 2.9 g/dL — ABNORMAL LOW (ref 3.5–5.0)
Alkaline Phosphatase: 342 U/L — ABNORMAL HIGH (ref 38–126)
Anion gap: 8 (ref 5–15)
BUN: 8 mg/dL (ref 8–23)
CO2: 26 mmol/L (ref 22–32)
Calcium: 8.3 mg/dL — ABNORMAL LOW (ref 8.9–10.3)
Chloride: 95 mmol/L — ABNORMAL LOW (ref 98–111)
Creatinine, Ser: 0.71 mg/dL (ref 0.61–1.24)
GFR, Estimated: >60 mL/min (ref >60)
Glucose, Bld: 109 mg/dL — ABNORMAL HIGH (ref 70–99)
Potassium: 4 mmol/L (ref 3.5–5.1)
Sodium: 129 mmol/L — ABNORMAL LOW (ref 135–145)
Total Bilirubin: 2.1 mg/dL — ABNORMAL HIGH (ref 0.3–1.2)
Total Protein: 5.9 g/dL — ABNORMAL LOW (ref 6.5–8.1)

## 2022-09-17 LAB — CBC WITH DIFFERENTIAL/PLATELET
Abs Immature Granulocytes: 0.02 10*3/uL (ref 0.00–0.07)
Basophils Absolute: 0 10*3/uL (ref 0.0–0.1)
Basophils Relative: 0 %
Eosinophils Absolute: 0 10*3/uL (ref 0.0–0.5)
Eosinophils Relative: 1 %
HCT: 28.8 % — ABNORMAL LOW (ref 39.0–52.0)
Hemoglobin: 9.6 g/dL — ABNORMAL LOW (ref 13.0–17.0)
Immature Granulocytes: 0 %
Lymphocytes Relative: 21 %
Lymphs Abs: 1 10*3/uL (ref 0.7–4.0)
MCH: 31.7 pg (ref 26.0–34.0)
MCHC: 33.3 g/dL (ref 30.0–36.0)
MCV: 95 fL (ref 80.0–100.0)
Monocytes Absolute: 0.6 10*3/uL (ref 0.1–1.0)
Monocytes Relative: 12 %
Neutro Abs: 3.2 10*3/uL (ref 1.7–7.7)
Neutrophils Relative %: 66 %
Platelets: 191 10*3/uL (ref 150–400)
RBC: 3.03 MIL/uL — ABNORMAL LOW (ref 4.22–5.81)
RDW: 14 % (ref 11.5–15.5)
WBC: 5 10*3/uL (ref 4.0–10.5)
nRBC: 0 % (ref 0.0–0.2)

## 2022-09-17 LAB — LIPASE, BLOOD: Lipase: 49 U/L (ref 11–51)

## 2022-09-17 MED ORDER — PIPERACILLIN-TAZOBACTAM 3.375 G IVPB
3.3750 g | Freq: Three times a day (TID) | INTRAVENOUS | Status: DC
  Administered 2022-09-17 – 2022-09-20 (×10): 3.375 g via INTRAVENOUS
  Filled 2022-09-17 (×10): qty 50

## 2022-09-17 MED ORDER — SODIUM CHLORIDE 0.9 % IV SOLN: INTRAVENOUS | Status: DC

## 2022-09-17 NOTE — Progress Notes (Signed)
Subjective: Complaining about abdominal pain.  He states that his pain medication was discontinued.  Objective: Vital signs in last 24 hours: Temp:  [98 F (36.7 C)-100.7 F (38.2 C)] 98 F (36.7 C) (07/28 1509) Pulse Rate:  [61-78] 69 (07/28 1509) Resp:  [16-20] 20 (07/28 1509) BP: (110-152)/(63-90) 134/63 (07/28 1509) SpO2:  [96 %-100 %] 100 % (07/28 1509) Last BM Date : 09/13/22  Intake/Output from previous day: 07/27 0701 - 07/28 0700 In: 3105.2 [P.O.:720; I.V.:2385.2] Out: 3975 [Urine:3975] Intake/Output this shift: No intake/output data recorded.  General appearance: alert and no distress GI: some upper abdominal tenderness  Lab Results: Recent Labs    09/15/22 0609 09/16/22 0459 09/17/22 0416  WBC 7.4 5.1 5.0  HGB 10.2* 9.8* 9.6*  HCT 30.9* 30.1* 28.8*  PLT 230 198 191   BMET Recent Labs    09/15/22 0609 09/16/22 0459 09/17/22 0416  NA 132* 132* 129*  K 4.4 3.8 4.0  CL 98 99 95*  CO2 21* 25 26  GLUCOSE 124* 95 109*  BUN 9 7* 8  CREATININE 0.48* 0.63 0.71  CALCIUM 8.2* 8.4* 8.3*   LFT Recent Labs    09/17/22 0416  PROT 5.9*  ALBUMIN 2.9*  AST 137*  ALT 155*  ALKPHOS 342*  BILITOT 2.1*   PT/INR No results for input(s): "LABPROT", "INR" in the last 72 hours. Hepatitis Panel No results for input(s): "HEPBSAG", "HCVAB", "HEPAIGM", "HEPBIGM" in the last 72 hours. C-Diff No results for input(s): "CDIFFTOX" in the last 72 hours. Fecal Lactopherrin No results for input(s): "FECLLACTOFRN" in the last 72 hours.  Studies/Results: MR ABDOMEN MRCP W WO CONTAST  Result Date: 09/16/2022 CLINICAL DATA:  Jaundice. History of pancreatitis and pancreatic cyst. EXAM: MRI ABDOMEN WITHOUT AND WITH CONTRAST (INCLUDING MRCP) TECHNIQUE: Multiplanar multisequence MR imaging of the abdomen was performed both before and after the administration of intravenous contrast. Heavily T2-weighted images of the biliary and pancreatic ducts were obtained, and  three-dimensional MRCP images were rendered by post processing. CONTRAST:  7mL GADAVIST GADOBUTROL 1 MMOL/ML IV SOLN COMPARISON:  CT 09/14/2022 and older.  MRI 01/02/2022 and older FINDINGS: Lower chest: Mild linear opacity seen in the right lung base likely scar or atelectasis. No pleural effusion. Hepatobiliary: No enhancing space-occupying liver lesion. Patent portal vein. There is diffuse biliary ductal dilatation which is slightly increased from November 2023. The common duct has a diameter proximally approaching 13 mm but tapers abruptly at the pancreatic head. There is no mural enhancement or nodularity. This could be a benign stricture with a history of pancreatitis. Please correlate for any known history. Pancreas: Severe atrophy of the pancreas with ductal dilatation including side-branch dilatation diffusely. There is stricture, narrowing of the pancreatic head along the pancreatic duct where there is complex heterogeneous masslike area which has a mixed signal on precontrast T1 and T2 with some central bright T2 signal. This area measures a proximally 4.4 x 4.5 cm and could be the sequela of previous pancreatitis. Along the posterior aspect of the structure is a rounded area measuring 12 mm in AP diameter which shows to be an area of enhancement comparing pre and postcontrast. 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 The lesion in question overall does cause mass effect along surrounding structures causing narrowing of the portal venous confluence. There is a occlusion of the SMV. Several left upper  quadrant collateral vessels are identified. Spleen:  Within normal limits in size and appearance. Adrenals/Urinary Tract: No masses identified. No evidence of hydronephrosis. Stomach/Bowel: Visualized portions within the abdomen are unremarkable. Vascular/Lymphatic: Circumaortic  left renal vein. Atherosclerotic changes along the aorta. Normal caliber aorta and IVC. No specific abnormal lymph node enlargement identified. Other: There is some stranding along the central mesentery as well as upper retroperitoneum. Trace right upper quadrant ascites. Musculoskeletal: Curvature of the spine with degenerative changes. IMPRESSION: Complex mass, fluid collection once again seen extending caudal to the head and neck of the pancreas. 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 Recommend intervention consultation. Although the lesion very well could be consistent with the history of pancreatitis, with the appearance of the mass lesion, recommend continued surveillance to confirm resolution and exclude secondary pathology. 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 There is also severe dilatation of the pancreatic duct with stricture of the pancreatic head. The lesion also associated with occlusion of the SMV, narrowing of the proximal portal vein with numerous collateral vessels. Trace ascites and mesenteric stranding. Electronically Signed   By: Karen Kays M.D.   On: 09/16/2022 14:18   MR 3D Recon At Scanner  Result Date: 09/16/2022 CLINICAL DATA:  Jaundice. History of pancreatitis and pancreatic cyst. EXAM: MRI ABDOMEN WITHOUT AND WITH CONTRAST (INCLUDING MRCP) TECHNIQUE: Multiplanar multisequence MR imaging of the abdomen was performed both before and after the administration of intravenous contrast. Heavily T2-weighted images of the biliary and pancreatic ducts were obtained, and three-dimensional MRCP images were rendered by post processing. CONTRAST:  7mL GADAVIST GADOBUTROL 1 MMOL/ML IV SOLN COMPARISON:  CT 09/14/2022 and older.  MRI  01/02/2022 and older FINDINGS: Lower chest: Mild linear opacity seen in the right lung base likely scar or atelectasis. No pleural effusion. Hepatobiliary: No enhancing space-occupying liver lesion. Patent portal vein. There is diffuse biliary ductal dilatation which is slightly increased from November 2023. The common duct has a diameter proximally approaching 13 mm but tapers abruptly at the pancreatic head. There is no mural enhancement or nodularity. This could be a benign stricture with a history of pancreatitis. Please correlate for any known history. Pancreas: Severe atrophy of the pancreas with ductal dilatation including side-branch dilatation diffusely. There is stricture, narrowing of the pancreatic head along the pancreatic duct where there is complex heterogeneous masslike area which has a mixed signal on precontrast T1 and T2 with some central bright T2 signal. This area measures a proximally 4.4 x 4.5 cm and could be the sequela of previous pancreatitis. Along the posterior aspect of the structure is a rounded area measuring 12 mm in AP diameter which shows to be an area of enhancement comparing pre and postcontrast. 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 The lesion in question overall does cause mass effect along surrounding structures causing narrowing of the portal venous confluence. There is a occlusion of the SMV. Several left upper quadrant collateral vessels are identified. Spleen:  Within normal limits in size and appearance. Adrenals/Urinary Tract: No masses identified. No evidence of hydronephrosis. Stomach/Bowel: Visualized portions within the abdomen are unremarkable. Vascular/Lymphatic: Circumaortic  left renal vein. Atherosclerotic changes along the aorta. Normal caliber aorta and IVC. No specific abnormal lymph node enlargement identified. Other: There is  some stranding along the central mesentery as well as upper retroperitoneum. Trace right upper quadrant ascites. Musculoskeletal: Curvature of the spine with degenerative changes. IMPRESSION: Complex mass, fluid collection once again seen extending caudal to the head and neck of the pancreas. 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 Recommend intervention consultation. Although the lesion very well could be consistent with the history of pancreatitis, with the appearance of the mass lesion, recommend continued surveillance to confirm resolution and exclude secondary pathology. 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 There is also severe dilatation of the pancreatic duct with stricture of the pancreatic head. The lesion also associated with occlusion of the SMV, narrowing of the proximal portal vein with numerous collateral vessels. Trace ascites and mesenteric stranding. Electronically Signed   By: Karen Kays M.D.   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 2) Chronic pancreatitis pain. 3) Elevated liver enzymes.   The patient has narcotic pain medications ordered.  He will be scheduled for an ERCP with stent placement.  Plan: 1) ERCP tomorrow with Dr. Marina Goodell.  LOS: 2 days    Kemba Hoppes D 09/17/2022, 7:14 PM

## 2022-09-17 NOTE — Progress Notes (Signed)
Physical Therapy Treatment and Discharge from Acute PT  Patient Details Name: Justin Suarez MRN: [id number] DOB: 18-Jun-1955 Today's Date: 09/17/2022   History of Present Illness Pt is a 67 year old male admitted 09/14/22 with pancreatitis. Of note, s/p Closed reduction intramedullary nailing of left intertrochanteric femur fracture on 09/07/22. PMH: alcohol abuse, tobacco abuse, chronic pancreatitis, HTN    PT Comments  Pt able to ambulate around unit with crutches and appears to maintain PWB well.  Further discussed importance of PWB for healing and maintaining to discuss with ortho MD at follow up (pt talking about using a cane however encouraged staying with crutches to better adhere to PWB status).  Pt also able to verbalize a few LE exercises he has been performing in bed.  Pt to f/u with HHPT.   Pt had met acute PT goals so will discharge from acute PT at this time.  Recommend pt continue ambulating with nursing staff and mobility specialists.    Assistance Recommended at Discharge PRN  If plan is discharge home, recommend the following:  Can travel by private vehicle    Help with stairs or ramp for entrance;Assist for transportation;Assistance with cooking/housework      Equipment Recommendations  None recommended by PT    Recommendations for Other Services       Precautions / Restrictions Precautions Precautions: Fall Restrictions Weight Bearing Restrictions: Yes LLE Weight Bearing: Partial weight bearing LLE Partial Weight Bearing Percentage or Pounds: 50     Mobility  Bed Mobility Overal bed mobility: Modified Independent                  Transfers Overall transfer level: Modified independent Equipment used: Crutches                    Ambulation/Gait Ambulation/Gait assistance: Supervision, Modified independent (Device/Increase time) Gait Distance (Feet): 400 Feet Assistive device: Crutches Gait Pattern/deviations: Step-to pattern        General Gait Details: step to gait pattern, good UE support on crutches, no LOB or unsteadiness observed, pt verbalizes PWB status appropriately and appears to adhere   Stairs             Wheelchair Mobility     Tilt Bed    Modified Rankin (Stroke Patients Only)       Balance                                            Cognition Arousal/Alertness: Awake/alert Behavior During Therapy: WFL for tasks assessed/performed Overall Cognitive Status: Within Functional Limits for tasks assessed                                          Exercises      General Comments        Pertinent Vitals/Pain Pain Assessment Pain Assessment: No/denies pain Pain Intervention(s): Monitored during session, Repositioned    Home Living                          Prior Function            PT Goals (current goals can now be found in the care plan section) Progress towards PT goals: Goals met/education completed, patient discharged from  PT    Frequency    Min 1X/week      PT Plan Other (comment) (d/c from Acute PT)    Co-evaluation              AM-PAC PT "6 Clicks" Mobility   Outcome Measure  Help needed turning from your back to your side while in a flat bed without using bedrails?: None Help needed moving from lying on your back to sitting on the side of a flat bed without using bedrails?: None Help needed moving to and from a bed to a chair (including a wheelchair)?: A Little Help needed standing up from a chair using your arms (e.g., wheelchair or bedside chair)?: A Little Help needed to walk in hospital room?: A Little Help needed climbing 3-5 steps with a railing? : A Little 6 Click Score: 20    End of Session Equipment Utilized During Treatment: Gait belt Activity Tolerance: Patient tolerated treatment well Patient left: in bed;with call bell/phone within reach;with nursing/sitter in room   PT Visit Diagnosis:  Difficulty in walking, not elsewhere classified (R26.2)     Time: 9562-1308 PT Time Calculation (min) (ACUTE ONLY): 8 min  Charges:    $Gait Training: 8-22 mins PT General Charges $$ ACUTE PT VISIT: 1 Visit                    Paulino Door, DPT Physical Therapist Acute Rehabilitation Services Office: [phone]    Janan Halter Payson 09/17/2022, 4:01 PM

## 2022-09-17 NOTE — Progress Notes (Signed)
PROGRESS NOTE  [name]  DOB: [birth year]  PCP: [name], MD OZH:[id number]  DOA: 09/14/2022  LOS: 2 days  Hospital Day: 4  Brief narrative: Justin Suarez is a 67 y.o. 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 Charlann Boxer. 7/25, patient presented to the ED with complaint of severe epigastric pain, nausea, vomiting.   Drinks about 2-3 beers a day.  Last alcohol use was the day prior.  In the ED, patient was afebrile, initial blood pressure was elevated to 222/97. Initial labs showed WC count of 11.3, hemoglobin 12.2, renal function normal, lipase elevated to 556, AST/ALT/alk phos elevated to 63/96/294. CT abdomen pelvis showed a masslike enlargement of the pancreatic head similar to prior CT and MRI from 2022-findings likely representing acute pancreatitis but unable to exclude a mass.  Calcifications in the pancreas compatible with chronic pancreatitis.  Stable intrahepatic extremity biliary duct dilatation likely due to postcholecystectomy status  Patient required multiple rounds of IV opiates Started on IV fluids, IV analgesics, IV antiemetics Admitted to [facility]  Subjective: Patient was seen and examined this morning.   Lying on bed.  Abdominal pain improving.  Able to tolerate full liquid diet.   In the last 24 hours, he had fever spikes as high as 102.8 Along with MRCP findings from yesterday, concern of acute colitis.  Hospital course: Acute on chronic alcoholic pancreatitis Presented with severe epigastric pain, nausea, vomiting  Lipase elevated over 500.   CT abdomen with findings of acute pancreatitis.   Currently on conservative management with IV fluid, IV analgesics, IV antiemetics Abdomen pain improving.  Lipase level improving. Tolerating full liquid diet.  Recent Labs  Lab 09/14/22 2235  09/15/22 0609 09/16/22 0459 09/17/22 0416  LIPASE 556* 351* 96* 49   Suspect acute cholangitis Complex pancreatic mass Elevated liver enzymes Diffuse hepatic steatosis 7/27, MRCP showed complex mass/fluid collection seen in the head and neck of the pancreas also was a benign stricture of the distal common bile duct with intrahepatic biliary duct dilatation and severe dilation of the pancreatic duct. GI following. Will likely need ERCP. Given fever spikes, need to suspect acute cholangitis. Start IV Zosyn today. Recent Labs  Lab 09/14/22 2235 09/15/22 0609 09/16/22 0459 09/17/22 0416  WBC 11.3* 7.4 5.1 5.0    Recent Labs  Lab 09/14/22 2235 09/15/22 0609 09/16/22 0459 09/17/22 0416  AST 63* 117* 306* 137*  ALT 96* 90* 218* 155*  ALKPHOS 294* 248* 346* 342*  BILITOT 0.7 1.0 3.1* 2.1*  PROT 7.4 5.8* 5.9* 5.9*  ALBUMIN 3.9 2.9* 2.9* 2.9*  LIPASE 556* 351* 96* 49  PLT 335 230 198 191   Chronic alcoholism Impending withdrawal Drinks about 2-3 beers a day probably putting it politely.  Last use was the day prior to admission Watch out for withdrawal symptoms CIWA protocol to continue  Hypertensive urgency Initial blood pressure was significant elevated over 200 and remained elevated for several hours 170s this morning Gradually improving on metoprolol and amlodipine.  Continue to monitor.   Elevated troponin Aortic atherosclerosis Troponin was mildly elevated in the setting of significant CAD risk factors including HTN, smoking  Denies any anginal symptoms. Likely demand ischemia in the setting of pain and hypertensive urgency Echocardiogram with normal EF, mild LVH and no wall motion abnormality Continue to monitor on telemetry Primary prophylaxis aspirin, statin would help, if  no evidence of liver cirrhosis on MRI. Recent Labs    09/14/22 2235 09/15/22 0025  TROPONINIHS 14 43*   Current smoker Counseled to quit. Nicotine patch offered  H/o DVT/PE Continue  Xarelto   Osteoarthritis Recent left hip fracture s/p IM nailing 7/18 -Dr. Charlann Boxer. Would avoid NSAIDs given high risk of GI bleeding in the setting of alcohol use Continue oxycodone as needed, bowel regimen   Mobility: Encourage ambulation  Goals of care   Code Status: Full Code     DVT prophylaxis:  rivaroxaban (XARELTO) tablet 10 mg Start: 09/15/22 0800 rivaroxaban (XARELTO) tablet 10 mg   Antimicrobials: None Fluid: Currently on NS 75 ml/hr Consultants: None Family Communication: None at bedside  Status: Inpatient Level of care:  Telemetry   Patient from: Home Anticipated d/c to: Hopefully home in 1 to 2 days Needs to continue in-hospital care:  Ongoing conservative management with pancreatitis and   Diet:  Diet Order             Diet NPO time specified  Diet effective midnight           Diet full liquid Fluid consistency: Thin  Diet effective now                   Scheduled Meds:  amLODipine  10 mg Oral Daily   folic acid  1 mg Oral Daily   metoprolol tartrate  12.5 mg Oral BID   multivitamin with minerals  1 tablet Oral Daily   rivaroxaban  10 mg Oral Q breakfast   thiamine  100 mg Oral Daily   Or   thiamine  100 mg Intravenous Daily    PRN meds: acetaminophen, hydrALAZINE, HYDROmorphone (DILAUDID) injection, LORazepam **OR** LORazepam, melatonin, oxyCODONE, polyethylene glycol, prochlorperazine   Infusions:   piperacillin-tazobactam (ZOSYN)  IV 3.375 g (09/17/22 0956)    Antimicrobials: Anti-infectives (From admission, onward)    Start     Dose/Rate Route Frequency Ordered Stop   09/17/22 1000  piperacillin-tazobactam (ZOSYN) IVPB 3.375 g        3.375 g 12.5 mL/hr over 240 Minutes Intravenous Every 8 hours 09/17/22 0830         Nutritional status:  Body mass index is 22.02 kg/m.          Objective: Vitals:   09/17/22 1302 09/17/22 1509  BP: (!) 110/90 134/63  Pulse: 65 69  Resp: 20 20  Temp: 98.3 F (36.8 C) 98 F (36.7  C)  SpO2: 98% 100%    Intake/Output Summary (Last 24 hours) at 09/17/2022 1525 Last data filed at 09/17/2022 1300 Gross per 24 hour  Intake 2925.03 ml  Output 4150 ml  Net -1224.97 ml   Filed Weights   09/16/22 0043  Weight: 71.6 kg   Weight change:  Body mass index is 22.02 kg/m.   Physical Exam: General exam: Pleasant, elderly Caucasian male. Skin: No rashes, lesions or ulcers. HEENT: Atraumatic, normocephalic, no obvious bleeding Lungs: Clear to auscultation bilaterally. CVS: Regular rate and rhythm, no murmur GI/Abd soft, tenderness improving in epigastrium, bowel sound present CNS: Alert, awake, oriented x 3 Psychiatry: Mood appropriate Extremities: No edema, no calf tenderness  Data Review: I have personally reviewed the laboratory data and studies available.  F/u labs ordered Unresulted Labs (From admission, onward)     Start     Ordered   09/16/22 0500  CBC with Differential/Platelet  Daily,   R     Question:  Specimen collection method  Answer:  Lab=Lab collect   09/15/22 1157   09/16/22 0500  Comprehensive metabolic panel  Daily,   R     Question:  Specimen collection method  Answer:  Lab=Lab collect   09/15/22 1157   09/16/22 0500  Lipase, blood  Daily,   R     Question:  Specimen collection method  Answer:  Lab=Lab collect   09/15/22 1157            Total time spent in review of labs and imaging, patient evaluation, formulation of plan, documentation and communication with family: 45 minutes  Signed, Lorin Glass, MD [facility] 09/17/2022

## 2022-09-17 NOTE — Progress Notes (Signed)
Pharmacy Antibiotic Note  Justin Suarez is a 67 y.o. male admitted on 09/14/2022 with fever, pancreatitis and CBD obstruction and covering for possible cholangitis.  Pharmacy has been consulted for Zosyn dosing.  Plan: Zosyn 3.375g IV q8h (4 hour infusion).  Height: 5\' 11"  (180.3 cm) Weight: 71.6 kg (157 lb 14.4 oz) IBW/kg (Calculated) : 75.3  Temp (24hrs), Avg:99.8 F (37.7 C), Min:98.1 F (36.7 C), Max:102.8 F (39.3 C)  Recent Labs  Lab 09/14/22 2235 09/15/22 0609 09/16/22 0459 09/17/22 0416  WBC 11.3* 7.4 5.1 5.0  CREATININE 0.80 0.48* 0.63 0.71    Estimated Creatinine Clearance: 90.7 mL/min (by C-G formula based on SCr of 0.71 mg/dL).    No Known Allergies    Dosage will likely remain stable at above dosage and need for further dosage adjustment appears unlikely at present.    Will sign off at this time.  Please reconsult if a change in clinical status warrants re-evaluation of dosage.    Thank you for allowing pharmacy to be a part of this patient's care.   Adalberto Cole, PharmD, BCPS 09/17/2022 8:31 AM

## 2022-09-18 ENCOUNTER — Encounter (HOSPITAL_COMMUNITY)
Admission: EM | Disposition: A | Discharge status: Home Health | Payer: Self-pay | Source: Home / Self Care | Attending: Internal Medicine

## 2022-09-18 ENCOUNTER — Inpatient Hospital Stay (HOSPITAL_COMMUNITY): Payer: 59

## 2022-09-18 DIAGNOSIS — K852 Alcohol induced acute pancreatitis without necrosis or infection: Secondary | ICD-10-CM | POA: Diagnosis not present

## 2022-09-18 DIAGNOSIS — R112 Nausea with vomiting, unspecified: Secondary | ICD-10-CM

## 2022-09-18 DIAGNOSIS — R1013 Epigastric pain: Secondary | ICD-10-CM | POA: Diagnosis not present

## 2022-09-18 DIAGNOSIS — Z7901 Long term (current) use of anticoagulants: Secondary | ICD-10-CM

## 2022-09-18 DIAGNOSIS — K859 Acute pancreatitis without necrosis or infection, unspecified: Secondary | ICD-10-CM

## 2022-09-18 HISTORY — PX: IR EMBO ART  VEN HEMORR LYMPH EXTRAV  INC GUIDE ROADMAPPING: IMG5450

## 2022-09-18 HISTORY — PX: IR ANGIOGRAM PELVIS SELECTIVE OR SUPRASELECTIVE: IMG661

## 2022-09-18 HISTORY — PX: IR ANGIOGRAM SELECTIVE EACH ADDITIONAL VESSEL: IMG667

## 2022-09-18 HISTORY — PX: IR US GUIDE VASC ACCESS RIGHT: IMG2390

## 2022-09-18 HISTORY — PX: IR ANGIOGRAM VISCERAL SELECTIVE: IMG657

## 2022-09-18 SURGERY — ENDOSCOPIC RETROGRADE CHOLANGIOPANCREATOGRAPHY (ERCP)
Anesthesia: General

## 2022-09-18 MED ORDER — MIDAZOLAM HCL 2 MG/2ML IJ SOLN
INTRAMUSCULAR | Status: AC
  Filled 2022-09-18: qty 4

## 2022-09-18 MED ORDER — FENTANYL CITRATE (PF) 100 MCG/2ML IJ SOLN
INTRAMUSCULAR | Status: AC
  Filled 2022-09-18: qty 4

## 2022-09-18 MED ORDER — FENTANYL CITRATE (PF) 100 MCG/2ML IJ SOLN
INTRAMUSCULAR | Status: AC | PRN
  Administered 2022-09-18 (×5): 50 ug via INTRAVENOUS

## 2022-09-18 MED ORDER — LIDOCAINE HCL 1 % IJ SOLN
20.0000 mL | Freq: Once | INTRAMUSCULAR | Status: AC
  Administered 2022-09-18: 10 mL via INTRADERMAL
  Filled 2022-09-18: qty 20

## 2022-09-18 MED ORDER — IOHEXOL 300 MG/ML  SOLN
100.0000 mL | Freq: Once | INTRAMUSCULAR | Status: AC | PRN
  Administered 2022-09-18: 54 mL via INTRA_ARTERIAL

## 2022-09-18 MED ORDER — IOHEXOL 300 MG/ML  SOLN
100.0000 mL | Freq: Once | INTRAMUSCULAR | Status: AC | PRN
  Administered 2022-09-18: 100 mL via INTRA_ARTERIAL

## 2022-09-18 MED ORDER — FENTANYL CITRATE (PF) 100 MCG/2ML IJ SOLN
INTRAMUSCULAR | Status: AC
  Filled 2022-09-18: qty 2

## 2022-09-18 MED ORDER — POTASSIUM CHLORIDE 10 MEQ/100ML IV SOLN
10.0000 meq | INTRAVENOUS | Status: AC
  Administered 2022-09-18 (×4): 10 meq via INTRAVENOUS
  Filled 2022-09-18 (×4): qty 100

## 2022-09-18 MED ORDER — MIDAZOLAM HCL 5 MG/5ML IJ SOLN
INTRAMUSCULAR | Status: AC | PRN
  Administered 2022-09-18: 1 mg via INTRAVENOUS

## 2022-09-18 MED ORDER — LIDOCAINE HCL 1 % IJ SOLN
INTRAMUSCULAR | Status: AC
  Filled 2022-09-18: qty 20

## 2022-09-18 MED ORDER — RIVAROXABAN 10 MG PO TABS
10.0000 mg | ORAL_TABLET | Freq: Every day | ORAL | Status: DC
  Administered 2022-09-19: 10 mg via ORAL
  Filled 2022-09-18: qty 1

## 2022-09-18 MED ORDER — MIDAZOLAM HCL 2 MG/2ML IJ SOLN
INTRAMUSCULAR | Status: AC | PRN
  Administered 2022-09-18 (×3): 1 mg via INTRAVENOUS

## 2022-09-18 MED ORDER — ORAL CARE MOUTH RINSE: 15.0000 mL | OROMUCOSAL | Status: DC | PRN

## 2022-09-18 MED ORDER — RIVAROXABAN 10 MG PO TABS: 10.0000 mg | ORAL_TABLET | Freq: Every day | ORAL | Status: DC

## 2022-09-18 MED ORDER — IOHEXOL 350 MG/ML SOLN
100.0000 mL | Freq: Once | INTRAVENOUS | Status: AC | PRN
  Administered 2022-09-18: 100 mL via INTRAVENOUS

## 2022-09-18 NOTE — Progress Notes (Signed)
PROGRESS NOTE  Justin Suarez  DOB: [birth year]  PCP: [name], MD GNF:[id number]  DOA: 09/14/2022  LOS: 3 days  Hospital Day: 5  Brief narrative: Justin Suarez is a 67 y.o. 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 Charlann Boxer. 7/25, patient presented to the ED with complaint of severe epigastric pain, nausea, vomiting.   Drinks about 2-3 beers a day.  Last alcohol use was the day prior.  In the ED, patient was afebrile, initial blood pressure was elevated to 222/97. Initial labs showed WC count of 11.3, hemoglobin 12.2, renal function normal, lipase elevated to 556, AST/ALT/alk phos elevated to 63/96/294. CT abdomen pelvis showed a masslike enlargement of the pancreatic head similar to prior CT and MRI from 2022-findings likely representing acute pancreatitis but unable to exclude a mass.  Calcifications in the pancreas compatible with chronic pancreatitis.  Stable intrahepatic extremity biliary duct dilatation likely due to postcholecystectomy status  Patient required multiple rounds of IV opiates Started on IV fluids, IV analgesics, IV antiemetics Admitted to [facility]  Subjective: Patient was seen and examined this morning.   Lying on bed.  Abdominal pain improving.  Able to tolerate full liquid diet.   In the last 24 hours, he had fever spikes as high as 102.8 Along with MRCP findings from yesterday, concern of acute colitis.  Hospital course: Acute on chronic alcoholic pancreatitis Presented with severe epigastric pain, nausea, vomiting  Lipase was elevated over 500.   CT abdomen with findings of acute pancreatitis.   Currently on conservative management with IV fluid, IV analgesics, IV antiemetics Abdomen pain improving.  Lipase level improving. GI following.  Currently on full liquid diet. On IV Zosyn  since fever episode on 7/27.  No recurrence since then. 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 GI and IR consulted. 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  IR plans to obtain a CTA abdomen pelvis with venous phase for further evaluation. Recent Labs  Lab 09/14/22 2235 09/15/22 0609 09/16/22 0459 09/17/22 0416 09/18/22 0337  WBC 11.3* 7.4 5.1 5.0 4.6    Recent Labs  Lab 09/14/22 2235 09/15/22 0609 09/16/22 0459 09/17/22 0416 09/18/22 0337  AST 63* 117* 306* 137* 53*  ALT 96* 90* 218* 155* 108*  ALKPHOS 294* 248* 346* 342* 302*  BILITOT 0.7 1.0 3.1* 2.1* 1.0  PROT 7.4 5.8* 5.9* 5.9* 6.1*  ALBUMIN 3.9 2.9* 2.9* 2.9* 2.8*  LIPASE 556* 351* 96* 49 39  PLT 335 230 198 191 219   Chronic alcoholism Impending withdrawal Drinks about 2-3 beers a day probably putting it politely.  Last use was the day prior to admission Watch out for withdrawal symptoms CIWA protocol to continue  Hypertensive urgency Initial blood pressure was significant elevated over 200 and remained elevated for several hours 170s this morning Gradually improving on metoprolol and amlodipine.  Continue to monitor.   Elevated troponin Aortic  atherosclerosis Troponin was mildly elevated in the setting of significant CAD risk factors including HTN, smoking  Denies any anginal symptoms. Likely demand ischemia in the setting of pain and hypertensive urgency Echocardiogram with normal EF, mild LVH and no wall motion  abnormality Continue to monitor on telemetry Primary prophylaxis with statin would help, if no evidence of liver cirrhosis on MRI.  Current smoker Counseled to quit. Nicotine patch offered  H/o DVT/PE Previously on Xarelto.  Currently on hold for potential need of IR intervention.  Last dose yesterday 7/28   Osteoarthritis Recent left hip fracture s/p IM nailing 7/18 -Dr. Charlann Boxer. Would avoid NSAIDs given high risk of GI bleeding in the setting of alcohol use Continue oxycodone as needed, bowel regimen   Mobility: Encourage ambulation  Goals of care   Code Status: Full Code     DVT prophylaxis:  rivaroxaban (XARELTO) tablet 10 mg Start: 09/19/22 0800 rivaroxaban (XARELTO) tablet 10 mg   Antimicrobials: None Fluid: Currently on NS 75 ml/hr.  Will reduce to 50 mill per hour today Consultants: None Family Communication: None at bedside  Status: Inpatient Level of care:  Telemetry   Patient from: Home Anticipated d/c to: Pending clinical course Needs to continue in-hospital care:  IR to determine need of any intervention   Diet:  Diet Order             Diet full liquid Fluid consistency: Thin  Diet effective now                   Scheduled Meds:  amLODipine  10 mg Oral Daily   folic acid  1 mg Oral Daily   metoprolol tartrate  12.5 mg Oral BID   multivitamin with minerals  1 tablet Oral Daily   [START ON 09/19/2022] rivaroxaban  10 mg Oral Q breakfast   thiamine  100 mg Oral Daily   Or   thiamine  100 mg Intravenous Daily    PRN meds: acetaminophen, hydrALAZINE, HYDROmorphone (DILAUDID) injection, melatonin, mouth rinse, oxyCODONE, polyethylene glycol, prochlorperazine   Infusions:   sodium chloride 75 mL/hr at 09/18/22 0653   piperacillin-tazobactam (ZOSYN)  IV 3.375 g (09/18/22 1125)    Antimicrobials: Anti-infectives (From admission, onward)    Start     Dose/Rate Route Frequency Ordered Stop   09/17/22 1000  piperacillin-tazobactam (ZOSYN) IVPB  3.375 g        3.375 g 12.5 mL/hr over 240 Minutes Intravenous Every 8 hours 09/17/22 0830         Nutritional status:  Body mass index is 22.02 kg/m.          Objective: Vitals:   09/18/22 0319 09/18/22 1205  BP: 134/74 133/62  Pulse: (!) 57 (!) 58  Resp: 18 17  Temp: 97.6 F (36.4 C) 98.4 F (36.9 C)  SpO2: 99% 99%    Intake/Output Summary (Last 24 hours) at 09/18/2022 1431 Last data filed at 09/18/2022 1411 Gross per 24 hour  Intake 2514.76 ml  Output 3645 ml  Net -1130.24 ml   Filed Weights   09/16/22 0043  Weight: 71.6 kg   Weight change:  Body mass index is 22.02 kg/m.   Physical Exam: General exam: Pleasant, elderly Caucasian male. Skin: No rashes, lesions or ulcers. HEENT: Atraumatic, normocephalic, no obvious bleeding Lungs: Clear to auscultation bilaterally. CVS: Regular rate and rhythm, no murmur GI/Abd soft, tenderness improving in epigastrium, bowel sound present CNS: Alert, awake, oriented x 3 Psychiatry: Mood appropriate Extremities: No edema, no calf  tenderness  Data Review: I have personally reviewed the laboratory data and studies available.  F/u labs ordered Unresulted Labs (From admission, onward)     Start     Ordered   09/18/22 0957  Cancer antigen 19-9  Add-on,   AD       Question:  Specimen collection method  Answer:  Lab=Lab collect   09/18/22 0958            Total time spent in review of labs and imaging, patient evaluation, formulation of plan, documentation and communication with family: 45 minutes  Signed, Lorin Glass, MD [facility] 09/18/2022

## 2022-09-18 NOTE — Progress Notes (Addendum)
Patient ID: Justin Suarez, male   DOB: 09/22/1955, 67 y.o.   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 recommend IR consult-although the lesion very well could be consistent with history of pancreatitis with the appearance of the mass lesion recommend continued surveillance to confirm resolution and exclude secondary pathology Presumed benign stricture distal common bile duct with intrahepatic ductal dilation, there is also severe dilation of the pancreatic duct with stricture of the pancreatic head-the lesion is also associated with occlusion of the SMV and narrowing of the proximal portal vein with numerous collaterals, trace ascites  Labs-WBC 4.6/hemoglobin 9.8/hematocrit 29.8/platelets 219 Lipase 39 Sodium 134/potassium 3.2/BUN 7/creatinine 0.66 T. bili 1.0/alk phos 302/AST 53/ALT 108  improved  Long talk with patient this morning regarding rationale for canceling the ERCP etc.  He says his pain has definitely improved since admission and is now more sore like a "boil" he rates it a 4-5 out of 10, he has been using pain medication on a regular basis.  No nausea or vomiting and tolerated full liquids yesterday without increase in pain   Objective   Vital signs in last 24 hours: Temp:  [97.6 F (36.4 C)-98.3 F (36.8 C)] 97.6 F (36.4 C) (07/29 0319) Pulse Rate:  [57-69] 57 (07/29 0319) Resp:  [16-20] 18 (07/29 0319) BP: (110-142)/(63-90) 134/74 (07/29 0319) SpO2:  [98 %-100 %] 99 % (07/29 0319) Last BM Date :  09/13/22 General:    Thin older white male in NAD Heart:  Regular rate and rhythm; no murmurs Lungs: Respirations even and unlabored, lungs CTA bilaterally Abdomen:  Soft, distended, bowel sounds are present, he is tender across the upper abdomen no rebound normal bowel sounds. Extremities: Left hip dressed, status post recent left hip repair Neurologic:  Alert and oriented,  grossly normal neurologically. Psych:  Cooperative. Normal mood and affect.  Intake/Output from previous day: 07/28 0701 - 07/29 0700 In: 1764.8 [P.O.:860; I.V.:769.5; IV Piggyback:135.2] Out: 3870 [Urine:3870] Intake/Output this shift: No intake/output data recorded.  Lab Results: Recent Labs    09/16/22 0459 09/17/22 0416 09/18/22 0337  WBC 5.1 5.0 4.6  HGB 9.8* 9.6* 9.8*  HCT 30.1* 28.8* 29.8*  PLT 198 191 219   BMET Recent Labs    09/16/22 0459 09/17/22 0416 09/18/22 0337  NA 132* 129* 134*  K 3.8 4.0 3.2*  CL 99 95* 100  CO2 25 26 26   GLUCOSE 95 109* 108*  BUN 7* 8 7*  CREATININE 0.63 0.71 0.66  CALCIUM 8.4* 8.3* 8.3*   LFT Recent Labs    09/18/22 0337  PROT 6.1*  ALBUMIN 2.8*  AST 53*  ALT 108*  ALKPHOS 302*  BILITOT 1.0   PT/INR No results for input(s): "LABPROT", "INR" in the last 72 hours.  Studies/Results: MR ABDOMEN MRCP W WO CONTAST  Result Date: 09/16/2022 CLINICAL DATA:  Jaundice. History of pancreatitis and pancreatic cyst. EXAM: MRI ABDOMEN WITHOUT AND WITH CONTRAST (INCLUDING MRCP) TECHNIQUE: Multiplanar multisequence MR imaging of the abdomen was performed both before and after the administration of intravenous contrast.  Heavily T2-weighted images of the biliary and pancreatic ducts were obtained, and three-dimensional MRCP images were rendered by post processing. CONTRAST:  7mL GADAVIST GADOBUTROL 1 MMOL/ML IV SOLN COMPARISON:  CT 09/14/2022 and older.  MRI 01/02/2022 and older FINDINGS: Lower chest: Mild linear opacity seen in the right lung base likely scar or  atelectasis. No pleural effusion. Hepatobiliary: No enhancing space-occupying liver lesion. Patent portal vein. There is diffuse biliary ductal dilatation which is slightly increased from November 2023. The common duct has a diameter proximally approaching 13 mm but tapers abruptly at the pancreatic head. There is no mural enhancement or nodularity. This could be a benign stricture with a history of pancreatitis. Please correlate for any known history. Pancreas: Severe atrophy of the pancreas with ductal dilatation including side-branch dilatation diffusely. There is stricture, narrowing of the pancreatic head along the pancreatic duct where there is complex heterogeneous masslike area which has a mixed signal on precontrast T1 and T2 with some central bright T2 signal. This area measures a proximally 4.4 x 4.5 cm and could be the sequela of previous pancreatitis. Along the posterior aspect of the structure is a rounded area measuring 12 mm in AP diameter which shows to be an area of enhancement comparing pre and postcontrast. 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 The lesion in question overall does cause mass effect along surrounding structures causing narrowing of the portal venous confluence. There is a occlusion of the SMV. Several left upper quadrant collateral vessels are identified. Spleen:  Within normal limits in size and appearance. Adrenals/Urinary Tract: No masses identified. No evidence of hydronephrosis. Stomach/Bowel: Visualized portions within the abdomen are unremarkable. Vascular/Lymphatic: Circumaortic left renal vein. Atherosclerotic changes along the aorta. Normal caliber aorta and IVC. No specific abnormal lymph node enlargement identified. Other: There is some stranding along the central mesentery as well as upper retroperitoneum. Trace right upper quadrant  ascites. Musculoskeletal: Curvature of the spine with degenerative changes. IMPRESSION: Complex mass, fluid collection once again seen extending caudal to the head and neck of the pancreas. 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 Recommend intervention consultation. Although the lesion very well could be consistent with the history of pancreatitis, with the appearance of the mass lesion, recommend continued surveillance to confirm resolution and exclude secondary pathology. 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 There is also severe dilatation of the pancreatic duct with stricture of the pancreatic head. The lesion also associated with occlusion of the SMV, narrowing of the proximal portal vein with numerous collateral vessels. Trace ascites and mesenteric stranding. Electronically Signed   By: Karen Kays M.D.   On: 09/16/2022 14:18   MR 3D Recon At Scanner  Result Date: 09/16/2022 CLINICAL DATA:  Jaundice. History of pancreatitis and pancreatic cyst. EXAM: MRI ABDOMEN WITHOUT AND WITH CONTRAST (INCLUDING MRCP) TECHNIQUE: Multiplanar multisequence MR imaging of the abdomen was performed both before and after the administration of intravenous contrast. Heavily T2-weighted images of the biliary and pancreatic ducts were obtained, and three-dimensional MRCP images were rendered by post processing. CONTRAST:  7mL GADAVIST GADOBUTROL 1 MMOL/ML IV SOLN COMPARISON:  CT 09/14/2022  and older.  MRI 01/02/2022 and older FINDINGS: Lower chest: Mild linear opacity seen in the right lung base likely scar or atelectasis. No pleural effusion. Hepatobiliary: No enhancing space-occupying liver lesion. Patent portal vein. There is diffuse biliary ductal dilatation  which is slightly increased from November 2023. The common duct has a diameter proximally approaching 13 mm but tapers abruptly at the pancreatic head. There is no mural enhancement or nodularity. This could be a benign stricture with a history of pancreatitis. Please correlate for any known history. Pancreas: Severe atrophy of the pancreas with ductal dilatation including side-branch dilatation diffusely. There is stricture, narrowing of the pancreatic head along the pancreatic duct where there is complex heterogeneous masslike area which has a mixed signal on precontrast T1 and T2 with some central bright T2 signal. This area measures a proximally 4.4 x 4.5 cm and could be the sequela of previous pancreatitis. Along the posterior aspect of the structure is a rounded area measuring 12 mm in AP diameter which shows to be an area of enhancement comparing pre and postcontrast. 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 The lesion in question overall does cause mass effect along surrounding structures causing narrowing of the portal venous confluence. There is a occlusion of the SMV. Several left upper quadrant collateral vessels are identified. Spleen:  Within normal limits in size and appearance. Adrenals/Urinary Tract: No masses identified. No evidence of hydronephrosis. Stomach/Bowel: Visualized portions within the abdomen are unremarkable. Vascular/Lymphatic: Circumaortic left renal vein. Atherosclerotic changes along the aorta. Normal caliber aorta and IVC. No specific abnormal lymph node enlargement identified. Other: There is some stranding along the central mesentery as well as upper retroperitoneum. Trace right upper quadrant ascites. Musculoskeletal: Curvature of the spine with degenerative changes. IMPRESSION: Complex mass, fluid collection once again seen extending caudal to the  head and neck of the pancreas. 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 Recommend intervention consultation. Although the lesion very well could be consistent with the history of pancreatitis, with the appearance of the mass lesion, recommend continued surveillance to confirm resolution and exclude secondary pathology. 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 There is also severe dilatation of the pancreatic duct with stricture of the pancreatic head. The lesion also associated with occlusion of the SMV, narrowing of the proximal portal vein with numerous collateral vessels. Trace ascites and mesenteric stranding. Electronically Signed   By: Karen Kays M.D.   On: 09/16/2022 14:18       Assessment / Plan:    #25 67 year old white male admitted with severe upper abdominal pain nausea and vomiting acute onset, in setting of known chronic pancreatitis. he is status post left hip repair for fracture on 09/07/2022.  Workup on admission with labs and imaging consistent with acute exacerbation of chronic pancreatitis. He does not have parameters consistent with severe pancreatitis and pain has improved since admission  MRI/MRCP shows severe atrophy of the pancreas, chronic pancreatitis, distal CBD stricture and pancreatic duct stricture as well as a complex mass/fluid collection extending caudal to the head and neck of the pancreas there is an associated rounded area of  enhancement concerning for a 12 mm visceral aneurysm   ERCP had been planned for today per Dr. Elnoria Howard, after review of imaging, ERCP is canceled for today.  Will ask IR to see for further evaluation of the possible 12 mm aneurysm.  Will hold Xarelto today in event IR may want to  schedule procedure.  #2 chronic anticoagulation-history of prior DVT/PE-holding Xarelto today #3 status post very recent left hip repair secondary to fracture 09/07/2022-patient says he was put back on Xarelto after the hip surgery.  4 hypokalemia- correct  Plan; as above Continue full liquids today until he is not requiring around-the-clock dosing of IV analgesics IR consult today Hold Xarelto today I do not see an indication for continuing Zosyn, will discontinue Patient may eventually need referral for surgery/pancreaticojejunostomy, versus pancreatic duct stenting.   Principal Problem:   Pancreatitis     LOS: 3 days   Amy EsterwoodPA-C  09/18/2022, 8:43 AM  GI ATTENDING  Interval history data reviewed.  Patient personally seen and examined.  Agree with interval progress note as outlined above.  Patient still having discomfort, but much less.  Feeling better today.  Tolerating liquid diet.  I discussed with him, as did Amy [employer], the findings on his imaging study.  We discussed the cystic pancreatic lesion, biliary dilation, pancreatic duct dilation, and question of pseudoaneurysm.  The plan is as follows:   1.  Continue with supportive care measures.  Advance diet as tolerated 2.  No alcohol forever 3.  IR opinion regarding possible pseudoaneurysm 4.  Will need close outpatient follow-up.  At some point, I suspect he will need repeat EUS to reinterrogate the head/neck of the pancreas mass lesion.  Hopefully this is benign/inflammatory, as it was in the past.  Will follow.  Wilhemina Bonito. Eda Keys., M.D. [facility] of Gastroenterology

## 2022-09-18 NOTE — Progress Notes (Signed)
Mobility Specialist - Progress Note   09/18/22 1428  Mobility  Activity Ambulated with assistance in hallway  Level of Assistance Standby assist, set-up cues, supervision of patient - no hands on  Assistive Device Crutches  Distance Ambulated (ft) 800 ft  Range of Motion/Exercises Active  LLE Weight Bearing PWB  Activity Response Tolerated well  Mobility Referral Yes  $Mobility charge 1 Mobility  Mobility Specialist Start Time (ACUTE ONLY) 1415  Mobility Specialist Stop Time (ACUTE ONLY) 1428  Mobility Specialist Time Calculation (min) (ACUTE ONLY) 13 min   Pt was found on chair and agreeable to ambulate. No complaints with session and at EOS returned to chair with all needs met. Call bell in reach.  Billey Chang Mobility Specialist

## 2022-09-18 NOTE — Procedures (Signed)
Interventional Radiology Procedure Note  Procedure:  1) Pelvic angiogram 2) Selective catheterization and angiography of the superior mesenteric, inferior pancreaticoduodenal arcade, celiac, gastroduodenal, and superior pancreaticoduodenal arcade arteries 3) Coil embolization of superior pancreaticoduodenal arcade  Findings: Please refer to procedural dictation for full description.  No evidence of active extravasation visualized.  Prophylactic coil embolization of superior pancreaticoduodenal arcade.  Ultrasound guided 6 Fr right CFA Angioseal closure.  Complications: None immediate  Estimated Blood Loss: < 5 mL  Recommendations: -30 minutes flat followed by 2 hours bedrest -continue to monitor H/H -may experience increased abdominal pain after embolization -if abdominal pain severe with concomitant acute anemia, recommend repeat CTA abdomen/pelvis GI bleed protocol and notify IR  -IR will follow   Marliss Coots, MD Pager: [phone]

## 2022-09-18 NOTE — Care Management Important Message (Signed)
Important Message  Patient Details IM Letter given Name: Justin Suarez MRN: [id number] Date of Birth: May 21, 1955   Medicare Important Message Given:  Yes     Caren Macadam 09/18/2022, 3:30 PM

## 2022-09-18 NOTE — Consult Note (Signed)
Chief Complaint: Abdominal pain. 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  Referring Physician(s): Sondra Come PA  Supervising Physician: Ruel Favors  Patient Status: [facility] - In-pt  History of Present Illness: Justin Suarez is a 67 y.o. male inpatient. History of alcohol abuse, pancreatitis, HTN. Recent left hip surgery. DVT/PE on xarelto. Presented to the ED at [facility] on 7.26.24 with epigastric pain, nausea and vomiting. MR from 7.27.24 reads Complex mass, fluid collection once again seen extending caudal to the head and neck of the pancreas. 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 Recommend intervention consultation. Team is requesting evaluation for possible pancreatic aneurysm embolization. 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  Patient alert and laying in bed,calm.Endorses diffuse lower abdomen pain. Denies any fevers, headache, chest pain, SOB, cough, nausea, vomiting or bleeding. Return precautions and treatment recommendations and follow-up discussed with the patient  who is agreeable with the plan.    Past Medical History:  Diagnosis Date   Abnormal CT scan, chest 06/2011   multiple lung nodules; needs repeat study in 3 to 12 months.   Arthritis    Hands   Chronic anticoagulation    Common bile duct dilation    COPD (chronic obstructive pulmonary disease) (HCC)    Pt denies   DVT (deep venous thrombosis) (HCC)    Gallstone pancreatitis    GERD (gastroesophageal reflux disease)    Hepatitis C    History of hiatal hernia    HTN (hypertension)    Noncompliance    Pancreatitis    Pancreatitis 06/2020   Pulmonary embolus (HCC)    Tick bite  07/2011   Tobacco abuse     Past Surgical History:  Procedure Laterality Date   ANKLE SURGERY Right 1998   3 surgeries.   CHOLECYSTECTOMY N/A 05/24/2020   Procedure: LAPAROSCOPIC CHOLECYSTECTOMY WITH INTRAOPERATIVE CHOLANGIOGRAM;  Surgeon: Gaynelle Adu, MD;  Location: [facility];  Service: General;  Laterality: N/A;   ESOPHAGOGASTRODUODENOSCOPY (EGD) WITH PROPOFOL N/A 04/01/2020   Procedure: ESOPHAGOGASTRODUODENOSCOPY (EGD) WITH PROPOFOL;  Surgeon: Rachael Fee, MD;  Location: [facility];  Service: Endoscopy;  Laterality: N/A;   EUS N/A 04/01/2020   Procedure: UPPER ENDOSCOPIC ULTRASOUND (EUS) RADIAL;  Surgeon: Rachael Fee, MD;  Location: [facility];  Service: Endoscopy;  Laterality: N/A;   FINGER AMPUTATION Left 1970   first part of middle finger removed   FOOT SURGERY Right    x 3 from a fall   INGUINAL HERNIA REPAIR Right    years ago   INTRAMEDULLARY (IM) NAIL INTERTROCHANTERIC Left 09/07/2022   Procedure: INTRAMEDULLARY (IM) NAIL INTERTROCHANTERIC;  Surgeon: Durene Romans, MD;  Location: [facility];  Service: Orthopedics;  Laterality: Left;    Allergies: Patient has no known allergies.  Medications: Prior to Admission medications   Medication Sig Start Date End Date Taking? Authorizing Provider  methocarbamol (ROBAXIN) 500 MG tablet Take 1 tablet (500 mg total) by mouth every 6 (six) hours as needed for muscle spasms. 09/08/22  Yes Cassandria Anger, PA-C  metoprolol tartrate (LOPRESSOR) 25 MG tablet Take 0.5 tablets (12.5 mg total) by mouth 2 (two) times daily. 09/08/22  Yes Rhetta Mura, MD  oxyCODONE (OXY IR/ROXICODONE) 5 MG immediate release tablet  Take 1 tablet (5 mg total) by mouth every 4 (four) hours as needed for severe pain. 09/08/22  Yes Rosalene Billings R, PA-C  polyethylene glycol (MIRALAX / GLYCOLAX) 17 g packet Take 17 g by mouth 2 (two) times daily. 09/08/22  Yes Cassandria Anger, PA-C  rivaroxaban (XARELTO) 10 MG TABS tablet Take 1 tablet (10 mg total)  by mouth daily with breakfast for 14 days. After completing, take aspirin 81 mg twice daily for 1 month 09/08/22 09/22/22 Yes Cassandria Anger, PA-C  senna-docusate (SENOKOT-S) 8.6-50 MG tablet Take 2 tablets by mouth at bedtime as needed for up to 14 days for mild constipation. 09/08/22 09/22/22 Yes Cassandria Anger, PA-C     Family History  Problem Relation Age of Onset   Kidney disease Mother        ESRD dialysis   Diabetes Mother    Heart disease Mother        CHF   Heart failure Mother    Heart disease Father    Heart disease Sister 66       CABG   Coronary artery disease Sister    Coronary artery disease Sister    Heart disease Sister    Cerebral aneurysm Sister    Atrial fibrillation Brother    Hypertension Brother    Esophageal cancer Neg Hx    Colon cancer Neg Hx    Rectal cancer Neg Hx    Stomach cancer Neg Hx     Social History   Socioeconomic History   Marital status: Divorced    Spouse name: Not on file   Number of children: 0   Years of education: Not on file   Highest education level: Not on file  Occupational History   Occupation: retired    Comment: Long distance truck driver  Tobacco Use   Smoking status: Every Day    Current packs/day: 0.25    Average packs/day: 0.3 packs/day for 43.0 years (10.8 ttl pk-yrs)    Types: Cigarettes   Smokeless tobacco: Never   Tobacco comments:    1 ppd for 40 years.  Vaping Use   Vaping status: Never Used  Substance and Sexual Activity   Alcohol use: Yes    Comment: Occasional. 4-5 beers 12 oz./week.    Drug use: No   Sexual activity: Not Currently  Other Topics Concern   Not on file  Social History Narrative   Retired Naval architect    Social Determinants of Corporate investment banker Strain: Not on file  Food Insecurity: No Food Insecurity (09/15/2022)   Hunger Vital Sign    Worried About Running Out of Food in the Last Year: Never true    Ran Out of Food in the Last Year: Never true  Transportation Needs:  No Transportation Needs (09/15/2022)   PRAPARE - Administrator, Civil Service (Medical): No    Lack of Transportation (Non-Medical): No  Physical Activity: Not on file  Stress: Not on file  Social Connections: Not on file     Review of Systems: A 12 point ROS discussed and pertinent positives are indicated in the HPI above.  All other systems are negative.  Review of Systems  Constitutional:  Negative for fever.  HENT:  Negative for congestion.   Respiratory:  Negative for cough and shortness of breath.   Cardiovascular:  Negative for chest pain.  Gastrointestinal:  Positive for abdominal pain (diffuse lower pain).  Neurological:  Negative for headaches.  Psychiatric/Behavioral:  Negative for behavioral problems and confusion.     Vital Signs: BP 133/62 (BP Location: Left Arm)   Pulse (!) 58   Temp 98.4 F (36.9 C) (Oral)   Resp 17   Ht 5\' 11"  (1.803 m)   Wt 157 lb 14.4 oz (71.6 kg)   SpO2 99%   BMI 22.02 kg/m   Physical Exam Vitals and nursing note reviewed.  Constitutional:      Appearance: He is well-developed.  HENT:     Head: Normocephalic.  Cardiovascular:     Rate and Rhythm: Normal rate and regular rhythm.  Pulmonary:     Effort: Pulmonary effort is normal.  Musculoskeletal:        General: Normal range of motion.     Cervical back: Normal range of motion.  Skin:    General: Skin is warm and dry.  Neurological:     General: No focal deficit present.     Mental Status: He is alert and oriented to person, place, and time.  Psychiatric:        Mood and Affect: Mood normal.        Behavior: Behavior normal.     Imaging: MR ABDOMEN MRCP W WO CONTAST  Result Date: 09/16/2022 CLINICAL DATA:  Jaundice. History of pancreatitis and pancreatic cyst. EXAM: MRI ABDOMEN WITHOUT AND WITH CONTRAST (INCLUDING MRCP) TECHNIQUE: Multiplanar multisequence MR imaging of the abdomen was performed both before and after the administration of intravenous  contrast. Heavily T2-weighted images of the biliary and pancreatic ducts were obtained, and three-dimensional MRCP images were rendered by post processing. CONTRAST:  7mL GADAVIST GADOBUTROL 1 MMOL/ML IV SOLN COMPARISON:  CT 09/14/2022 and older.  MRI 01/02/2022 and older FINDINGS: Lower chest: Mild linear opacity seen in the right lung base likely scar or atelectasis. No pleural effusion. Hepatobiliary: No enhancing space-occupying liver lesion. Patent portal vein. There is diffuse biliary ductal dilatation which is slightly increased from November 2023. The common duct has a diameter proximally approaching 13 mm but tapers abruptly at the pancreatic head. There is no mural enhancement or nodularity. This could be a benign stricture with a history of pancreatitis. Please correlate for any known history. Pancreas: Severe atrophy of the pancreas with ductal dilatation including side-branch dilatation diffusely. There is stricture, narrowing of the pancreatic head along the pancreatic duct where there is complex heterogeneous masslike area which has a mixed signal on precontrast T1 and T2 with some central bright T2 signal. This area measures a proximally 4.4 x 4.5 cm and could be the sequela of previous pancreatitis. Along the posterior aspect of the structure is a rounded area measuring 12 mm in AP diameter which shows to be an area of enhancement comparing pre and postcontrast. 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 The lesion in question overall does cause mass effect along surrounding structures causing narrowing of the portal venous confluence. There is a occlusion of the SMV. Several left upper quadrant collateral vessels are identified. Spleen:  Within normal limits in size and appearance. Adrenals/Urinary Tract: No masses identified. No evidence of hydronephrosis.  Stomach/Bowel: Visualized portions within the abdomen are unremarkable. Vascular/Lymphatic: Circumaortic left renal vein. Atherosclerotic changes along the aorta. Normal caliber aorta and IVC. No specific abnormal lymph node enlargement identified. Other: There is some stranding along the central mesentery as well as upper retroperitoneum. Trace  right upper quadrant ascites. Musculoskeletal: Curvature of the spine with degenerative changes. IMPRESSION: Complex mass, fluid collection once again seen extending caudal to the head and neck of the pancreas. 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 Recommend intervention consultation. Although the lesion very well could be consistent with the history of pancreatitis, with the appearance of the mass lesion, recommend continued surveillance to confirm resolution and exclude secondary pathology. 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 There is also severe dilatation of the pancreatic duct with stricture of the pancreatic head. The lesion also associated with occlusion of the SMV, narrowing of the proximal portal vein with numerous collateral vessels. Trace ascites and mesenteric stranding. Electronically Signed   By: Karen Kays M.D.   On: 09/16/2022 14:18   MR 3D Recon At Scanner  Result Date: 09/16/2022 CLINICAL DATA:  Jaundice. History of pancreatitis and pancreatic cyst. EXAM: MRI ABDOMEN WITHOUT AND WITH CONTRAST (INCLUDING MRCP) TECHNIQUE: Multiplanar multisequence MR imaging of the abdomen was performed both before and after the administration of intravenous contrast. Heavily T2-weighted images of the biliary and pancreatic ducts were obtained, and three-dimensional MRCP images were rendered by post  processing. CONTRAST:  7mL GADAVIST GADOBUTROL 1 MMOL/ML IV SOLN COMPARISON:  CT 09/14/2022 and older.  MRI 01/02/2022 and older FINDINGS: Lower chest: Mild linear opacity seen in the right lung base likely scar or atelectasis. No pleural effusion. Hepatobiliary: No enhancing space-occupying liver lesion. Patent portal vein. There is diffuse biliary ductal dilatation which is slightly increased from November 2023. The common duct has a diameter proximally approaching 13 mm but tapers abruptly at the pancreatic head. There is no mural enhancement or nodularity. This could be a benign stricture with a history of pancreatitis. Please correlate for any known history. Pancreas: Severe atrophy of the pancreas with ductal dilatation including side-branch dilatation diffusely. There is stricture, narrowing of the pancreatic head along the pancreatic duct where there is complex heterogeneous masslike area which has a mixed signal on precontrast T1 and T2 with some central bright T2 signal. This area measures a proximally 4.4 x 4.5 cm and could be the sequela of previous pancreatitis. Along the posterior aspect of the structure is a rounded area measuring 12 mm in AP diameter which shows to be an area of enhancement comparing pre and postcontrast. 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 The lesion in question overall does cause mass effect along surrounding structures causing narrowing of the portal venous confluence. There is a occlusion of the SMV. Several left upper quadrant collateral vessels are identified. Spleen:  Within normal limits in size and appearance. Adrenals/Urinary Tract: No masses identified. No evidence of hydronephrosis. Stomach/Bowel: Visualized portions within the abdomen are unremarkable. Vascular/Lymphatic: Circumaortic left renal vein. Atherosclerotic changes along the  aorta. Normal caliber aorta and IVC. No specific abnormal lymph node enlargement identified. Other: There is some stranding along the central mesentery as well as upper retroperitoneum. Trace right upper quadrant ascites. Musculoskeletal: Curvature of the spine with degenerative changes. IMPRESSION: Complex mass, fluid collection once again seen extending caudal to the head and neck of the pancreas. 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 Recommend intervention consultation. Although the lesion very well could be consistent with the history of pancreatitis, with the appearance of the mass lesion, recommend continued surveillance to confirm resolution and exclude secondary pathology. 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 There is also severe dilatation of the pancreatic duct with stricture of the pancreatic head. The lesion also associated with occlusion of the SMV, narrowing of the proximal portal vein with numerous collateral vessels. Trace ascites and mesenteric stranding. Electronically Signed   By: Karen Kays M.D.   On: 09/16/2022 14:18   ECHOCARDIOGRAM COMPLETE  Result Date: 09/15/2022    ECHOCARDIOGRAM REPORT   Patient Name:   Justin Suarez Date of Exam: 09/15/2022 Medical Rec #:  [id number]        Height:       71.0 in Accession #:    [id number]       Weight:       160.5 lb Date of Birth:  1955/10/29        BSA:          1.920 m Patient Age:    66 years         BP:           159/69 mmHg Patient Gender: M                HR:           61 bpm. Exam Location:  Inpatient Procedure: 2D Echo, Cardiac Doppler and Color Doppler Indications:    Abnormal EKG  History:        Patient has prior history of Echocardiogram examinations, most                  recent 03/22/2011. COPD; Risk Factors:Hypertension, Dyslipidemia,                 Current Smoker and hx DVT, hx PE, alcohol abuse.  Sonographer:    Wallie Char Referring Phys: [id number] [facility]  Sonographer Comments: Image acquisition challenging due to COPD. IMPRESSIONS  1. Left ventricular ejection fraction, by estimation, is 55 to 60%. The left ventricle has normal function. The left ventricle has no regional wall motion abnormalities. There is mild left ventricular hypertrophy. Left ventricular diastolic parameters were normal.  2. Right ventricular systolic function is normal. The right ventricular size is normal.  3. Trivial mitral valve regurgitation.  4. The aortic valve is tricuspid. Aortic valve regurgitation is not visualized. FINDINGS  Left Ventricle: Left ventricular ejection fraction, by estimation, is 55 to 60%. The left ventricle has normal function. The left ventricle has no regional wall motion abnormalities. The left ventricular internal cavity size was normal in size. There is  mild left ventricular hypertrophy. Left ventricular diastolic parameters were normal. Right Ventricle: The right ventricular size is normal. Right vetricular wall thickness was not assessed. Right ventricular systolic function is normal. Left Atrium: Left atrial size was normal in size. Right Atrium: Right atrial size was normal in size. Pericardium: There is no evidence of pericardial effusion. Mitral Valve: Mild mitral annular calcification. Trivial mitral valve regurgitation. MV peak gradient, 4.2 mmHg. The mean mitral valve gradient is 2.0 mmHg. Tricuspid Valve: The tricuspid valve is normal in structure. Tricuspid valve  regurgitation is trivial. Aortic Valve: The aortic valve is tricuspid. Aortic valve regurgitation is not visualized. Aortic valve mean gradient measures 3.0 mmHg. Aortic valve peak gradient measures 6.0 mmHg. Aortic valve area, by VTI measures 3.40 cm. Pulmonic Valve: The pulmonic valve was  not well visualized. Pulmonic valve regurgitation is not visualized. Aorta: The aortic root is normal in size and structure. IAS/Shunts: No atrial level shunt detected by color flow Doppler.  LEFT VENTRICLE PLAX 2D LVIDd:         4.90 cm     Diastology LVIDs:         3.60 cm     LV e' medial:    7.35 cm/s LV PW:         0.90 cm     LV E/e' medial:  11.0 LV IVS:        1.20 cm     LV e' lateral:   9.75 cm/s LVOT diam:     2.30 cm     LV E/e' lateral: 8.3 LV SV:         92 LV SV Index:   48 LVOT Area:     4.15 cm  LV Volumes (MOD) LV vol d, MOD A2C: 72.6 ml LV vol d, MOD A4C: 97.6 ml LV vol s, MOD A2C: 31.4 ml LV vol s, MOD A4C: 40.9 ml LV SV MOD A2C:     41.2 ml LV SV MOD A4C:     97.6 ml LV SV MOD BP:      50.8 ml RIGHT VENTRICLE             IVC RV Basal diam:  3.40 cm     IVC diam: 2.70 cm RV S prime:     12.80 cm/s TAPSE (M-mode): 2.7 cm LEFT ATRIUM             Index        RIGHT ATRIUM           Index LA diam:        3.80 cm 1.98 cm/m   RA Area:     15.70 cm LA Vol (A2C):   45.0 ml 23.43 ml/m  RA Volume:   39.10 ml  20.36 ml/m LA Vol (A4C):   45.5 ml 23.69 ml/m LA Biplane Vol: 46.6 ml 24.27 ml/m  AORTIC VALVE AV Area (Vmax):    3.47 cm AV Area (Vmean):   3.29 cm AV Area (VTI):     3.40 cm AV Vmax:           122.00 cm/s AV Vmean:          85.400 cm/s AV VTI:            0.272 m AV Peak Grad:      6.0 mmHg AV Mean Grad:      3.0 mmHg LVOT Vmax:         102.00 cm/s LVOT Vmean:        67.600 cm/s LVOT VTI:          0.222 m LVOT/AV VTI ratio: 0.82  AORTA Ao Root diam: 3.80 cm Ao Asc diam:  3.60 cm MITRAL VALVE MV Area (PHT): 2.66 cm    SHUNTS MV Area VTI:   3.03 cm    Systemic VTI:  0.22 m MV Peak grad:  4.2 mmHg    Systemic Diam: 2.30 cm MV Mean grad:  2.0 mmHg MV Vmax:       1.02 m/s MV Vmean:  57.1 cm/s MV Decel Time: 285 msec MV E velocity: 81.00 cm/s MV A velocity: 83.30 cm/s MV E/A ratio:  0.97 Dietrich Pates MD Electronically signed by Dietrich Pates MD Signature Date/Time: 09/15/2022/4:52:01 PM    Final     CT ABDOMEN PELVIS W CONTRAST  Result Date: 09/15/2022 CLINICAL DATA:  Right upper quadrant pain, epigastric pain, pancreatitis EXAM: CT ABDOMEN AND PELVIS WITH CONTRAST TECHNIQUE: Multidetector CT imaging of the abdomen and pelvis was performed using the standard protocol following bolus administration of intravenous contrast. RADIATION DOSE REDUCTION: This exam was performed according to the departmental dose-optimization program which includes automated exposure control, adjustment of the mA and/or kV according to patient size and/or use of iterative reconstruction technique. CONTRAST:  OMNIPAQUE IOHEXOL 300 MG/ML  SOLN COMPARISON:  11/18/2020 FINDINGS: Lower chest: No acute abnormality. Hepatobiliary: Prior cholecystectomy. Mild intrahepatic and extrahepatic biliary ductal dilatation. Common bile duct measures up to 13 mm. Findings are stable since prior study. Pancreas: Enlarged masslike appearance of the pancreatic head, similar to prior studies. There is pancreatic ductal dilatation. Fluid/stranding around the pancreatic head and uncinate process. Calcifications scattered throughout the pancreatic body, head and uncinate process. Spleen: No focal abnormality.  Normal size. Adrenals/Urinary Tract: No adrenal abnormality. No focal renal abnormality. No stones or hydronephrosis. Urinary bladder is unremarkable. Stomach/Bowel: Normal appendix. Stomach, large and small bowel grossly unremarkable. Vascular/Lymphatic: Heavily calcified aorta and iliac vessels. No evidence of aneurysm or adenopathy. Reproductive: No visible focal abnormality. Other: No free fluid or free air. Musculoskeletal: No acute bony abnormality. IMPRESSION: Masslike enlargement of the pancreatic head is similar to prior CT and MRI. This may reflect changes of acute pancreatitis, but underlying mass lesion difficult to exclude. Recommend follow-up CT or MRI after acute symptoms resolve. Calcifications in the pancreas compatible with  chronic pancreatitis. Intrahepatic and extrahepatic biliary ductal dilatation are stable since prior study and likely related to post cholecystectomy state Aortic atherosclerosis. Electronically Signed   By: Charlett Nose M.D.   On: 09/15/2022 02:36   DG HIP UNILAT WITH PELVIS 2-3 VIEWS LEFT  Result Date: 09/07/2022 CLINICAL DATA:  Intramedullary nail placement EXAM: DG HIP (WITH OR WITHOUT PELVIS) 3V LEFT COMPARISON:  None Available. FINDINGS: Three fluoroscopic images obtained during intramedullary nail placement. 24 seconds fluoro time utilized. Radiation dose 4.4398 mGy Kerma. Please see performing physicians operative report for full details IMPRESSION: Fluoroscopic images were obtained for intraoperative guidance of left femoral intramedullary nail placement. Electronically Signed   By: Agustin Cree M.D.   On: 09/07/2022 15:24   DG C-Arm 1-60 Min-No Report  Result Date: 09/07/2022 Fluoroscopy was utilized by the requesting physician.  No radiographic interpretation.   CT Head Wo Contrast  Result Date: 09/06/2022 CLINICAL DATA:  Fall in an Engineer, structural. Intoxication. Minor head trauma. EXAM: CT HEAD WITHOUT CONTRAST TECHNIQUE: Contiguous axial images were obtained from the base of the skull through the vertex without intravenous contrast. RADIATION DOSE REDUCTION: This exam was performed according to the departmental dose-optimization program which includes automated exposure control, adjustment of the mA and/or kV according to patient size and/or use of iterative reconstruction technique. COMPARISON:  None Available. FINDINGS: Brain: No evidence of acute infarction, hemorrhage, hydrocephalus, extra-axial collection or mass lesion/mass effect. Dilated perivascular space below the right putamen. Vascular: No hyperdense vessel or unexpected calcification. Skull: Normal. Negative for fracture or focal lesion. Sinuses/Orbits: No evidence of injury IMPRESSION: No evidence of intracranial injury. Electronically  Signed   By: Tiburcio Pea M.D.   On: 09/06/2022  05:13   DG Hip Unilat W or Wo Pelvis 2-3 Views Left  Result Date: 09/06/2022 CLINICAL DATA:  Fall, left hip pain EXAM: DG HIP (WITH OR WITHOUT PELVIS) 2-3V LEFT COMPARISON:  None Available. FINDINGS: There is an acute, minimally displaced, anatomically aligned intratrochanteric fracture of the left hip with fracture fragments in near anatomic alignment. Left femoral head is still seated within the left acetabulum. Pelvis and visualized proximal right femur are intact. Mild bilateral degenerative hip arthritis. Vascular calcifications noted. IMPRESSION: 1. Acute, minimally displaced, anatomically aligned intratrochanteric fracture of the left hip. Electronically Signed   By: Helyn Numbers M.D.   On: 09/06/2022 01:30   DG Chest Portable 1 View  Result Date: 09/06/2022 CLINICAL DATA:  Fall, hip fracture, preoperative clearance for surgery. EXAM: PORTABLE CHEST 1 VIEW COMPARISON:  11/18/2020 FINDINGS: The heart size and mediastinal contours are within normal limits. Both lungs are clear. The visualized skeletal structures are unremarkable. IMPRESSION: No active disease. Electronically Signed   By: Helyn Numbers M.D.   On: 09/06/2022 01:28    Labs:  CBC: Recent Labs    09/15/22 0609 09/16/22 0459 09/17/22 0416 09/18/22 0337  WBC 7.4 5.1 5.0 4.6  HGB 10.2* 9.8* 9.6* 9.8*  HCT 30.9* 30.1* 28.8* 29.8*  PLT 230 198 191 219    COAGS: Recent Labs    10/11/21 0900 09/06/22 0210 09/07/22 0400  INR 0.9 1.0 1.1    BMP: Recent Labs    09/15/22 0609 09/16/22 0459 09/17/22 0416 09/18/22 0337  NA 132* 132* 129* 134*  K 4.4 3.8 4.0 3.2*  CL 98 99 95* 100  CO2 21* 25 26 26   GLUCOSE 124* 95 109* 108*  BUN 9 7* 8 7*  CALCIUM 8.2* 8.4* 8.3* 8.3*  CREATININE 0.48* 0.63 0.71 0.66  GFRNONAA >60 >60 >60 >60    LIVER FUNCTION TESTS: Recent Labs    09/15/22 0609 09/16/22 0459 09/17/22 0416 09/18/22 0337  BILITOT 1.0 3.1* 2.1* 1.0  AST  117* 306* 137* 53*  ALT 90* 218* 155* 108*  ALKPHOS 248* 346* 342* 302*  PROT 5.8* 5.9* 5.9* 6.1*  ALBUMIN 2.9* 2.9* 2.9* 2.8*      Assessment and Plan:  67 y.o. male inpatient. History of alcohol abuse, pancreatitis, HTN. Recent left hip surgery. DVT/PE on xarelto. Presented to the ED at [facility] on 7.26.24 with epigastric pain, nausea and vomiting. MR from 7.27.24 reads Complex mass, fluid collection once again seen extending caudal to the head and neck of the pancreas. 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 Recommend intervention consultation. Team is requesting evaluation for possible pancreatic aneurysm embolization. 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  Last dose of xarelto given on 7.28.24 @ 09:49. AST 53, ALT 108, total protein 6.1, potassium 3.2, BUN < 7. Patient not NPO at this time. NKDA.   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 Case has been reviewed and procedure approved by Dr. Ruel Favors.  Patient tentatively scheduled for 7.30.24.  Team instructed to: Keep Patient to be NPO after midnight Continue to hold xarelto  Should patient condition change and the procedure becomes emergent please advise IR  IR will call patient when ready.  The Risks and benefits of embolization were discussed  with the patient including, but not limited to bleeding, infection, vascular injury, post operative pain, or contrast induced renal failure.  This procedure involves the use of X-rays and because of the nature of the planned procedure, it is possible that we will have prolonged use of X-ray fluoroscopy.  Potential radiation risks to you include (but are not limited to) the following: - A slightly  elevated risk for cancer several years later in life. This risk is typically less than 0.5% percent. This risk is low in comparison to the normal incidence of human cancer, which is 33% for women and 50% for men according to the [employer]. - Radiation induced injury can include skin redness, resembling a rash, tissue breakdown / ulcers and hair loss (which can be temporary or permanent).   The likelihood of either of these occurring depends on the difficulty of the procedure and whether you are sensitive to radiation due to previous procedures, disease, or genetic conditions.   IF your procedure requires a prolonged use of radiation, you will be notified and given written instructions for further action.  It is your responsibility to monitor the irradiated area for the 2 weeks following the procedure and to notify your physician if you are concerned that you have suffered a radiation induced injury.    All of the patient's questions were answered, patient is agreeable to proceed. Consent signed and in chart.    Thank you for this interesting consult.  I greatly enjoyed meeting Justin Suarez and look forward to participating in their care.  A copy of this report was sent to the requesting provider on this date.  Electronically Signed: Alene Mires, NP 09/18/2022, 3:25 PM   I spent a total of 40 Minutes    in face to face in clinical consultation, greater than 50% of which was counseling/coordinating care for pancreatic pseudoaneurysm embolization.

## 2022-09-18 NOTE — Plan of Care (Signed)
  Problem: Activity: Goal: Ability to avoid complications of mobility impairment will improve Outcome: Progressing Goal: Ability to tolerate increased activity will improve Outcome: Progressing   Problem: Clinical Measurements: Goal: Postoperative complications will be avoided or minimized Outcome: Progressing   Problem: Clinical Measurements: Goal: Will remain free from infection Outcome: Progressing   Problem: Pain Management: Goal: Pain level will decrease with appropriate interventions Outcome: Not Progressing

## 2022-09-18 NOTE — Progress Notes (Signed)
Mobility Specialist - Progress Note   09/18/22 1050  Mobility  Activity Ambulated with assistance in hallway  Level of Assistance Modified independent, requires aide device or extra time  Assistive Device Crutches  Distance Ambulated (ft) 350 ft  Range of Motion/Exercises Active  LLE Weight Bearing PWB  Activity Response Tolerated well  Mobility Referral Yes  $Mobility charge 1 Mobility  Mobility Specialist Start Time (ACUTE ONLY) 1039  Mobility Specialist Stop Time (ACUTE ONLY) 1048  Mobility Specialist Time Calculation (min) (ACUTE ONLY) 9 min   Pt was found in bed and agreeable to ambulate. No complaints with session and at EOS returned to recliner chair with needs met. Call bell in reach.  Billey Chang Mobility Specialist

## 2022-09-18 NOTE — Progress Notes (Signed)
IR Procedure request. 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 Male inpatient. History of alcohol abuse, pancreatitis, HTN. Recent left hip surgery. DVT/PE on xarelto. Presented to the ED at [facility] on 7.26.24 with epigastric pain, nausea and vomiting. MR from 7.27.24 reads Complex mass, fluid collection once again seen extending caudal to the head and neck of the pancreas. 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 Recommend intervention consultation. Last dose of xarelto given on 7.28.24 @ 09:49. 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 Ruel Favors who recommends a CTA AP with venous phase for further evaluation. This was ordered and communicated to the Team. Team also made aware that should IR proceed with this procedure the Patient may need to be transferred to [facility] due to their larger supply of stock.  IR Procedure TBD.

## 2022-09-19 DIAGNOSIS — R1013 Epigastric pain: Secondary | ICD-10-CM | POA: Diagnosis not present

## 2022-09-19 DIAGNOSIS — K859 Acute pancreatitis without necrosis or infection, unspecified: Secondary | ICD-10-CM | POA: Diagnosis not present

## 2022-09-19 DIAGNOSIS — K852 Alcohol induced acute pancreatitis without necrosis or infection: Secondary | ICD-10-CM | POA: Diagnosis not present

## 2022-09-19 DIAGNOSIS — Z7901 Long term (current) use of anticoagulants: Secondary | ICD-10-CM | POA: Diagnosis not present

## 2022-09-19 DIAGNOSIS — R112 Nausea with vomiting, unspecified: Secondary | ICD-10-CM | POA: Diagnosis not present

## 2022-09-19 LAB — HEMOGLOBIN AND HEMATOCRIT, BLOOD
HCT: 31.6 % — ABNORMAL LOW (ref 39.0–52.0)
HCT: 30.8 % — ABNORMAL LOW (ref 39.0–52.0)
HCT: 29.2 % — ABNORMAL LOW (ref 39.0–52.0)
Hemoglobin: 10.4 g/dL — ABNORMAL LOW (ref 13.0–17.0)
Hemoglobin: 9.8 g/dL — ABNORMAL LOW (ref 13.0–17.0)
Hemoglobin: 9.6 g/dL — ABNORMAL LOW (ref 13.0–17.0)

## 2022-09-19 LAB — COMPREHENSIVE METABOLIC PANEL
ALT: 76 U/L — ABNORMAL HIGH (ref 0–44)
AST: 30 U/L (ref 15–41)
Albumin: 2.9 g/dL — ABNORMAL LOW (ref 3.5–5.0)
Alkaline Phosphatase: 269 U/L — ABNORMAL HIGH (ref 38–126)
Anion gap: 8 (ref 5–15)
BUN: 6 mg/dL — ABNORMAL LOW (ref 8–23)
CO2: 26 mmol/L (ref 22–32)
Calcium: 8.6 mg/dL — ABNORMAL LOW (ref 8.9–10.3)
Chloride: 97 mmol/L — ABNORMAL LOW (ref 98–111)
Creatinine, Ser: 0.7 mg/dL (ref 0.61–1.24)
GFR, Estimated: >60 mL/min (ref >60)
Glucose, Bld: 113 mg/dL — ABNORMAL HIGH (ref 70–99)
Potassium: 4.3 mmol/L (ref 3.5–5.1)
Sodium: 131 mmol/L — ABNORMAL LOW (ref 135–145)
Total Bilirubin: 0.8 mg/dL (ref 0.3–1.2)
Total Protein: 6.4 g/dL — ABNORMAL LOW (ref 6.5–8.1)

## 2022-09-19 LAB — LIPASE, BLOOD: Lipase: 40 U/L (ref 11–51)

## 2022-09-19 NOTE — Progress Notes (Signed)
Referring Physician(s): Mike Gip, [place]  Supervising Physician: Richarda Overlie  Patient Status:  [facility] - In-pt  Chief Complaint:  S/p pancreatic pseudoaneurysm embolization  Subjective:  Patient cheerful, sitting upright in bed at time of exam. He reports no discomfort at his right groin access site and endorses one mild episode of abdominal pain this AM that did not require medication and no pain since then.  Allergies: Patient has no known allergies.  Medications: Prior to Admission medications   Medication Sig Start Date End Date Taking? Authorizing Provider  methocarbamol (ROBAXIN) 500 MG tablet Take 1 tablet (500 mg total) by mouth every 6 (six) hours as needed for muscle spasms. 09/08/22  Yes Cassandria Anger, PA-C  metoprolol tartrate (LOPRESSOR) 25 MG tablet Take 0.5 tablets (12.5 mg total) by mouth 2 (two) times daily. 09/08/22  Yes Rhetta Mura, MD  oxyCODONE (OXY IR/ROXICODONE) 5 MG immediate release tablet Take 1 tablet (5 mg total) by mouth every 4 (four) hours as needed for severe pain. 09/08/22  Yes Rosalene Billings R, PA-C  polyethylene glycol (MIRALAX / GLYCOLAX) 17 g packet Take 17 g by mouth 2 (two) times daily. 09/08/22  Yes Cassandria Anger, PA-C  rivaroxaban (XARELTO) 10 MG TABS tablet Take 1 tablet (10 mg total) by mouth daily with breakfast for 14 days. After completing, take aspirin 81 mg twice daily for 1 month 09/08/22 09/22/22 Yes Cassandria Anger, PA-C  senna-docusate (SENOKOT-S) 8.6-50 MG tablet Take 2 tablets by mouth at bedtime as needed for up to 14 days for mild constipation. 09/08/22 09/22/22 Yes Cassandria Anger, PA-C     Vital Signs: BP 138/72 (BP Location: Left Arm)   Pulse 61   Temp 97.7 F (36.5 C) (Oral)   Resp 16   Ht 5\' 11"  (1.803 m)   Wt 157 lb 14.4 oz (71.6 kg)   SpO2 100%   BMI 22.02 kg/m   Physical Exam Vitals reviewed.  Constitutional:      General: He is not in acute distress. Pulmonary:     Effort: Pulmonary effort is  normal.  Abdominal:     Tenderness: There is no abdominal tenderness.  Musculoskeletal:     Comments: Right groin puncture site soft, non-tender. No concern for hematoma, bleeding, or pseudoaneurysm at this time.  Skin:    General: Skin is warm and dry.  Neurological:     Mental Status: He is alert and oriented to person, place, and time.     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 EXAM: 1. Ultrasound-guided vascular access of the right common femoral artery. 2. Pelvic angiogram 3. Selective catheterization and angiography of the superior mesenteric, dorsal pancreatic, posterior pancreaticoduodenal arcade, celiac, gastroduodenal, and superior pancreaticoduodenal arcade 4. Coil embolization of superior pancreaticoduodenal arcade MEDICATIONS: None. ANESTHESIA/SEDATION: Moderate (conscious) sedation was employed during this procedure. A total of Versed 4 mg and Fentanyl 150 mcg was administered intravenously. Moderate Sedation Time: 109 minutes. The patient's level of consciousness and vital signs were monitored continuously by radiology nursing throughout the procedure under my direct supervision. CONTRAST:  OMNIPAQUE IOHEXOL 300 MG/ML SOLN, 54mL OMNIPAQUE IOHEXOL 300 MG/ML SOLN, 54mL OMNIPAQUE IOHEXOL 300 MG/ML SOLN FLUOROSCOPY: Radiation Exposure Index (as provided by the fluoroscopic device): 2,180 mGy Kerma COMPLICATIONS: None immediate. PROCEDURE: Informed consent was obtained from the patient following explanation of the procedure, risks, benefits and alternatives. The patient  understands, agrees and consents for the procedure. All questions were addressed. A time out was performed prior to the initiation of the procedure. Maximal barrier sterile technique utilized including caps, mask, sterile gowns, sterile gloves, large sterile  drape, hand hygiene, and Betadine prep. Preprocedure ultrasound evaluation demonstrated patency of the right common femoral artery. The procedure was planned. Subdermal Local anesthesia was provided with 1% lidocaine. A small skin nick was made. Under direct ultrasound visualization, a 21 gauge micropuncture needle was directed into the common femoral artery. A ultrasound image was captured and saved in the permanent record. A micropuncture sheath was inserted and limited right lower extremity angiogram was performed which demonstrated adequate puncture site for closure device use. There was difficulty advancing an 035 J wire and a micropuncture sheath into iliac arteries. 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 Using a Glidewire, the catheter was able to be directed to the right common iliac artery. Angiogram demonstrated severe focal stenosis in the mid right common iliac artery. Wire was able to be navigated into the distal abdominal aorta and the catheter was exchanged for an Omni flush catheter. Dedicated pelvic angiogram was performed. The left common, external, and internal iliac arteries are widely patent. 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 The internal iliac artery is patent. The external iliac artery again demonstrates focal moderate stenosis in its mid to distal segment. The common femoral arteries are patent bilaterally. Guidewire was inserted and the Omni Flush catheter was exchanged for a C2 catheter. Under fluoroscopic visualization, the superior mesenteric artery was selected. Dedicated superior mesenteric angiogram demonstrated patency of the superior mesenteric artery with a proximal branch identified is the dorsal  pancreatic artery. There is no evidence of active extravasation. Of note, there is a replaced right hepatic artery arising proximal superior mesenteric artery. A 2.4 French Progreat microcatheter and fathom 14 microwire were used to select the dorsal pancreatic artery. Dedicated angiogram was performed which demonstrated no evidence of active extravasation. The pancreaticoduodenal arcade was then navigated in the catheter tip was positioned in the gastroduodenal artery via retrograde approach. Angiogram from the gastroduodenal artery demonstrated primarily opacification of the right gastroepiploic artery with no evidence of active extravasation. The microcatheter was removed. The 5 French C2 catheter was then redirected superiorly to the celiac trunk. Celiac angiogram demonstrated widely patent a left hepatic artery and splenic artery. The gastroduodenal artery is patent. Glidewire was then advanced to the gastroduodenal artery and dedicated angiogram was performed. There is no evidence of active extravasation in the region of the pancreatic head. The coaxial microcatheter system was then used to select the superior pancreaticoduodenal arcade. Angiogram was repeated which demonstrated no evidence of active extravasation and its branches. Careful study correlating this location with recent comparison CTA was done and it was deemed that this region was most likely responsible for the previously visualized hemorrhage. Therefore, prophylactic coil embolization was performed of the superior pancreaticoduodenal arcade with several 1-2 mm detachable low-profile Ruby coils. Completion angiogram was performed from the distal gastroduodenal artery which demonstrated successful embolization of the superior pancreaticoduodenal arcade. There is preserved patency of the right gastroepiploic artery. The catheters were removed. The right groin access site was closed with a 6 French Angio-Seal device with ultrasound assistance  without evidence of complication. Distal  pulses were unchanged. The patient tolerated the procedure well was transferred back to the floor in good condition. IMPRESSION: 1. No evidence of active extravasation visualized after extensive angiographic evaluation of the pancreaticoduodenal arteries. 2. Prophylactic coil embolization of the superior pancreaticoduodenal arcade. Marliss Coots, MD [facility] Radiology Electronically Signed   By: Marliss Coots M.D.   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 EXAM: 1. Ultrasound-guided vascular access of the right common femoral artery. 2. Pelvic angiogram 3. Selective catheterization and angiography of the superior mesenteric, dorsal pancreatic, posterior pancreaticoduodenal arcade, celiac, gastroduodenal, and superior pancreaticoduodenal arcade 4. Coil embolization of superior pancreaticoduodenal arcade MEDICATIONS: None. ANESTHESIA/SEDATION: Moderate (conscious) sedation was employed during this procedure. A total of Versed 4 mg and Fentanyl 150 mcg was administered intravenously. Moderate Sedation Time: 109 minutes. The patient's level of consciousness and vital signs were monitored continuously by radiology nursing throughout the procedure under my direct supervision. CONTRAST:  OMNIPAQUE IOHEXOL 300 MG/ML SOLN, 54mL OMNIPAQUE IOHEXOL 300 MG/ML SOLN, 54mL OMNIPAQUE IOHEXOL 300 MG/ML SOLN FLUOROSCOPY: Radiation Exposure Index (as provided by the fluoroscopic device): 2,180 mGy Kerma COMPLICATIONS: None immediate. PROCEDURE: Informed consent was obtained from the patient following explanation of the procedure, risks, benefits and alternatives. The patient understands, agrees and consents for the procedure. All questions were addressed. A time  out was performed prior to the initiation of the procedure. Maximal barrier sterile technique utilized including caps, mask, sterile gowns, sterile gloves, large sterile drape, hand hygiene, and Betadine prep. Preprocedure ultrasound evaluation demonstrated patency of the right common femoral artery. The procedure was planned. Subdermal Local anesthesia was provided with 1% lidocaine. A small skin nick was made. Under direct ultrasound visualization, a 21 gauge micropuncture needle was directed into the common femoral artery. A ultrasound image was captured and saved in the permanent record. A micropuncture sheath was inserted and limited right lower extremity angiogram was performed which demonstrated adequate puncture site for closure device use. There was difficulty advancing an 035 J wire and a micropuncture sheath into iliac arteries. 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 Using a Glidewire, the catheter was able to be directed to the right common iliac artery. Angiogram demonstrated severe focal stenosis in the mid right common iliac artery. Wire was able to be navigated into the distal abdominal aorta and the catheter was exchanged for an Omni flush catheter. Dedicated pelvic angiogram was performed. The left common, external, and internal iliac arteries are widely patent. 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 The internal iliac artery is patent. The external iliac artery again demonstrates focal moderate stenosis in its mid to distal segment. The common femoral arteries are patent bilaterally. Guidewire was inserted and the Omni Flush catheter was exchanged for a C2 catheter. Under fluoroscopic visualization, the superior  mesenteric artery was selected. Dedicated superior mesenteric angiogram demonstrated patency of the superior mesenteric artery with a proximal branch identified is the dorsal pancreatic artery. There is no evidence of active extravasation. Of note, there is a replaced right hepatic artery arising proximal superior mesenteric artery. A 2.4 French Progreat microcatheter and fathom 14 microwire were  used to select the dorsal pancreatic artery. Dedicated angiogram was performed which demonstrated no evidence of active extravasation. The pancreaticoduodenal arcade was then navigated in the catheter tip was positioned in the gastroduodenal artery via retrograde approach. Angiogram from the gastroduodenal artery demonstrated primarily opacification of the right gastroepiploic artery with no evidence of active extravasation. The microcatheter was removed. The 5 French C2 catheter was then redirected superiorly to the celiac trunk. Celiac angiogram demonstrated widely patent a left hepatic artery and splenic artery. The gastroduodenal artery is patent. Glidewire was then advanced to the gastroduodenal artery and dedicated angiogram was performed. There is no evidence of active extravasation in the region of the pancreatic head. The coaxial microcatheter system was then used to select the superior pancreaticoduodenal arcade. Angiogram was repeated which demonstrated no evidence of active extravasation and its branches. Careful study correlating this location with recent comparison CTA was done and it was deemed that this region was most likely responsible for the previously visualized hemorrhage. Therefore, prophylactic coil embolization was performed of the superior pancreaticoduodenal arcade with several 1-2 mm detachable low-profile Ruby coils. Completion angiogram was performed from the distal gastroduodenal artery which demonstrated successful embolization of the superior pancreaticoduodenal arcade. There is preserved  patency of the right gastroepiploic artery. The catheters were removed. The right groin access site was closed with a 6 French Angio-Seal device with ultrasound assistance without evidence of complication. Distal pulses were unchanged. The patient tolerated the procedure well was transferred back to the floor in good condition. IMPRESSION: 1. No evidence of active extravasation visualized after extensive angiographic evaluation of the pancreaticoduodenal arteries. 2. Prophylactic coil embolization of the superior pancreaticoduodenal arcade. Marliss Coots, MD [facility] Radiology Electronically Signed   By: Marliss Coots M.D.   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 EXAM: 1. Ultrasound-guided vascular access of the right common femoral artery. 2. Pelvic angiogram 3. Selective catheterization and angiography of the superior mesenteric, dorsal pancreatic, posterior pancreaticoduodenal arcade, celiac, gastroduodenal, and superior pancreaticoduodenal arcade 4. Coil embolization of superior pancreaticoduodenal arcade MEDICATIONS: None. ANESTHESIA/SEDATION: Moderate (conscious) sedation was employed during this procedure. A total of Versed 4 mg and Fentanyl 150 mcg was administered intravenously. Moderate Sedation Time: 109 minutes. The patient's level of consciousness and vital signs were monitored continuously by radiology nursing throughout the procedure under my direct supervision. CONTRAST:  OMNIPAQUE IOHEXOL 300 MG/ML SOLN, 54mL OMNIPAQUE IOHEXOL 300 MG/ML SOLN, 54mL OMNIPAQUE IOHEXOL 300 MG/ML SOLN FLUOROSCOPY: Radiation Exposure Index (as provided by the fluoroscopic device): 2,180 mGy Kerma COMPLICATIONS: None immediate. PROCEDURE: Informed consent was obtained from  the patient following explanation of the procedure, risks, benefits and alternatives. The patient understands, agrees and consents for the procedure. All questions were addressed. A time out was performed prior to the initiation of the procedure. Maximal barrier sterile technique utilized including caps, mask, sterile gowns, sterile gloves, large sterile drape, hand hygiene, and Betadine prep. Preprocedure ultrasound evaluation demonstrated patency of the right common femoral artery. The procedure was planned. Subdermal Local anesthesia was provided with 1% lidocaine. A small skin nick was made. Under direct ultrasound visualization, a 21 gauge micropuncture needle was directed into the common femoral artery. A ultrasound image was captured and saved in the permanent record. A micropuncture sheath was inserted and limited right lower extremity angiogram was performed which demonstrated adequate  puncture site for closure device use. There was difficulty advancing an 035 J wire and a micropuncture sheath into iliac arteries. 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 Using a Glidewire, the catheter was able to be directed to the right common iliac artery. Angiogram demonstrated severe focal stenosis in the mid right common iliac artery. Wire was able to be navigated into the distal abdominal aorta and the catheter was exchanged for an Omni flush catheter. Dedicated pelvic angiogram was performed. The left common, external, and internal iliac arteries are widely patent. 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 The internal iliac artery is patent. The external iliac artery again demonstrates focal moderate stenosis in its mid to distal segment. The  common femoral arteries are patent bilaterally. Guidewire was inserted and the Omni Flush catheter was exchanged for a C2 catheter. Under fluoroscopic visualization, the superior mesenteric artery was selected. Dedicated superior mesenteric angiogram demonstrated patency of the superior mesenteric artery with a proximal branch identified is the dorsal pancreatic artery. There is no evidence of active extravasation. Of note, there is a replaced right hepatic artery arising proximal superior mesenteric artery. A 2.4 French Progreat microcatheter and fathom 14 microwire were used to select the dorsal pancreatic artery. Dedicated angiogram was performed which demonstrated no evidence of active extravasation. The pancreaticoduodenal arcade was then navigated in the catheter tip was positioned in the gastroduodenal artery via retrograde approach. Angiogram from the gastroduodenal artery demonstrated primarily opacification of the right gastroepiploic artery with no evidence of active extravasation. The microcatheter was removed. The 5 French C2 catheter was then redirected superiorly to the celiac trunk. Celiac angiogram demonstrated widely patent a left hepatic artery and splenic artery. The gastroduodenal artery is patent. Glidewire was then advanced to the gastroduodenal artery and dedicated angiogram was performed. There is no evidence of active extravasation in the region of the pancreatic head. The coaxial microcatheter system was then used to select the superior pancreaticoduodenal arcade. Angiogram was repeated which demonstrated no evidence of active extravasation and its branches. Careful study correlating this location with recent comparison CTA was done and it was deemed that this region was most likely responsible for the previously visualized hemorrhage. Therefore, prophylactic coil embolization was performed of the superior pancreaticoduodenal arcade with several 1-2 mm detachable low-profile Ruby coils.  Completion angiogram was performed from the distal gastroduodenal artery which demonstrated successful embolization of the superior pancreaticoduodenal arcade. There is preserved patency of the right gastroepiploic artery. The catheters were removed. The right groin access site was closed with a 6 French Angio-Seal device with ultrasound assistance without evidence of complication. Distal pulses were unchanged. The patient tolerated the procedure well was transferred back to the floor in good condition. IMPRESSION: 1. No evidence of active extravasation visualized after extensive angiographic evaluation of the pancreaticoduodenal arteries. 2. Prophylactic coil embolization of the superior pancreaticoduodenal arcade. Marliss Coots, MD [facility] Radiology Electronically Signed   By: Marliss Coots M.D.   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 EXAM: 1. Ultrasound-guided vascular access of the right common femoral artery. 2. Pelvic angiogram 3. Selective catheterization and angiography of the superior  mesenteric, dorsal pancreatic, posterior pancreaticoduodenal arcade, celiac, gastroduodenal, and superior pancreaticoduodenal arcade 4. Coil embolization of superior pancreaticoduodenal arcade MEDICATIONS: None. ANESTHESIA/SEDATION: Moderate (conscious) sedation was employed during this procedure. A total of Versed 4 mg and Fentanyl 150 mcg was administered intravenously. Moderate Sedation Time: 109 minutes. The patient's level of consciousness and vital signs were monitored continuously by radiology nursing throughout the procedure under my direct supervision. CONTRAST:  OMNIPAQUE IOHEXOL 300 MG/ML SOLN, 54mL OMNIPAQUE IOHEXOL 300 MG/ML SOLN, 54mL OMNIPAQUE IOHEXOL 300 MG/ML SOLN  FLUOROSCOPY: Radiation Exposure Index (as provided by the fluoroscopic device): 2,180 mGy Kerma COMPLICATIONS: None immediate. PROCEDURE: Informed consent was obtained from the patient following explanation of the procedure, risks, benefits and alternatives. The patient understands, agrees and consents for the procedure. All questions were addressed. A time out was performed prior to the initiation of the procedure. Maximal barrier sterile technique utilized including caps, mask, sterile gowns, sterile gloves, large sterile drape, hand hygiene, and Betadine prep. Preprocedure ultrasound evaluation demonstrated patency of the right common femoral artery. The procedure was planned. Subdermal Local anesthesia was provided with 1% lidocaine. A small skin nick was made. Under direct ultrasound visualization, a 21 gauge micropuncture needle was directed into the common femoral artery. A ultrasound image was captured and saved in the permanent record. A micropuncture sheath was inserted and limited right lower extremity angiogram was performed which demonstrated adequate puncture site for closure device use. There was difficulty advancing an 035 J wire and a micropuncture sheath into iliac arteries. 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 Using a Glidewire, the catheter was able to be directed to the right common iliac artery. Angiogram demonstrated severe focal stenosis in the mid right common iliac artery. Wire was able to be navigated into the distal abdominal aorta and the catheter was exchanged for an Omni flush catheter. Dedicated pelvic angiogram was performed. The left common, external, and internal iliac arteries are widely patent. There is severe focal stenosis about the proximal right common iliac artery  secondary to atherosclerotic plaque. The internal iliac artery is patent. The external iliac artery again demonstrates focal moderate stenosis in its mid to distal segment. The common femoral arteries are patent bilaterally. Guidewire was inserted and the Omni Flush catheter was exchanged for a C2 catheter. Under fluoroscopic visualization, the superior mesenteric artery was selected. Dedicated superior mesenteric angiogram demonstrated patency of the superior mesenteric artery with a proximal branch identified is the dorsal pancreatic artery. There is no evidence of active extravasation. Of note, there is a replaced right hepatic artery arising proximal superior mesenteric artery. A 2.4 French Progreat microcatheter and fathom 14 microwire were used to select the dorsal pancreatic artery. Dedicated angiogram was performed which demonstrated no evidence of active extravasation. The pancreaticoduodenal arcade was then navigated in the catheter tip was positioned in the gastroduodenal artery via retrograde approach. Angiogram from the gastroduodenal artery demonstrated primarily opacification of the right gastroepiploic artery with no evidence of active extravasation. The microcatheter was removed. The 5 French C2 catheter was then redirected superiorly to the celiac trunk. Celiac angiogram demonstrated widely patent a left hepatic artery and splenic artery. The gastroduodenal artery is patent. Glidewire was then advanced to the gastroduodenal artery and dedicated angiogram was performed. There is no evidence of active extravasation in the region of the pancreatic head. The  coaxial microcatheter system was then used to select the superior pancreaticoduodenal arcade. Angiogram was repeated which demonstrated no evidence of active extravasation and its branches. Careful study correlating this location with recent comparison CTA was done and it was deemed that this region was most likely responsible for the previously  visualized hemorrhage. Therefore, prophylactic coil embolization was performed of the superior pancreaticoduodenal arcade with several 1-2 mm detachable low-profile Ruby coils. Completion angiogram was performed from the distal gastroduodenal artery which demonstrated successful embolization of the superior pancreaticoduodenal arcade. There is preserved patency of the right gastroepiploic artery. The catheters were removed. The right groin access site was closed with a 6 French Angio-Seal device with ultrasound assistance without evidence of complication. Distal pulses were unchanged. The patient tolerated the procedure well was transferred back to the floor in good condition. IMPRESSION: 1. No evidence of active extravasation visualized after extensive angiographic evaluation of the pancreaticoduodenal arteries. 2. Prophylactic coil embolization of the superior pancreaticoduodenal arcade. Marliss Coots, MD [facility] Radiology Electronically Signed   By: Marliss Coots M.D.   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 EXAM: 1. Ultrasound-guided vascular access of the right common femoral artery. 2. Pelvic angiogram 3. Selective catheterization and angiography of the superior mesenteric, dorsal pancreatic, posterior pancreaticoduodenal arcade, celiac, gastroduodenal, and superior pancreaticoduodenal arcade 4. Coil embolization of superior pancreaticoduodenal arcade MEDICATIONS: None. ANESTHESIA/SEDATION: Moderate (conscious) sedation was employed during this procedure. A total of Versed 4 mg and Fentanyl 150 mcg was administered intravenously. Moderate Sedation Time: 109 minutes. The patient's level of consciousness and vital signs were monitored continuously by radiology  nursing throughout the procedure under my direct supervision. CONTRAST:  OMNIPAQUE IOHEXOL 300 MG/ML SOLN, 54mL OMNIPAQUE IOHEXOL 300 MG/ML SOLN, 54mL OMNIPAQUE IOHEXOL 300 MG/ML SOLN FLUOROSCOPY: Radiation Exposure Index (as provided by the fluoroscopic device): 2,180 mGy Kerma COMPLICATIONS: None immediate. PROCEDURE: Informed consent was obtained from the patient following explanation of the procedure, risks, benefits and alternatives. The patient understands, agrees and consents for the procedure. All questions were addressed. A time out was performed prior to the initiation of the procedure. Maximal barrier sterile technique utilized including caps, mask, sterile gowns, sterile gloves, large sterile drape, hand hygiene, and Betadine prep. Preprocedure ultrasound evaluation demonstrated patency of the right common femoral artery. The procedure was planned. Subdermal Local anesthesia was provided with 1% lidocaine. A small skin nick was made. Under direct ultrasound visualization, a 21 gauge micropuncture needle was directed into the common femoral artery. A ultrasound image was captured and saved in the permanent record. A micropuncture sheath was inserted and limited right lower extremity angiogram was performed which demonstrated adequate puncture site for closure device use. There was difficulty advancing an 035 J wire and a micropuncture sheath into iliac arteries. 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 Using a Glidewire, the catheter was able to be directed to the right common iliac artery. Angiogram demonstrated severe focal stenosis in the mid right common iliac artery. Wire was able to be navigated into the distal abdominal aorta and the catheter was exchanged for an Omni flush catheter.  Dedicated pelvic angiogram was performed. The left common, external, and internal  iliac arteries are widely patent. 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 The internal iliac artery is patent. The external iliac artery again demonstrates focal moderate stenosis in its mid to distal segment. The common femoral arteries are patent bilaterally. Guidewire was inserted and the Omni Flush catheter was exchanged for a C2 catheter. Under fluoroscopic visualization, the superior mesenteric artery was selected. Dedicated superior mesenteric angiogram demonstrated patency of the superior mesenteric artery with a proximal branch identified is the dorsal pancreatic artery. There is no evidence of active extravasation. Of note, there is a replaced right hepatic artery arising proximal superior mesenteric artery. A 2.4 French Progreat microcatheter and fathom 14 microwire were used to select the dorsal pancreatic artery. Dedicated angiogram was performed which demonstrated no evidence of active extravasation. The pancreaticoduodenal arcade was then navigated in the catheter tip was positioned in the gastroduodenal artery via retrograde approach. Angiogram from the gastroduodenal artery demonstrated primarily opacification of the right gastroepiploic artery with no evidence of active extravasation. The microcatheter was removed. The 5 French C2 catheter was then redirected superiorly to the celiac trunk. Celiac angiogram demonstrated widely patent a left hepatic artery and splenic artery. The gastroduodenal artery is patent. Glidewire was then advanced to the gastroduodenal artery and dedicated angiogram was performed. There is no evidence of active extravasation in the region of the pancreatic head. The coaxial microcatheter system was then used to select the superior pancreaticoduodenal arcade. Angiogram was repeated which demonstrated no evidence of active  extravasation and its branches. Careful study correlating this location with recent comparison CTA was done and it was deemed that this region was most likely responsible for the previously visualized hemorrhage. Therefore, prophylactic coil embolization was performed of the superior pancreaticoduodenal arcade with several 1-2 mm detachable low-profile Ruby coils. Completion angiogram was performed from the distal gastroduodenal artery which demonstrated successful embolization of the superior pancreaticoduodenal arcade. There is preserved patency of the right gastroepiploic artery. The catheters were removed. The right groin access site was closed with a 6 French Angio-Seal device with ultrasound assistance without evidence of complication. Distal pulses were unchanged. The patient tolerated the procedure well was transferred back to the floor in good condition. IMPRESSION: 1. No evidence of active extravasation visualized after extensive angiographic evaluation of the pancreaticoduodenal arteries. 2. Prophylactic coil embolization of the superior pancreaticoduodenal arcade. Marliss Coots, MD [facility] Radiology Electronically Signed   By: Marliss Coots M.D.   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 EXAM: 1. Ultrasound-guided vascular access of the right common femoral artery. 2. Pelvic angiogram 3. Selective catheterization and angiography of the superior mesenteric, dorsal pancreatic, posterior pancreaticoduodenal arcade, celiac, gastroduodenal, and superior pancreaticoduodenal arcade 4. Coil embolization of superior pancreaticoduodenal arcade MEDICATIONS: None. ANESTHESIA/SEDATION: Moderate (conscious) sedation was employed during this procedure. A total of Versed 4 mg  and Fentanyl 150 mcg was administered intravenously. Moderate Sedation Time: 109 minutes. The patient's level of consciousness and vital signs were monitored continuously by radiology nursing throughout the procedure under my direct supervision. CONTRAST:  OMNIPAQUE IOHEXOL 300 MG/ML SOLN, 54mL OMNIPAQUE IOHEXOL 300 MG/ML SOLN, 54mL OMNIPAQUE IOHEXOL 300 MG/ML SOLN FLUOROSCOPY: Radiation Exposure Index (as provided by the fluoroscopic device): 2,180 mGy Kerma COMPLICATIONS: None immediate. PROCEDURE: Informed consent was obtained from the patient following explanation  of the procedure, risks, benefits and alternatives. The patient understands, agrees and consents for the procedure. All questions were addressed. A time out was performed prior to the initiation of the procedure. Maximal barrier sterile technique utilized including caps, mask, sterile gowns, sterile gloves, large sterile drape, hand hygiene, and Betadine prep. Preprocedure ultrasound evaluation demonstrated patency of the right common femoral artery. The procedure was planned. Subdermal Local anesthesia was provided with 1% lidocaine. A small skin nick was made. Under direct ultrasound visualization, a 21 gauge micropuncture needle was directed into the common femoral artery. A ultrasound image was captured and saved in the permanent record. A micropuncture sheath was inserted and limited right lower extremity angiogram was performed which demonstrated adequate puncture site for closure device use. There was difficulty advancing an 035 J wire and a micropuncture sheath into iliac arteries. 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 Using a Glidewire, the catheter was able to be directed to the right common iliac artery. Angiogram demonstrated  severe focal stenosis in the mid right common iliac artery. Wire was able to be navigated into the distal abdominal aorta and the catheter was exchanged for an Omni flush catheter. Dedicated pelvic angiogram was performed. The left common, external, and internal iliac arteries are widely patent. 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 The internal iliac artery is patent. The external iliac artery again demonstrates focal moderate stenosis in its mid to distal segment. The common femoral arteries are patent bilaterally. Guidewire was inserted and the Omni Flush catheter was exchanged for a C2 catheter. Under fluoroscopic visualization, the superior mesenteric artery was selected. Dedicated superior mesenteric angiogram demonstrated patency of the superior mesenteric artery with a proximal branch identified is the dorsal pancreatic artery. There is no evidence of active extravasation. Of note, there is a replaced right hepatic artery arising proximal superior mesenteric artery. A 2.4 French Progreat microcatheter and fathom 14 microwire were used to select the dorsal pancreatic artery. Dedicated angiogram was performed which demonstrated no evidence of active extravasation. The pancreaticoduodenal arcade was then navigated in the catheter tip was positioned in the gastroduodenal artery via retrograde approach. Angiogram from the gastroduodenal artery demonstrated primarily opacification of the right gastroepiploic artery with no evidence of active extravasation. The microcatheter was removed. The 5 French C2 catheter was then redirected superiorly to the celiac trunk. Celiac angiogram demonstrated widely patent a left hepatic artery and splenic artery. The gastroduodenal artery is patent. Glidewire was then advanced to the gastroduodenal artery and dedicated angiogram was performed. There is no evidence of active extravasation in the region of  the pancreatic head. The coaxial microcatheter system was then used to select the superior pancreaticoduodenal arcade. Angiogram was repeated which demonstrated no evidence of active extravasation and its branches. Careful study correlating this location with recent comparison CTA was done and it was deemed that this region was most likely responsible for the previously visualized hemorrhage. Therefore, prophylactic coil embolization was performed of the superior pancreaticoduodenal arcade with several 1-2 mm detachable low-profile Ruby coils. Completion angiogram was performed from the distal gastroduodenal artery which demonstrated successful embolization of the superior pancreaticoduodenal arcade. There is preserved patency of the right gastroepiploic artery. The catheters were removed. The right groin access site was closed with a 6 [place] Angio-Seal device  with ultrasound assistance without evidence of complication. Distal pulses were unchanged. The patient tolerated the procedure well was transferred back to the floor in good condition. IMPRESSION: 1. No evidence of active extravasation visualized after extensive angiographic evaluation of the pancreaticoduodenal arteries. 2. Prophylactic coil embolization of the superior pancreaticoduodenal arcade. Marliss Coots, MD [facility] Radiology Electronically Signed   By: Marliss Coots M.D.   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 EXAM: 1. Ultrasound-guided vascular access of the right common femoral artery. 2. Pelvic angiogram 3. Selective catheterization and angiography of the superior mesenteric, dorsal pancreatic, posterior pancreaticoduodenal arcade, celiac, gastroduodenal, and superior pancreaticoduodenal arcade 4. Coil  embolization of superior pancreaticoduodenal arcade MEDICATIONS: None. ANESTHESIA/SEDATION: Moderate (conscious) sedation was employed during this procedure. A total of Versed 4 mg and Fentanyl 150 mcg was administered intravenously. Moderate Sedation Time: 109 minutes. The patient's level of consciousness and vital signs were monitored continuously by radiology nursing throughout the procedure under my direct supervision. CONTRAST:  OMNIPAQUE IOHEXOL 300 MG/ML SOLN, 54mL OMNIPAQUE IOHEXOL 300 MG/ML SOLN, 54mL OMNIPAQUE IOHEXOL 300 MG/ML SOLN FLUOROSCOPY: Radiation Exposure Index (as provided by the fluoroscopic device): 2,180 mGy Kerma COMPLICATIONS: None immediate. PROCEDURE: Informed consent was obtained from the patient following explanation of the procedure, risks, benefits and alternatives. The patient understands, agrees and consents for the procedure. All questions were addressed. A time out was performed prior to the initiation of the procedure. Maximal barrier sterile technique utilized including caps, mask, sterile gowns, sterile gloves, large sterile drape, hand hygiene, and Betadine prep. Preprocedure ultrasound evaluation demonstrated patency of the right common femoral artery. The procedure was planned. Subdermal Local anesthesia was provided with 1% lidocaine. A small skin nick was made. Under direct ultrasound visualization, a 21 gauge micropuncture needle was directed into the common femoral artery. A ultrasound image was captured and saved in the permanent record. A micropuncture sheath was inserted and limited right lower extremity angiogram was performed which demonstrated adequate puncture site for closure device use. There was difficulty advancing an 035 J wire and a micropuncture sheath into iliac arteries. Micropuncture sheath was exchanged for a 5 French angled tip catheter and angiography of the right iliac vessels was performed which demonstrated severe stenosis in the mid right  external iliac artery, likely secondary to atherosclerotic plaque. Using a Glidewire, the catheter was able to be directed to the right common iliac artery. Angiogram demonstrated severe focal stenosis in the mid right common iliac artery. Wire was able to be navigated into the distal abdominal aorta and the catheter was exchanged for an Omni flush catheter. Dedicated pelvic angiogram was performed. The left common, external, and internal iliac arteries are widely patent. 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 The internal iliac artery is patent. The external iliac artery again demonstrates focal moderate stenosis in its mid to distal segment. The common femoral arteries are patent bilaterally. Guidewire was inserted and the Omni Flush catheter was exchanged for a C2 catheter. Under fluoroscopic visualization, the superior mesenteric artery was selected. Dedicated superior mesenteric angiogram demonstrated patency of the superior mesenteric artery with a proximal branch identified is the dorsal pancreatic artery. There is no evidence of active extravasation. Of note, there is a replaced right hepatic artery arising proximal superior mesenteric artery. A 2.4  [place] Progreat microcatheter and fathom 14 microwire were used to select the dorsal pancreatic artery. Dedicated angiogram was performed which demonstrated no evidence of active extravasation. The pancreaticoduodenal arcade was then navigated in the catheter tip was positioned in the gastroduodenal artery via retrograde approach. Angiogram from the gastroduodenal artery demonstrated primarily opacification of the right gastroepiploic artery with no evidence of active extravasation. The microcatheter was removed. The 5 French C2 catheter was then redirected superiorly to the celiac trunk. Celiac angiogram demonstrated widely patent a left hepatic artery and splenic artery. The  gastroduodenal artery is patent. Glidewire was then advanced to the gastroduodenal artery and dedicated angiogram was performed. There is no evidence of active extravasation in the region of the pancreatic head. The coaxial microcatheter system was then used to select the superior pancreaticoduodenal arcade. Angiogram was repeated which demonstrated no evidence of active extravasation and its branches. Careful study correlating this location with recent comparison CTA was done and it was deemed that this region was most likely responsible for the previously visualized hemorrhage. Therefore, prophylactic coil embolization was performed of the superior pancreaticoduodenal arcade with several 1-2 mm detachable low-profile Ruby coils. Completion angiogram was performed from the distal gastroduodenal artery which demonstrated successful embolization of the superior pancreaticoduodenal arcade. There is preserved patency of the right gastroepiploic artery. The catheters were removed. The right groin access site was closed with a 6 French Angio-Seal device with ultrasound assistance without evidence of complication. Distal pulses were unchanged. The patient tolerated the procedure well was transferred back to the floor in good condition. IMPRESSION: 1. No evidence of active extravasation visualized after extensive angiographic evaluation of the pancreaticoduodenal arteries. 2. Prophylactic coil embolization of the superior pancreaticoduodenal arcade. Marliss Coots, MD [facility] Radiology Electronically Signed   By: Marliss Coots M.D.   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 EXAM: 1. Ultrasound-guided vascular access of the right common femoral artery. 2. Pelvic angiogram 3.  Selective catheterization and angiography of the superior mesenteric, dorsal pancreatic, posterior pancreaticoduodenal arcade, celiac, gastroduodenal, and superior pancreaticoduodenal arcade 4. Coil embolization of superior pancreaticoduodenal arcade MEDICATIONS: None. ANESTHESIA/SEDATION: Moderate (conscious) sedation was employed during this procedure. A total of Versed 4 mg and Fentanyl 150 mcg was administered intravenously. Moderate Sedation Time: 109 minutes. The patient's level of consciousness and vital signs were monitored continuously by radiology nursing throughout the procedure under my direct supervision. CONTRAST:  OMNIPAQUE IOHEXOL 300 MG/ML SOLN, 54mL OMNIPAQUE IOHEXOL 300 MG/ML SOLN, 54mL OMNIPAQUE IOHEXOL 300 MG/ML SOLN FLUOROSCOPY: Radiation Exposure Index (as provided by the fluoroscopic device): 2,180 mGy Kerma COMPLICATIONS: None immediate. PROCEDURE: Informed consent was obtained from the patient following explanation of the procedure, risks, benefits and alternatives. The patient understands, agrees and consents for the procedure. All questions were addressed. A time out was performed prior to the initiation of the procedure. Maximal barrier sterile technique utilized including caps, mask, sterile gowns, sterile gloves, large sterile drape, hand hygiene, and Betadine prep. Preprocedure ultrasound evaluation demonstrated patency of the right common femoral artery. The procedure was planned. Subdermal Local anesthesia was provided with 1% lidocaine. A small skin nick was made. Under direct ultrasound visualization, a 21 gauge micropuncture needle was directed into the common femoral artery. A ultrasound image was captured and saved in the permanent record. A micropuncture sheath was inserted and limited right  lower extremity angiogram was performed which demonstrated adequate puncture site for closure device use. There was difficulty advancing an 035 J wire and a micropuncture sheath into  iliac arteries. 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 Using a Glidewire, the catheter was able to be directed to the right common iliac artery. Angiogram demonstrated severe focal stenosis in the mid right common iliac artery. Wire was able to be navigated into the distal abdominal aorta and the catheter was exchanged for an Omni flush catheter. Dedicated pelvic angiogram was performed. The left common, external, and internal iliac arteries are widely patent. 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 The internal iliac artery is patent. The external iliac artery again demonstrates focal moderate stenosis in its mid to distal segment. The common femoral arteries are patent bilaterally. Guidewire was inserted and the Omni Flush catheter was exchanged for a C2 catheter. Under fluoroscopic visualization, the superior mesenteric artery was selected. Dedicated superior mesenteric angiogram demonstrated patency of the superior mesenteric artery with a proximal branch identified is the dorsal pancreatic artery. There is no evidence of active extravasation. Of note, there is a replaced right hepatic artery arising proximal superior mesenteric artery. A 2.4 French Progreat microcatheter and fathom 14 microwire were used to select the dorsal pancreatic artery. Dedicated angiogram was performed which demonstrated no evidence of active extravasation. The pancreaticoduodenal arcade was then navigated in the catheter tip was positioned in the gastroduodenal artery via retrograde approach. Angiogram from the gastroduodenal artery demonstrated primarily opacification of the right gastroepiploic artery with no evidence of active extravasation. The  microcatheter was removed. The 5 French C2 catheter was then redirected superiorly to the celiac trunk. Celiac angiogram demonstrated widely patent a left hepatic artery and splenic artery. The gastroduodenal artery is patent. Glidewire was then advanced to the gastroduodenal artery and dedicated angiogram was performed. There is no evidence of active extravasation in the region of the pancreatic head. The coaxial microcatheter system was then used to select the superior pancreaticoduodenal arcade. Angiogram was repeated which demonstrated no evidence of active extravasation and its branches. Careful study correlating this location with recent comparison CTA was done and it was deemed that this region was most likely responsible for the previously visualized hemorrhage. Therefore, prophylactic coil embolization was performed of the superior pancreaticoduodenal arcade with several 1-2 mm detachable low-profile Ruby coils. Completion angiogram was performed from the distal gastroduodenal artery which demonstrated successful embolization of the superior pancreaticoduodenal arcade. There is preserved patency of the right gastroepiploic artery. The catheters were removed. The right groin access site was closed with a 6 French Angio-Seal device with ultrasound assistance without evidence of complication. Distal pulses were unchanged. The patient tolerated the procedure well was transferred back to the floor in good condition. IMPRESSION: 1. No evidence of active extravasation visualized after extensive angiographic evaluation of the pancreaticoduodenal arteries. 2. Prophylactic coil embolization of the superior pancreaticoduodenal arcade. Marliss Coots, MD [facility] Radiology Electronically Signed   By: Marliss Coots M.D.   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 EXAM: 1. Ultrasound-guided vascular access  of the right common femoral artery. 2. Pelvic angiogram 3. Selective catheterization and angiography of the superior mesenteric, dorsal pancreatic, posterior pancreaticoduodenal arcade, celiac, gastroduodenal, and superior pancreaticoduodenal arcade 4. Coil embolization of superior pancreaticoduodenal arcade MEDICATIONS: None. ANESTHESIA/SEDATION: Moderate (conscious) sedation was employed during this procedure. A total of Versed 4 mg and Fentanyl 150 mcg was administered intravenously. Moderate Sedation Time: 109 minutes. The patient's level of consciousness and vital signs were monitored continuously by radiology nursing throughout the procedure under my direct supervision. CONTRAST:  OMNIPAQUE IOHEXOL 300 MG/ML SOLN, 54mL OMNIPAQUE IOHEXOL 300 MG/ML SOLN, 54mL OMNIPAQUE IOHEXOL 300 MG/ML SOLN FLUOROSCOPY: Radiation Exposure Index (as provided by the fluoroscopic device): 2,180 mGy Kerma COMPLICATIONS: None immediate. PROCEDURE: Informed consent was obtained from the patient following explanation of the procedure, risks, benefits and alternatives. The patient understands, agrees and consents for the procedure. All questions were addressed. A time out was performed prior to the initiation of the procedure. Maximal barrier sterile technique utilized including caps, mask, sterile gowns, sterile gloves, large sterile drape, hand hygiene, and Betadine prep. Preprocedure ultrasound evaluation demonstrated patency of the right common femoral artery. The procedure was planned. Subdermal Local anesthesia was provided with 1% lidocaine. A small skin nick was made. Under direct ultrasound visualization, a 21 gauge micropuncture needle was directed into the common femoral artery. A ultrasound image was captured and saved in the permanent record. A micropuncture sheath was  inserted and limited right lower extremity angiogram was performed which demonstrated adequate puncture site for closure device use. There was difficulty advancing an 035 J wire and a micropuncture sheath into iliac arteries. 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 Using a Glidewire, the catheter was able to be directed to the right common iliac artery. Angiogram demonstrated severe focal stenosis in the mid right common iliac artery. Wire was able to be navigated into the distal abdominal aorta and the catheter was exchanged for an Omni flush catheter. Dedicated pelvic angiogram was performed. The left common, external, and internal iliac arteries are widely patent. 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 The internal iliac artery is patent. The external iliac artery again demonstrates focal moderate stenosis in its mid to distal segment. The common femoral arteries are patent bilaterally. Guidewire was inserted and the Omni Flush catheter was exchanged for a C2 catheter. Under fluoroscopic visualization, the superior mesenteric artery was selected. Dedicated superior mesenteric angiogram demonstrated patency of the superior mesenteric artery with a proximal branch identified is the dorsal pancreatic artery. There is no evidence of active extravasation. Of note, there is a replaced right hepatic artery arising proximal superior mesenteric artery. A 2.4 French Progreat microcatheter and fathom 14 microwire were used to select the dorsal pancreatic artery. Dedicated angiogram was performed which demonstrated no evidence of active extravasation. The pancreaticoduodenal arcade was then navigated in the catheter tip was positioned in the  gastroduodenal artery via retrograde approach. Angiogram from the gastroduodenal artery demonstrated primarily opacification of the right gastroepiploic artery with no evidence of active extravasation. The microcatheter was removed. The 5 French C2 catheter was then redirected superiorly to the celiac trunk. Celiac angiogram demonstrated widely patent a left hepatic artery and splenic artery. The gastroduodenal artery is patent. Glidewire was then advanced to the gastroduodenal artery and dedicated angiogram  was performed. There is no evidence of active extravasation in the region of the pancreatic head. The coaxial microcatheter system was then used to select the superior pancreaticoduodenal arcade. Angiogram was repeated which demonstrated no evidence of active extravasation and its branches. Careful study correlating this location with recent comparison CTA was done and it was deemed that this region was most likely responsible for the previously visualized hemorrhage. Therefore, prophylactic coil embolization was performed of the superior pancreaticoduodenal arcade with several 1-2 mm detachable low-profile Ruby coils. Completion angiogram was performed from the distal gastroduodenal artery which demonstrated successful embolization of the superior pancreaticoduodenal arcade. There is preserved patency of the right gastroepiploic artery. The catheters were removed. The right groin access site was closed with a 6 French Angio-Seal device with ultrasound assistance without evidence of complication. Distal pulses were unchanged. The patient tolerated the procedure well was transferred back to the floor in good condition. IMPRESSION: 1. No evidence of active extravasation visualized after extensive angiographic evaluation of the pancreaticoduodenal arteries. 2. Prophylactic coil embolization of the superior pancreaticoduodenal arcade. Marliss Coots, MD [facility]  Radiology Electronically Signed   By: Marliss Coots M.D.   On: 09/19/2022 12:55   CT Angio Abd/Pel w/ and/or w/o  Result Date: 09/18/2022 CLINICAL DATA:  Pancreatitis and probable pseudoaneurysm of pancreaticoduodenal arcade by prior imaging. CTA evaluation to reassess pseudoaneurysm and for planning purposes prior to possible arteriography and embolization. EXAM: CT ANGIOGRAPHY ABDOMEN AND PELVIS WITH CONTRAST AND WITHOUT CONTRAST TECHNIQUE: Multidetector CT imaging of the abdomen and pelvis was performed using the standard protocol during bolus administration of intravenous contrast. Multiplanar reconstructed images and MIPs were obtained and reviewed to evaluate the vascular anatomy. RADIATION DOSE REDUCTION: This exam was performed according to the departmental dose-optimization program which includes automated exposure control, adjustment of the mA and/or kV according to patient size and/or use of iterative reconstruction technique. CONTRAST:  OMNIPAQUE IOHEXOL 350 MG/ML SOLN COMPARISON:  CT of the abdomen and pelvis on 09/15/2022 and MRI of the abdomen on 09/16/2022. FINDINGS: VASCULAR Aorta: Diffuse atherosclerosis of the abdominal aorta without aneurysm or dissection. Celiac: Normally patent.  Normally patent branch vessels. SMA: Atherosclerosis of proximal SMA without significant stenosis. Replaced right hepatic artery. 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 This pseudoaneurysm has clearly enlarged since the prior imaging. Exact supply to the pseudoaneurysm is difficult to delineate by current CTA but the distribution would suggest supply from the inferior pancreaticoduodenal arcade supplied by the superior mesenteric artery. Renals: Bilateral  single renal arteries. Proximal atherosclerosis bilaterally without significant stenosis on the right and with stenosis likely approaching 50-60% on the left. IMA: Normally patent. Inflow: Diffuse calcified plaque throughout bilateral common and internal iliac arteries without high-grade stenosis. External iliac arteries demonstrate milder disease without high-grade stenosis. Proximal Outflow: Atherosclerosis of bilateral common femoral arteries without high-grade stenosis. Bilateral femoral bifurcations are patent. 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 There is compensatory significant enlargement of the inferior mesenteric vein that empties into the splenic vein prior to its confluence with the portal vein and also collateral veins communicating  with the inferior mesenteric vein including a large left-sided collateral vein communicating with mesenteric veins. Review of the MIP images confirms the above findings. NON-VASCULAR Lower chest: No acute abnormality. Hepatobiliary: Status post cholecystectomy. No focal liver lesions. Again noted is both intrahepatic and extrahepatic biliary ductal dilatation converging into the area of pancreatitis and felt to likely be on the basis of distal CBD stricture as noted by MRI. This may be a benign stricture due to inflammation. Pancreas: Geographic area of inflammation and probable associated hemorrhage centered along the posterior aspect of the pancreatic head shows no significant change since the prior CT of 09/15/2022 measuring roughly 4.5 cm in diameter. No change in distal ductal dilatation within the body and tail of the pancreas measuring up to 5 mm. No discrete pseudocyst development or overt pancreatic necrosis. No evidence of focal pancreatic abscess.  Spleen: Normal in size without focal abnormality. Adrenals/Urinary Tract: Adrenal glands are unremarkable. Kidneys are normal, without renal calculi, focal lesion, or hydronephrosis. Bladder is unremarkable. Stomach/Bowel: No bowel obstruction or significant ileus. 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 Lymphatic: No enlarged lymph nodes identified. Reproductive: Prostate is unremarkable. Other: No hernias or ascites. Musculoskeletal: Stable degenerative disc disease of the lumbar spine with associated mild rightward convex scoliosis. IMPRESSION: 1. 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 Exact supply to the pseudoaneurysm is difficult to delineate by current CTA but the distribution would suggest supply from the inferior pancreaticoduodenal arcade supplied by the superior mesenteric artery. 2. 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 There is compensatory enlargement of the inferior mesenteric vein that empties into the splenic vein prior to its confluence with the portal vein and also collateral veins communicating with the inferior mesenteric vein including a large left-sided collateral vein communicating with mesenteric veins. 3. No significant change in geographic area of inflammation and probable associated hemorrhage centered along the posterior aspect of the pancreatic head measuring roughly 4.5 cm in diameter. No change in distal ductal dilatation within the body and tail of the pancreas measuring up to 5 mm. No discrete pseudocyst development or overt pancreatic necrosis. 4. Intrahepatic and extrahepatic biliary ductal  dilatation converging into the area of pancreatitis and felt to likely be on the basis of distal CBD stricture as noted by MRI. This may be a benign stricture due to inflammation. 5. Aortoiliac atherosclerosis without aneurysm. Electronically Signed   By: Irish Lack M.D.   On: 09/18/2022 17:11   MR ABDOMEN MRCP W WO CONTAST  Result Date: 09/16/2022 CLINICAL DATA:  Jaundice. History of pancreatitis and pancreatic cyst. EXAM: MRI ABDOMEN WITHOUT AND WITH CONTRAST (INCLUDING MRCP) TECHNIQUE: Multiplanar multisequence MR imaging of the abdomen was performed both before and after the administration of intravenous contrast. Heavily T2-weighted images of the biliary and pancreatic ducts were obtained, and three-dimensional MRCP images were rendered by post processing. CONTRAST:  7mL GADAVIST GADOBUTROL 1 MMOL/ML IV SOLN COMPARISON:  CT 09/14/2022 and older.  MRI 01/02/2022 and older FINDINGS: Lower chest: Mild linear opacity seen in the right lung base likely scar or atelectasis. No pleural effusion. Hepatobiliary: No enhancing space-occupying liver lesion. Patent portal vein. There is diffuse biliary ductal dilatation which is slightly  increased from November 2023. The common duct has a diameter proximally approaching 13 mm but tapers abruptly at the pancreatic head. There is no mural enhancement or nodularity. This could be a benign stricture with a history of pancreatitis. Please correlate for any known history. Pancreas: Severe atrophy of the pancreas with ductal dilatation including side-branch dilatation diffusely. There is stricture, narrowing of the pancreatic head along the pancreatic duct where there is complex heterogeneous masslike area which has a mixed signal on precontrast T1 and T2 with some central bright T2 signal. This area measures a proximally 4.4 x 4.5 cm and could be the sequela of previous pancreatitis. Along the posterior aspect of the structure is a rounded area measuring 12 mm in AP  diameter which shows to be an area of enhancement comparing pre and postcontrast. 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 The lesion in question overall does cause mass effect along surrounding structures causing narrowing of the portal venous confluence. There is a occlusion of the SMV. Several left upper quadrant collateral vessels are identified. Spleen:  Within normal limits in size and appearance. Adrenals/Urinary Tract: No masses identified. No evidence of hydronephrosis. Stomach/Bowel: Visualized portions within the abdomen are unremarkable. Vascular/Lymphatic: Circumaortic left renal vein. Atherosclerotic changes along the aorta. Normal caliber aorta and IVC. No specific abnormal lymph node enlargement identified. Other: There is some stranding along the central mesentery as well as upper retroperitoneum. Trace right upper quadrant ascites. Musculoskeletal: Curvature of the spine with degenerative changes. IMPRESSION: Complex mass, fluid collection once again seen extending caudal to the head and neck of the pancreas. 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 Recommend intervention consultation. Although the lesion very well could be consistent with the history of pancreatitis, with the appearance of the mass lesion, recommend continued surveillance to confirm resolution and exclude secondary pathology. 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 There is also severe dilatation of the pancreatic duct with stricture of the pancreatic head. The lesion also associated with occlusion of the SMV, narrowing of the proximal  portal vein with numerous collateral vessels. Trace ascites and mesenteric stranding. Electronically Signed   By: Karen Kays M.D.   On: 09/16/2022 14:18   MR 3D Recon At Scanner  Result Date: 09/16/2022 CLINICAL DATA:  Jaundice. History of pancreatitis and pancreatic cyst. EXAM: MRI ABDOMEN WITHOUT AND WITH CONTRAST (INCLUDING MRCP) TECHNIQUE: Multiplanar multisequence MR imaging of the abdomen was performed both before and after the administration of intravenous contrast. Heavily T2-weighted images of the biliary and pancreatic ducts were obtained, and three-dimensional MRCP images were rendered by post processing. CONTRAST:  7mL GADAVIST GADOBUTROL 1 MMOL/ML IV SOLN COMPARISON:  CT 09/14/2022 and older.  MRI 01/02/2022 and older FINDINGS: Lower chest: Mild linear opacity seen in the right lung base likely scar or atelectasis. No pleural effusion. Hepatobiliary: No enhancing space-occupying liver lesion. Patent portal vein. There is diffuse biliary ductal dilatation which is slightly increased from November 2023. The common duct has a diameter proximally approaching 13 mm but tapers abruptly at the pancreatic head. There is no mural enhancement or nodularity. This could be a benign  stricture with a history of pancreatitis. Please correlate for any known history. Pancreas: Severe atrophy of the pancreas with ductal dilatation including side-branch dilatation diffusely. There is stricture, narrowing of the pancreatic head along the pancreatic duct where there is complex heterogeneous masslike area which has a mixed signal on precontrast T1 and T2 with some central bright T2 signal. This area measures a proximally 4.4 x 4.5 cm and could be the sequela of previous pancreatitis. Along the posterior aspect of the structure is a rounded area measuring 12 mm in AP diameter which shows to be an area of enhancement comparing pre and postcontrast. 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 The lesion in question overall does cause mass effect along surrounding structures causing narrowing of the portal venous confluence. There is a occlusion of the SMV. Several left upper quadrant collateral vessels are identified. Spleen:  Within normal limits in size and appearance. Adrenals/Urinary Tract: No masses identified. No evidence of hydronephrosis. Stomach/Bowel: Visualized portions within the abdomen are unremarkable. Vascular/Lymphatic: Circumaortic left renal vein. Atherosclerotic changes along the aorta. Normal caliber aorta and IVC. No specific abnormal lymph node enlargement identified. Other: There is some stranding along the central mesentery as well as upper retroperitoneum. Trace right upper quadrant ascites. Musculoskeletal: Curvature of the spine with degenerative changes. IMPRESSION: Complex mass, fluid collection once again seen extending caudal to the head and neck of the pancreas. 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 Recommend intervention consultation. Although the lesion very well could be consistent with the history of pancreatitis, with the appearance of the mass lesion, recommend continued surveillance to confirm resolution and exclude secondary pathology. 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 There is also severe dilatation of the pancreatic duct with stricture of the pancreatic head. The lesion also associated with occlusion of the SMV, narrowing of the proximal portal vein with numerous collateral vessels. Trace ascites and mesenteric stranding. Electronically Signed   By: Karen Kays M.D.   On: 09/16/2022 14:18     Labs:  CBC: Recent Labs    09/16/22 0459 09/17/22 0416 09/18/22 0337 09/19/22 0407 09/19/22 1114  WBC 5.1 5.0 4.6 4.9  --   HGB 9.8* 9.6* 9.8* 9.9* 10.4*  HCT 30.1* 28.8* 29.8* 29.8* 31.6*  PLT 198 191 219 257  --     COAGS: Recent Labs    10/11/21 0900 09/06/22 0210 09/07/22 0400 09/19/22 0407  INR 0.9 1.0 1.1 1.1    BMP: Recent Labs    09/16/22 0459 09/17/22 0416 09/18/22 0337 09/19/22 0934  NA 132* 129* 134* 131*  K 3.8 4.0 3.2* 4.3  CL 99 95* 100 97*  CO2 25 26 26 26   GLUCOSE 95 109* 108* 113*  BUN 7* 8 7* 6*  CALCIUM 8.4* 8.3* 8.3* 8.6*  CREATININE 0.63 0.71 0.66 0.70  GFRNONAA >60 >60 >60 >60    LIVER FUNCTION TESTS: Recent Labs    09/16/22 0459 09/17/22 0416 09/18/22 0337 09/19/22 0934  BILITOT 3.1* 2.1* 1.0 0.8  AST 306* 137* 53* 30  ALT 218* 155* 108* 76*  ALKPHOS 346* 342* 302* 269*  PROT  5.9* 5.9* 6.1* 6.4*  ALBUMIN 2.9* 2.9* 2.8* 2.9*    Assessment and Plan:  S/p pancreatic pseudoaneurysm embolization -Patient tolerated procedure well -No concern for pseudoaneurysm at right groin puncture site  -Hemoglobin 10.4 today (9.8 yesterday) -Continue to monitor hemoglobin -If abdominal pain severe with concomitant acute anemia, recommend repeat CTA abdomen/pelvis GI bleed protocol and notify IR  -IR will follow  Electronically Signed: Kennieth Francois, PA-C 09/19/2022, 4:30 PM   I spent a total of 15 Minutes at the the patient's bedside AND on the patient's hospital floor or unit, greater than 50% of which was counseling/coordinating care for s/p pancreatic pseudotumor embolization.

## 2022-09-19 NOTE — Progress Notes (Signed)
Mobility Specialist - Progress Note   09/19/22 1050  Mobility  Activity Ambulated with assistance in hallway  Level of Assistance Standby assist, set-up cues, supervision of patient - no hands on  Assistive Device Crutches  Distance Ambulated (ft) 450 ft  Range of Motion/Exercises Active  LLE Weight Bearing PWB  Activity Response Tolerated fair  Mobility Referral Yes  $Mobility charge 1 Mobility  Mobility Specialist Start Time (ACUTE ONLY) 1035  Mobility Specialist Stop Time (ACUTE ONLY) 1050  Mobility Specialist Time Calculation (min) (ACUTE ONLY) 15 min   Pt was found in bed and agreeable to ambulate. C/o R hip pain stating "it feels like a knot" during session. At EOS returned to chair with all needs met. Call bell in reach.  Billey Chang Mobility Specialist

## 2022-09-19 NOTE — Progress Notes (Signed)
PROGRESS NOTE  Justin Suarez  DOB: 1955-08-20  PCP: [name], MD WUJ:[id number]  DOA: 09/14/2022  LOS: 4 days  Hospital Day: 6  Brief narrative: Justin Suarez is a 67 y.o. 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 Charlann Boxer. 7/25, patient presented to the ED with complaint of severe epigastric pain, nausea, vomiting.   Drinks about 2-3 beers a day.  Last alcohol use was the day prior.  In the ED, patient was afebrile, initial blood pressure was elevated to 222/97. Initial labs showed WC count of 11.3, hemoglobin 12.2, renal function normal, lipase elevated to 556, AST/ALT/alk phos elevated to 63/96/294. CT abdomen pelvis showed a masslike enlargement of the pancreatic head similar to prior CT and MRI from 2022-findings likely representing acute pancreatitis but unable to exclude a mass.  Calcifications in the pancreas compatible with chronic pancreatitis.  Stable intrahepatic extremity biliary duct dilatation likely due to postcholecystectomy status  Patient required multiple rounds of IV opiates Started on IV fluids, IV analgesics, IV antiemetics Admitted to [facility]  Subjective: Patient was seen and examined this morning.   Sitting up in recliner. Not in distress.  Underwent coil embolization of and superior pancreaticoduodenal arcade yesterday Abdominal pain tolerable. Advanced to regular consistency diet this morning  Hospital course: Acute on chronic alcoholic pancreatitis Suspect acute cholangitis Complex pancreatic mass Elevated liver enzymes Diffuse hepatic steatosis Presented with severe epigastric pain, nausea, vomiting  Lipase was elevated over 500.   CT abdomen with findings of acute pancreatitis.   7/27, MRCP showed complex mass/fluid collection seen in the head and neck of the pancreas also  was a benign stricture of the distal common bile duct with intrahepatic biliary duct dilatation and severe dilation of the pancreatic duct. Started on conservative management with IV fluid, IV analgesics, IV antiemetics Abdominal pain improved.  Lipase level improved. GI following.  Advance to regular diet today On IV Zosyn since fever episode on 7/27.  No recurrence of fever since then. Recent Labs  Lab 09/15/22 0609 09/16/22 0459 09/17/22 0416 09/18/22 0337 09/19/22 0934  LIPASE 351* 96* 49 39 40    Recent Labs  Lab 09/15/22 0609 09/16/22 0459 09/17/22 0416 09/18/22 0337 09/19/22 0407 09/19/22 0934  AST 117* 306* 137* 53*  --  30  ALT 90* 218* 155* 108*  --  76*  ALKPHOS 248* 346* 342* 302*  --  269*  BILITOT 1.0 3.1* 2.1* 1.0  --  0.8  PROT 5.8* 5.9* 5.9* 6.1*  --  6.4*  ALBUMIN 2.9* 2.9* 2.9* 2.8*  --  2.9*  INR  --   --   --   --  1.1  --   LIPASE 351* 96* 49 39  --  40  PLT 230 198 191 219 257  --    Pseudoaneurysm of the pancreaticoduodenal arcade IR consult appreciated.   7/29, CT angiography showed a geographic area of inflammation and probable associated hemorrhage along the posterior aspect of the pancreatic head measuring 4.5 cm, no change in distal duct dilation, no overt pancreatic necrosis.  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 Charlann Boxer. 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  He completed 6 months course of anticoagulation at that time. 08/28/2022, patient underwent hip surgery and since then he has been on Xarelto for DVT prophylaxis Given pseudoaneurysm, will keep him off Xarelto  Chronic alcoholism Impending withdrawal Patient was drinking about 2-3 beers a day probably putting it politely.  Last use was the day prior to  admission Watch out for withdrawal symptoms CIWA protocol to continue  Hypertensive urgency Initial blood pressure was significant elevated over 200 and remained elevated for several hours 170s this morning Gradually improving on metoprolol and amlodipine.  Continue to monitor.   Elevated troponin Aortic atherosclerosis Troponin was mildly elevated in the setting of significant CAD risk factors including HTN, smoking  Denies any anginal symptoms. Likely demand ischemia in the setting of pain and hypertensive urgency Echocardiogram with normal EF, mild LVH and no wall motion abnormality Continue to monitor on telemetry  Current smoker Counseled to quit. Nicotine patch offered   Osteoarthritis Recent left hip fracture s/p IM nailing 7/18 -Dr. Charlann Boxer. Would avoid NSAIDs given high risk of GI bleeding in the setting of alcohol use Continue oxycodone as needed, bowel regimen   Mobility: Encourage ambulation  Goals of care   Code Status: Full Code     DVT prophylaxis:     Antimicrobials: None Fluid: Reduce IV fluid Consultants: None Family Communication: None at bedside  Status: Inpatient Level of care:  Telemetry   Patient from: Home Anticipated d/c to: Pending clinical course Needs to continue in-hospital care:  If hemoglobin stable and no new abdominal pain, tentative plan of discharge tomorrow.   Diet:  Diet Order             Diet Heart Room service appropriate? Yes; Fluid consistency: Thin  Diet effective now                   Scheduled Meds:  amLODipine  10 mg Oral Daily   folic acid  1 mg Oral Daily   metoprolol tartrate  12.5 mg Oral BID   multivitamin with minerals  1 tablet Oral Daily   thiamine  100 mg Oral Daily   Or   thiamine  100 mg Intravenous Daily    PRN meds: acetaminophen, hydrALAZINE, HYDROmorphone (DILAUDID) injection, melatonin, mouth rinse, oxyCODONE, polyethylene glycol, prochlorperazine   Infusions:   sodium chloride 50  mL/hr at 09/19/22 1023   piperacillin-tazobactam (ZOSYN)  IV 3.375 g (09/19/22 1334)    Antimicrobials: Anti-infectives (From admission, onward)    Start     Dose/Rate Route Frequency Ordered Stop   09/17/22 1000  piperacillin-tazobactam (ZOSYN) IVPB 3.375 g        3.375 g 12.5 mL/hr over 240 Minutes Intravenous Every 8 hours 09/17/22 0830         Nutritional status:  Body mass index is 22.02 kg/m.          Objective: Vitals:   09/19/22 1041 09/19/22 1220  BP:  138/72  Pulse:  61  Resp: 17 16  Temp:  97.7 F (36.5 C)  SpO2:  100%    Intake/Output Summary (Last 24 hours) at 09/19/2022 1402 Last data filed at 09/19/2022 0900 Gross per 24 hour  Intake 690 ml  Output 1975 ml  Net -1285 ml   Filed Weights   09/16/22 0043  Weight: 71.6 kg   Weight change:  Body mass index is 22.02 kg/m.   Physical Exam: General exam: Pleasant, elderly Caucasian male.  Not in pain Skin:  No rashes, lesions or ulcers. HEENT: Atraumatic, normocephalic, no obvious bleeding Lungs: Clear to auscultation bilaterally. CVS: Regular rate and rhythm, no murmur GI/Abd soft, tenderness much improved in epigastrium, bowel sound present CNS: Alert, awake, oriented x 3 Psychiatry: Mood appropriate Extremities: No edema, no calf tenderness  Data Review: I have personally reviewed the laboratory data and studies available.  F/u labs ordered Unresulted Labs (From admission, onward)     Start     Ordered   09/20/22 0500  Lipase, blood  Daily,   R     Question:  Specimen collection method  Answer:  Lab=Lab collect   09/19/22 0827   09/20/22 0500  Comprehensive metabolic panel  Daily,   R     Question:  Specimen collection method  Answer:  Lab=Lab collect   09/19/22 0827   09/20/22 0500  CBC with Differential/Platelet  Daily,   R     Question:  Specimen collection method  Answer:  Lab=Lab collect   09/19/22 0827   09/19/22 1655  Hemoglobin and hematocrit, blood  Now then every 6 hours,    R (with TIMED occurrences)     Comments: .5Call MD for HGB 8.5 or less   Question:  Specimen collection method  Answer:  Lab=Lab collect   09/19/22 1055            Total time spent in review of labs and imaging, patient evaluation, formulation of plan, documentation and communication with family: 45 minutes  Signed, Lorin Glass, MD [facility] 09/19/2022

## 2022-09-19 NOTE — Plan of Care (Signed)
  Problem: Education: Goal: Knowledge of the prescribed therapeutic regimen will improve Outcome: Progressing Goal: Understanding of discharge needs will improve Outcome: Progressing   Problem: Activity: Goal: Ability to avoid complications of mobility impairment will improve Outcome: Progressing Goal: Ability to tolerate increased activity will improve Outcome: Progressing   Problem: Clinical Measurements: Goal: Postoperative complications will be avoided or minimized Outcome: Progressing   Problem: Pain Management: Goal: Pain level will decrease with appropriate interventions Outcome: Progressing   Problem: Education: Goal: Knowledge of General Education information will improve Description: Including pain rating scale, medication(s)/side effects and non-pharmacologic comfort measures Outcome: Progressing   Problem: Health Behavior/Discharge Planning: Goal: Ability to manage health-related needs will improve Outcome: Progressing   Problem: Clinical Measurements: Goal: Ability to maintain clinical measurements within normal limits will improve Outcome: Progressing Goal: Respiratory complications will improve Outcome: Progressing Goal: Cardiovascular complication will be avoided Outcome: Progressing   Problem: Activity: Goal: Risk for activity intolerance will decrease Outcome: Progressing   Problem: Nutrition: Goal: Adequate nutrition will be maintained Outcome: Progressing   Problem: Coping: Goal: Level of anxiety will decrease Outcome: Progressing   Problem: Elimination: Goal: Will not experience complications related to bowel motility Outcome: Progressing Goal: Will not experience complications related to urinary retention Outcome: Progressing   Problem: Pain Managment: Goal: General experience of comfort will improve Outcome: Progressing   Problem: Safety: Goal: Ability to remain free from injury will improve Outcome: Progressing   Problem: Skin  Integrity: Goal: Risk for impaired skin integrity will decrease Outcome: Progressing   Problem: Activity: Goal: Ability to return to baseline activity level will improve Outcome: Progressing

## 2022-09-19 NOTE — Progress Notes (Addendum)
Patient ID: Justin Suarez, male   DOB: 07/05/1955, 67 y.o.   MRN: [id number]    Progress Note   Subjective   Day # 5 CC; severe epigastric pain, nausea vomiting in setting of history of chronic pancreatitis Recent hip fracture and repair History of DVT/PE-Xarelto, currently on hold  Patient is status post pelvic angiogram and catheterization and angiography of the SMA, inferior pancreaticoduodenal arcade, celiac, gastroduodenal and superior pancreaticoduodenal arcade arteries with coil embolization of superior pancreaticoduodenal arcade for enlarging pseudoaneurysm  Patient is sitting up in the chair with feet propped up, had walked with PT.  He says he is actually feeling better and having less abdominal pain than he was yesterday, feels better after procedure earlier this morning.  He feels that he could eat some regular food. No significant abdominal pain at all, no nausea or lightheadedness  Serial hemoglobins had not been ordered-have ordered and hemoglobin 10.4/hematocrit 31.6 at 11:14 AM-stable   Objective   Vital signs in last 24 hours: Temp:  [97.5 F (36.4 C)-98.4 F (36.9 C)] 97.9 F (36.6 C) (07/30 0437) Pulse Rate:  [57-75] 60 (07/30 0437) Resp:  [15-31] 20 (07/30 1004) BP: (129-183)/(62-86) 129/67 (07/30 0437) SpO2:  [97 %-100 %] 98 % (07/30 0437) Last BM Date : 09/13/22 General: Older white male in NAD Heart:  Regular rate and rhythm; no murmurs Lungs: Respirations even and unlabored, lungs CTA bilaterally Abdomen:  Soft, distended, bowel sounds are present he is less tender than yesterday across the epigastrium Extremities:  Without edema. Neurologic:  Alert and oriented,  grossly normal neurologically. Psych:  Cooperative. Normal mood and affect.  Intake/Output from previous day: 07/29 0701 - 07/30 0700 In: 1610 [P.O.:1610] Out: 2525 [Urine:2525] Intake/Output this shift: Total I/O In: 0  Out: 400 [Urine:400]  Lab Results: Recent Labs     09/17/22 0416 09/18/22 0337 09/19/22 0407  WBC 5.0 4.6 4.9  HGB 9.6* 9.8* 9.9*  HCT 28.8* 29.8* 29.8*  PLT 191 219 257   BMET Recent Labs    09/17/22 0416 09/18/22 0337 09/19/22 0934  NA 129* 134* 131*  K 4.0 3.2* 4.3  CL 95* 100 97*  CO2 26 26 26   GLUCOSE 109* 108* 113*  BUN 8 7* 6*  CREATININE 0.71 0.66 0.70  CALCIUM 8.3* 8.3* 8.6*   LFT Recent Labs    09/19/22 0934  PROT 6.4*  ALBUMIN 2.9*  AST 30  ALT 76*  ALKPHOS 269*  BILITOT 0.8   PT/INR Recent Labs    09/19/22 0407  LABPROT 14.4  INR 1.1    Studies/Results: CT Angio Abd/Pel w/ and/or w/o  Result Date: 09/18/2022 CLINICAL DATA:  Pancreatitis and probable pseudoaneurysm of pancreaticoduodenal arcade by prior imaging. CTA evaluation to reassess pseudoaneurysm and for planning purposes prior to possible arteriography and embolization. EXAM: CT ANGIOGRAPHY ABDOMEN AND PELVIS WITH CONTRAST AND WITHOUT CONTRAST TECHNIQUE: Multidetector CT imaging of the abdomen and pelvis was performed using the standard protocol during bolus administration of intravenous contrast. Multiplanar reconstructed images and MIPs were obtained and reviewed to evaluate the vascular anatomy. RADIATION DOSE REDUCTION: This exam was performed according to the departmental dose-optimization program which includes automated exposure control, adjustment of the mA and/or kV according to patient size and/or use of iterative reconstruction technique. CONTRAST:  OMNIPAQUE IOHEXOL 350 MG/ML SOLN COMPARISON:  CT of the abdomen and pelvis on 09/15/2022 and MRI of the abdomen on 09/16/2022. FINDINGS: VASCULAR Aorta: Diffuse atherosclerosis of the abdominal aorta without aneurysm or dissection. Celiac:  Normally patent.  Normally patent branch vessels. SMA: Atherosclerosis of proximal SMA without significant stenosis. Replaced right hepatic artery. 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 This pseudoaneurysm has clearly enlarged since the prior imaging. Exact supply to the pseudoaneurysm is difficult to delineate by current CTA but the distribution would suggest supply from the inferior pancreaticoduodenal arcade supplied by the superior mesenteric artery. Renals: Bilateral single renal arteries. Proximal atherosclerosis bilaterally without significant stenosis on the right and with stenosis likely approaching 50-60% on the left. IMA: Normally patent. Inflow: Diffuse calcified plaque throughout bilateral common and internal iliac arteries without high-grade stenosis. External iliac arteries demonstrate milder disease without high-grade stenosis. Proximal Outflow: Atherosclerosis of bilateral common femoral arteries without high-grade stenosis. Bilateral femoral bifurcations are patent. 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 There is compensatory significant enlargement of the inferior mesenteric vein that empties into the splenic vein prior to its confluence with the portal vein and also collateral veins communicating with the inferior mesenteric vein including a large left-sided collateral vein communicating with mesenteric veins. Review of the MIP images confirms the above findings. NON-VASCULAR Lower chest: No acute abnormality. Hepatobiliary: Status post cholecystectomy. No focal liver lesions. Again noted is both intrahepatic and extrahepatic biliary ductal dilatation converging into the area of pancreatitis and felt to likely be on the basis of distal CBD stricture as noted by MRI. This may be a benign  stricture due to inflammation. Pancreas: Geographic area of inflammation and probable associated hemorrhage centered along the posterior aspect of the pancreatic head shows no significant change since the prior CT of 09/15/2022 measuring roughly 4.5 cm in diameter. No change in distal ductal dilatation within the body and tail of the pancreas measuring up to 5 mm. No discrete pseudocyst development or overt pancreatic necrosis. No evidence of focal pancreatic abscess. Spleen: Normal in size without focal abnormality. Adrenals/Urinary Tract: Adrenal glands are unremarkable. Kidneys are normal, without renal calculi, focal lesion, or hydronephrosis. Bladder is unremarkable. Stomach/Bowel: No bowel obstruction or significant ileus. 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 Lymphatic: No enlarged lymph nodes identified. Reproductive: Prostate is unremarkable. Other: No hernias or ascites. Musculoskeletal: Stable degenerative disc disease of the lumbar spine with associated mild rightward convex scoliosis. IMPRESSION: 1. 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 Exact supply to the pseudoaneurysm is difficult to delineate by current CTA but the distribution would suggest supply from the inferior pancreaticoduodenal arcade supplied by the superior mesenteric artery. 2. 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 There is compensatory enlargement of the inferior mesenteric vein that empties into the splenic vein prior to its confluence with the portal vein and also collateral veins communicating with the inferior mesenteric vein including a large  left-sided collateral vein communicating with mesenteric veins. 3. No significant change in geographic  area of inflammation and probable associated hemorrhage centered along the posterior aspect of the pancreatic head measuring roughly 4.5 cm in diameter. No change in distal ductal dilatation within the body and tail of the pancreas measuring up to 5 mm. No discrete pseudocyst development or overt pancreatic necrosis. 4. Intrahepatic and extrahepatic biliary ductal dilatation converging into the area of pancreatitis and felt to likely be on the basis of distal CBD stricture as noted by MRI. This may be a benign stricture due to inflammation. 5. Aortoiliac atherosclerosis without aneurysm. Electronically Signed   By: Irish Lack M.D.   On: 09/18/2022 17:11       Assessment / Plan:    #45 67 year old white male with history of chronic pancreatitis, who had a fall and left hip fracture on 09/07/2022, status postrepair.  Admitted 4 days ago with severe upper abdominal pain nausea and vomiting -acute onset  Workup on admit felt consistent with acute exacerbation of his chronic pancreatitis.  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 showed a geographic area of inflammation and probable associated hemorrhage along the posterior aspect of the pancreatic head measuring 4.5 cm no change in distal duct dilation, no overt pancreatic necrosis.  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  He has had successful coil embolization this morning. Vitals have been stable and he says he actually feels better than yesterday  HGB  stable at 10.4   Suspect he had rather acutely developed to the pseudoaneurysm which has enlarged just over the past few days and may have been responsible for most of his pain  He does have a CBD stricture distally, bili inflammatory-LFTs improved/T. bili 0.8  Plan; serial hemoglobins every 2 hours this afternoon Per  IR if patient develops increased abdominal pain or acute anemia would need stat CTA abdomen and pelvis GI bleed protocol and notify IR  Patient needs to stay off Xarelto, will confirm with IR when it would be safe to resume-I have discontinued the Xarelto from the order so that he will not inadvertently be dosed    Will advance diet to low-fat  Principal Problem:   Pancreatitis    LOS: 4 days   Amy EsterwoodPA-C  09/19/2022, 10:41 AM  GI ATTENDING  Interval history data reviewed.  Patient seen and examined.  Agree with interval progress note as outlined above.  Appreciate IR help.  Observing him post coil embolization.  If he needs chronic anticoagulation, due to history of DVT with PE, IVC filter could be considered if not an anticoagulation candidate.  Agree with advancing diet.  Will follow.  Wilhemina Bonito. Eda Keys., M.D. [facility] Division of Gastroenterology

## 2022-09-20 DIAGNOSIS — K859 Acute pancreatitis without necrosis or infection, unspecified: Secondary | ICD-10-CM | POA: Diagnosis not present

## 2022-09-20 DIAGNOSIS — K852 Alcohol induced acute pancreatitis without necrosis or infection: Secondary | ICD-10-CM | POA: Diagnosis not present

## 2022-09-20 DIAGNOSIS — Z7901 Long term (current) use of anticoagulants: Secondary | ICD-10-CM | POA: Diagnosis not present

## 2022-09-20 DIAGNOSIS — R1013 Epigastric pain: Secondary | ICD-10-CM | POA: Diagnosis not present

## 2022-09-20 DIAGNOSIS — R112 Nausea with vomiting, unspecified: Secondary | ICD-10-CM | POA: Diagnosis not present

## 2022-09-20 NOTE — Progress Notes (Addendum)
Patient ID: Justin Suarez, male   DOB: 05-Sep-1955, 67 y.o.   MRN: [id number]    Progress Note   Subjective   Day # 6 CC  Labs today-WBC 5.5/hemoglobin 9.1//hematocrit 27.6 down from hemoglobin 10.4 early yesterday Sodium 134/potassium 3.9/BUN 7/creatinine 0.9 T. bili 0.8/alk phos 251/AST 44/ALT 61-trending down  Resting comfortably, has no complaints of abdominal pain today says he feels pretty good, able to eat solid food without difficulty.-He has canceled his beach trip which was planned for later this week   Objective   Vital signs in last 24 hours: Temp:  [97.7 F (36.5 C)-98.7 F (37.1 C)] 98 F (36.7 C) (07/31 0513) Pulse Rate:  [59-67] 59 (07/31 0513) Resp:  [16-19] 19 (07/31 0513) BP: (125-138)/(62-72) 125/62 (07/31 0513) SpO2:  [99 %-100 %] 99 % (07/31 0513) Last BM Date : 09/18/22 General:   Older white male in NAD Heart:  Regular rate and rhythm; no murmurs Lungs: Respirations even and unlabored, lungs CTA bilaterally Abdomen:  Soft, nontender and nondistended. Normal bowel sounds. Extremities:  Without edema. Neurologic:  Alert and oriented,  grossly normal neurologically. Psych:  Cooperative. Normal mood and affect.  Intake/Output from previous day: 07/30 0701 - 07/31 0700 In: 0  Out: 3225 [Urine:3225] Intake/Output this shift: Total I/O In: 360 [P.O.:360] Out: 1300 [Urine:1300]  Lab Results: Recent Labs    09/18/22 0337 09/19/22 0407 09/19/22 1114 09/19/22 1704 09/19/22 2302 09/20/22 0421  WBC 4.6 4.9  --   --   --  5.5  HGB 9.8* 9.9*   < > 9.8* 9.6* 9.1*  HCT 29.8* 29.8*   < > 30.8* 29.2* 27.6*  PLT 219 257  --   --   --  246   < > = values in this interval not displayed.   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 EXAM: 1. Ultrasound-guided vascular access of the right common femoral artery. 2. Pelvic angiogram 3. Selective catheterization and angiography of the superior mesenteric, dorsal pancreatic, posterior pancreaticoduodenal arcade, celiac, gastroduodenal, and superior pancreaticoduodenal arcade 4. Coil embolization of superior pancreaticoduodenal arcade MEDICATIONS: None. ANESTHESIA/SEDATION: Moderate (conscious) sedation was employed during this procedure. A total of Versed 4 mg and Fentanyl 150 mcg was administered intravenously. Moderate Sedation Time: 109 minutes. The patient's level of consciousness and vital signs were monitored continuously by radiology nursing throughout the procedure under my direct supervision. CONTRAST:  OMNIPAQUE IOHEXOL 300 MG/ML SOLN, 54mL OMNIPAQUE IOHEXOL 300 MG/ML SOLN, 54mL OMNIPAQUE IOHEXOL 300 MG/ML SOLN FLUOROSCOPY: Radiation Exposure Index (as provided by the fluoroscopic device): 2,180 mGy Kerma COMPLICATIONS: None immediate. PROCEDURE: Informed consent was obtained from the patient following explanation of the procedure, risks, benefits and alternatives. The patient understands, agrees and consents for the procedure. All questions were addressed. A time out was performed prior to  the initiation of the procedure. Maximal barrier sterile technique utilized including caps, mask, sterile gowns, sterile gloves, large sterile drape, hand hygiene, and Betadine prep. Preprocedure ultrasound evaluation demonstrated patency of the right common femoral artery. The procedure was planned. Subdermal Local anesthesia was provided  with 1% lidocaine. A small skin nick was made. Under direct ultrasound visualization, a 21 gauge micropuncture needle was directed into the common femoral artery. A ultrasound image was captured and saved in the permanent record. A micropuncture sheath was inserted and limited right lower extremity angiogram was performed which demonstrated adequate puncture site for closure device use. There was difficulty advancing an 035 J wire and a micropuncture sheath into iliac arteries. 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 Using a Glidewire, the catheter was able to be directed to the right common iliac artery. Angiogram demonstrated severe focal stenosis in the mid right common iliac artery. Wire was able to be navigated into the distal abdominal aorta and the catheter was exchanged for an Omni flush catheter. Dedicated pelvic angiogram was performed. The left common, external, and internal iliac arteries are widely patent. 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 The internal iliac artery is patent. The external iliac artery again demonstrates focal moderate stenosis in its mid to distal segment. The common femoral arteries are patent bilaterally. Guidewire was inserted and the Omni Flush catheter was exchanged for a C2 catheter. Under fluoroscopic visualization, the superior mesenteric artery was selected. Dedicated superior mesenteric angiogram demonstrated patency of the superior mesenteric artery with a proximal branch identified is the dorsal pancreatic artery. There is no evidence of active extravasation. Of note, there is a replaced right hepatic artery arising proximal superior mesenteric artery. A 2.4 French Progreat microcatheter and  fathom 14 microwire were used to select the dorsal pancreatic artery. Dedicated angiogram was performed which demonstrated no evidence of active extravasation. The pancreaticoduodenal arcade was then navigated in the catheter tip was positioned in the gastroduodenal artery via retrograde approach. Angiogram from the gastroduodenal artery demonstrated primarily opacification of the right gastroepiploic artery with no evidence of active extravasation. The microcatheter was removed. The 5 French C2 catheter was then redirected superiorly to the celiac trunk. Celiac angiogram demonstrated widely patent a left hepatic artery and splenic artery. The gastroduodenal artery is patent. Glidewire was then advanced to the gastroduodenal artery and dedicated angiogram was performed. There is no evidence of active extravasation in the region of the pancreatic head. The coaxial microcatheter system was then used to select the superior pancreaticoduodenal arcade. Angiogram was repeated which demonstrated no evidence of active extravasation and its branches. Careful study correlating this location with recent comparison CTA was done and it was deemed that this region was most likely responsible for the previously visualized hemorrhage. Therefore, prophylactic coil embolization was performed of the superior pancreaticoduodenal arcade with several 1-2 mm detachable low-profile Ruby coils. Completion angiogram was performed from the distal gastroduodenal artery which demonstrated successful embolization of the superior pancreaticoduodenal arcade. There is preserved patency of the right gastroepiploic artery. The catheters were removed. The right groin access site was closed with a 6 French Angio-Seal device with ultrasound assistance without evidence of complication. Distal pulses were unchanged. The patient tolerated the procedure well was transferred back to the floor in good condition. IMPRESSION:  1. No evidence of active  extravasation visualized after extensive angiographic evaluation of the pancreaticoduodenal arteries. 2. Prophylactic coil embolization of the superior pancreaticoduodenal arcade. Marliss Coots, MD [facility] Radiology Electronically Signed   By: Marliss Coots M.D.   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 EXAM: 1. Ultrasound-guided vascular access of the right common femoral artery. 2. Pelvic angiogram 3. Selective catheterization and angiography of the superior mesenteric, dorsal pancreatic, posterior pancreaticoduodenal arcade, celiac, gastroduodenal, and superior pancreaticoduodenal arcade 4. Coil embolization of superior pancreaticoduodenal arcade MEDICATIONS: None. ANESTHESIA/SEDATION: Moderate (conscious) sedation was employed during this procedure. A total of Versed 4 mg and Fentanyl 150 mcg was administered intravenously. Moderate Sedation Time: 109 minutes. The patient's level of consciousness and vital signs were monitored continuously by radiology nursing throughout the procedure under my direct supervision. CONTRAST:  OMNIPAQUE IOHEXOL 300 MG/ML SOLN, 54mL OMNIPAQUE IOHEXOL 300 MG/ML SOLN, 54mL OMNIPAQUE IOHEXOL 300 MG/ML SOLN FLUOROSCOPY: Radiation Exposure Index (as provided by the fluoroscopic device): 2,180 mGy Kerma COMPLICATIONS: None immediate. PROCEDURE: Informed consent was obtained from the patient following explanation of the procedure, risks, benefits and alternatives. The patient understands, agrees and consents for the procedure. All questions were addressed. A time out was performed prior to the initiation of the procedure. Maximal barrier sterile technique utilized including caps, mask, sterile gowns, sterile gloves, large sterile drape, hand hygiene, and  Betadine prep. Preprocedure ultrasound evaluation demonstrated patency of the right common femoral artery. The procedure was planned. Subdermal Local anesthesia was provided with 1% lidocaine. A small skin nick was made. Under direct ultrasound visualization, a 21 gauge micropuncture needle was directed into the common femoral artery. A ultrasound image was captured and saved in the permanent record. A micropuncture sheath was inserted and limited right lower extremity angiogram was performed which demonstrated adequate puncture site for closure device use. There was difficulty advancing an 035 J wire and a micropuncture sheath into iliac arteries. 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 Using a Glidewire, the catheter was able to be directed to the right common iliac artery. Angiogram demonstrated severe focal stenosis in the mid right common iliac artery. Wire was able to be navigated into the distal abdominal aorta and the catheter was exchanged for an Omni flush catheter. Dedicated pelvic angiogram was performed. The left common, external, and internal iliac arteries are widely patent. 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 The internal iliac artery is patent. The external iliac artery again demonstrates focal moderate stenosis in its mid to distal segment. The common femoral arteries are patent bilaterally. Guidewire was inserted and the Omni Flush catheter was exchanged for a C2 catheter. Under fluoroscopic visualization, the superior mesenteric artery was selected. Dedicated superior mesenteric angiogram demonstrated patency of the superior mesenteric artery with a proximal branch identified is the dorsal pancreatic artery. There is  no evidence of active extravasation. Of note, there is a replaced right hepatic artery arising proximal superior mesenteric artery. A 2.4 French Progreat microcatheter and fathom 14 microwire were used to select the dorsal pancreatic artery. Dedicated angiogram was performed which demonstrated no evidence of active extravasation.  The pancreaticoduodenal arcade was then navigated in the catheter tip was positioned in the gastroduodenal artery via retrograde approach. Angiogram from the gastroduodenal artery demonstrated primarily opacification of the right gastroepiploic artery with no evidence of active extravasation. The microcatheter was removed. The 5 French C2 catheter was then redirected superiorly to the celiac trunk. Celiac angiogram demonstrated widely patent a left hepatic artery and splenic artery. The gastroduodenal artery is patent. Glidewire was then advanced to the gastroduodenal artery and dedicated angiogram was performed. There is no evidence of active extravasation in the region of the pancreatic head. The coaxial microcatheter system was then used to select the superior pancreaticoduodenal arcade. Angiogram was repeated which demonstrated no evidence of active extravasation and its branches. Careful study correlating this location with recent comparison CTA was done and it was deemed that this region was most likely responsible for the previously visualized hemorrhage. Therefore, prophylactic coil embolization was performed of the superior pancreaticoduodenal arcade with several 1-2 mm detachable low-profile Ruby coils. Completion angiogram was performed from the distal gastroduodenal artery which demonstrated successful embolization of the superior pancreaticoduodenal arcade. There is preserved patency of the right gastroepiploic artery. The catheters were removed. The right groin access site was closed with a 6 French Angio-Seal device with ultrasound assistance without evidence of complication.  Distal pulses were unchanged. The patient tolerated the procedure well was transferred back to the floor in good condition. IMPRESSION: 1. No evidence of active extravasation visualized after extensive angiographic evaluation of the pancreaticoduodenal arteries. 2. Prophylactic coil embolization of the superior pancreaticoduodenal arcade. Marliss Coots, MD [facility] Radiology Electronically Signed   By: Marliss Coots M.D.   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 EXAM: 1. Ultrasound-guided vascular access of the right common femoral artery. 2. Pelvic angiogram 3. Selective catheterization and angiography of the superior mesenteric, dorsal pancreatic, posterior pancreaticoduodenal arcade, celiac, gastroduodenal, and superior pancreaticoduodenal arcade 4. Coil embolization of superior pancreaticoduodenal arcade MEDICATIONS: None. ANESTHESIA/SEDATION: Moderate (conscious) sedation was employed during this procedure. A total of Versed 4 mg and Fentanyl 150 mcg was administered intravenously. Moderate Sedation Time: 109 minutes. The patient's level of consciousness and vital signs were monitored continuously by radiology nursing throughout the procedure under my direct supervision. CONTRAST:  OMNIPAQUE IOHEXOL 300 MG/ML SOLN, 54mL OMNIPAQUE IOHEXOL 300 MG/ML SOLN, 54mL OMNIPAQUE IOHEXOL 300 MG/ML SOLN FLUOROSCOPY: Radiation Exposure Index (as provided by the fluoroscopic device): 2,180 mGy Kerma COMPLICATIONS: None immediate. PROCEDURE: Informed consent was obtained from the patient following explanation of the procedure, risks, benefits and alternatives. The patient understands, agrees and consents for the procedure. All questions were addressed. A time out was performed  prior to the initiation of the procedure. Maximal barrier sterile technique utilized including caps, mask, sterile gowns, sterile gloves, large sterile drape, hand hygiene, and Betadine prep. Preprocedure ultrasound evaluation demonstrated patency of the right common femoral artery. The procedure was planned. Subdermal Local anesthesia was provided with 1% lidocaine. A small skin nick was made. Under direct ultrasound visualization, a 21 gauge micropuncture needle was directed into the common femoral artery. A ultrasound image was captured and saved in the permanent record. A micropuncture sheath was inserted and limited right lower extremity angiogram was performed which demonstrated adequate puncture site for closure device use. There was difficulty advancing an 035 J wire and a micropuncture sheath  into iliac arteries. 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 Using a Glidewire, the catheter was able to be directed to the right common iliac artery. Angiogram demonstrated severe focal stenosis in the mid right common iliac artery. Wire was able to be navigated into the distal abdominal aorta and the catheter was exchanged for an Omni flush catheter. Dedicated pelvic angiogram was performed. The left common, external, and internal iliac arteries are widely patent. 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 The internal iliac artery is patent. The external iliac artery again demonstrates focal moderate stenosis in its mid to distal segment. The common femoral arteries are patent bilaterally. Guidewire was inserted and the Omni Flush catheter was exchanged for a C2 catheter. Under fluoroscopic visualization, the superior mesenteric artery was  selected. Dedicated superior mesenteric angiogram demonstrated patency of the superior mesenteric artery with a proximal branch identified is the dorsal pancreatic artery. There is no evidence of active extravasation. Of note, there is a replaced right hepatic artery arising proximal superior mesenteric artery. A 2.4 French Progreat microcatheter and fathom 14 microwire were used to select the dorsal pancreatic artery. Dedicated angiogram was performed which demonstrated no evidence of active extravasation. The pancreaticoduodenal arcade was then navigated in the catheter tip was positioned in the gastroduodenal artery via retrograde approach. Angiogram from the gastroduodenal artery demonstrated primarily opacification of the right gastroepiploic artery with no evidence of active extravasation. The microcatheter was removed. The 5 French C2 catheter was then redirected superiorly to the celiac trunk. Celiac angiogram demonstrated widely patent a left hepatic artery and splenic artery. The gastroduodenal artery is patent. Glidewire was then advanced to the gastroduodenal artery and dedicated angiogram was performed. There is no evidence of active extravasation in the region of the pancreatic head. The coaxial microcatheter system was then used to select the superior pancreaticoduodenal arcade. Angiogram was repeated which demonstrated no evidence of active extravasation and its branches. Careful study correlating this location with recent comparison CTA was done and it was deemed that this region was most likely responsible for the previously visualized hemorrhage. Therefore, prophylactic coil embolization was performed of the superior pancreaticoduodenal arcade with several 1-2 mm detachable low-profile Ruby coils. Completion angiogram was performed from the distal gastroduodenal artery which demonstrated successful embolization of the superior pancreaticoduodenal arcade. There is preserved patency of the right  gastroepiploic artery. The catheters were removed. The right groin access site was closed with a 6 French Angio-Seal device with ultrasound assistance without evidence of complication. Distal pulses were unchanged. The patient tolerated the procedure well was transferred back to the floor in good condition. IMPRESSION: 1. No evidence of active extravasation visualized after extensive angiographic evaluation of the pancreaticoduodenal arteries. 2. Prophylactic coil embolization of the superior pancreaticoduodenal arcade. Marliss Coots, MD [facility] Radiology Electronically Signed   By: Marliss Coots M.D.   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 EXAM: 1. Ultrasound-guided vascular access of the right common femoral artery. 2. Pelvic angiogram 3. Selective catheterization and angiography of the superior mesenteric, dorsal pancreatic, posterior pancreaticoduodenal arcade, celiac, gastroduodenal, and superior pancreaticoduodenal arcade 4. Coil embolization of superior pancreaticoduodenal  arcade MEDICATIONS: None. ANESTHESIA/SEDATION: Moderate (conscious) sedation was employed during this procedure. A total of Versed 4 mg and Fentanyl 150 mcg was administered intravenously. Moderate Sedation Time: 109 minutes. The patient's level of consciousness and vital signs were monitored continuously by radiology nursing throughout the procedure under my direct supervision. CONTRAST:  OMNIPAQUE IOHEXOL 300 MG/ML SOLN, 54mL OMNIPAQUE IOHEXOL 300 MG/ML SOLN, 54mL OMNIPAQUE IOHEXOL 300 MG/ML SOLN FLUOROSCOPY: Radiation Exposure Index (as provided by the fluoroscopic device): 2,180 mGy Kerma COMPLICATIONS: None immediate. PROCEDURE: Informed consent was obtained from the patient following explanation of  the procedure, risks, benefits and alternatives. The patient understands, agrees and consents for the procedure. All questions were addressed. A time out was performed prior to the initiation of the procedure. Maximal barrier sterile technique utilized including caps, mask, sterile gowns, sterile gloves, large sterile drape, hand hygiene, and Betadine prep. Preprocedure ultrasound evaluation demonstrated patency of the right common femoral artery. The procedure was planned. Subdermal Local anesthesia was provided with 1% lidocaine. A small skin nick was made. Under direct ultrasound visualization, a 21 gauge micropuncture needle was directed into the common femoral artery. A ultrasound image was captured and saved in the permanent record. A micropuncture sheath was inserted and limited right lower extremity angiogram was performed which demonstrated adequate puncture site for closure device use. There was difficulty advancing an 035 J wire and a micropuncture sheath into iliac arteries. 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 Using a Glidewire, the catheter was able to be directed to the right common iliac artery. Angiogram demonstrated severe focal stenosis in the mid right common iliac artery. Wire was able to be navigated into the distal abdominal aorta and the catheter was exchanged for an Omni flush catheter. Dedicated pelvic angiogram was performed. The left common, external, and internal iliac arteries are widely patent. 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 The internal iliac artery is patent. The external iliac artery again demonstrates focal moderate stenosis in its mid to distal segment. The common femoral arteries are patent  bilaterally. Guidewire was inserted and the Omni Flush catheter was exchanged for a C2 catheter. Under fluoroscopic visualization, the superior mesenteric artery was selected. Dedicated superior mesenteric angiogram demonstrated patency of the superior mesenteric artery with a proximal branch identified is the dorsal pancreatic artery. There is no evidence of active extravasation. Of note, there is a replaced right hepatic artery arising proximal superior mesenteric artery. A 2.4 French Progreat microcatheter and fathom 14 microwire were used to select the dorsal pancreatic artery. Dedicated angiogram was performed which demonstrated no evidence of active extravasation. The pancreaticoduodenal arcade was then navigated in the catheter tip was positioned in the gastroduodenal artery via retrograde approach. Angiogram from the gastroduodenal artery demonstrated primarily opacification of the right gastroepiploic artery with no evidence of active extravasation. The microcatheter was removed. The 5 French C2 catheter was then redirected superiorly to the celiac trunk. Celiac angiogram demonstrated widely patent a left hepatic artery and splenic artery. The gastroduodenal artery is patent. Glidewire was then advanced to the gastroduodenal artery and dedicated angiogram was performed. There is no evidence of active extravasation in the region of the pancreatic head. The coaxial microcatheter system was then used to select the superior pancreaticoduodenal arcade. Angiogram was repeated which demonstrated no  evidence of active extravasation and its branches. Careful study correlating this location with recent comparison CTA was done and it was deemed that this region was most likely responsible for the previously visualized hemorrhage. Therefore, prophylactic coil embolization was performed of the superior pancreaticoduodenal arcade with several 1-2 mm detachable low-profile Ruby coils. Completion angiogram was performed  from the distal gastroduodenal artery which demonstrated successful embolization of the superior pancreaticoduodenal arcade. There is preserved patency of the right gastroepiploic artery. The catheters were removed. The right groin access site was closed with a 6 French Angio-Seal device with ultrasound assistance without evidence of complication. Distal pulses were unchanged. The patient tolerated the procedure well was transferred back to the floor in good condition. IMPRESSION: 1. No evidence of active extravasation visualized after extensive angiographic evaluation of the pancreaticoduodenal arteries. 2. Prophylactic coil embolization of the superior pancreaticoduodenal arcade. Marliss Coots, MD [facility] Radiology Electronically Signed   By: Marliss Coots M.D.   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 EXAM: 1. Ultrasound-guided vascular access of the right common femoral artery. 2. Pelvic angiogram 3. Selective catheterization and angiography of the superior mesenteric, dorsal pancreatic, posterior pancreaticoduodenal arcade, celiac, gastroduodenal, and superior pancreaticoduodenal arcade 4. Coil embolization of superior pancreaticoduodenal arcade MEDICATIONS: None. ANESTHESIA/SEDATION: Moderate (conscious) sedation was employed during this procedure. A total of Versed 4 mg and Fentanyl 150 mcg was administered intravenously. Moderate Sedation Time: 109 minutes. The patient's level of consciousness and vital signs were monitored continuously by radiology nursing throughout the procedure under my direct supervision. CONTRAST:  OMNIPAQUE IOHEXOL 300 MG/ML SOLN, 54mL OMNIPAQUE IOHEXOL 300 MG/ML SOLN, 54mL OMNIPAQUE IOHEXOL 300 MG/ML SOLN FLUOROSCOPY: Radiation  Exposure Index (as provided by the fluoroscopic device): 2,180 mGy Kerma COMPLICATIONS: None immediate. PROCEDURE: Informed consent was obtained from the patient following explanation of the procedure, risks, benefits and alternatives. The patient understands, agrees and consents for the procedure. All questions were addressed. A time out was performed prior to the initiation of the procedure. Maximal barrier sterile technique utilized including caps, mask, sterile gowns, sterile gloves, large sterile drape, hand hygiene, and Betadine prep. Preprocedure ultrasound evaluation demonstrated patency of the right common femoral artery. The procedure was planned. Subdermal Local anesthesia was provided with 1% lidocaine. A small skin nick was made. Under direct ultrasound visualization, a 21 gauge micropuncture needle was directed into the common femoral artery. A ultrasound image was captured and saved in the permanent record. A micropuncture sheath was inserted and limited right lower extremity angiogram was performed which demonstrated adequate puncture site for closure device use. There was difficulty advancing an 035 J wire and a micropuncture sheath into iliac arteries. 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 Using a Glidewire, the catheter was able to be directed to the right common iliac artery. Angiogram demonstrated severe focal stenosis in the mid right common iliac artery. Wire was able to be navigated into the distal abdominal aorta and the catheter was exchanged for an Omni flush catheter. Dedicated pelvic angiogram was performed. The left common, external, and internal iliac arteries are widely patent. There is severe focal stenosis about the proximal right common iliac artery secondary to  atherosclerotic  plaque. The internal iliac artery is patent. The external iliac artery again demonstrates focal moderate stenosis in its mid to distal segment. The common femoral arteries are patent bilaterally. Guidewire was inserted and the Omni Flush catheter was exchanged for a C2 catheter. Under fluoroscopic visualization, the superior mesenteric artery was selected. Dedicated superior mesenteric angiogram demonstrated patency of the superior mesenteric artery with a proximal branch identified is the dorsal pancreatic artery. There is no evidence of active extravasation. Of note, there is a replaced right hepatic artery arising proximal superior mesenteric artery. A 2.4 French Progreat microcatheter and fathom 14 microwire were used to select the dorsal pancreatic artery. Dedicated angiogram was performed which demonstrated no evidence of active extravasation. The pancreaticoduodenal arcade was then navigated in the catheter tip was positioned in the gastroduodenal artery via retrograde approach. Angiogram from the gastroduodenal artery demonstrated primarily opacification of the right gastroepiploic artery with no evidence of active extravasation. The microcatheter was removed. The 5 French C2 catheter was then redirected superiorly to the celiac trunk. Celiac angiogram demonstrated widely patent a left hepatic artery and splenic artery. The gastroduodenal artery is patent. Glidewire was then advanced to the gastroduodenal artery and dedicated angiogram was performed. There is no evidence of active extravasation in the region of the pancreatic head. The coaxial microcatheter system was then used to select the superior pancreaticoduodenal arcade. Angiogram was repeated which demonstrated no evidence of active extravasation and its branches. Careful study correlating this location with recent comparison CTA was done and it was deemed that this region was most likely responsible for the previously visualized hemorrhage.  Therefore, prophylactic coil embolization was performed of the superior pancreaticoduodenal arcade with several 1-2 mm detachable low-profile Ruby coils. Completion angiogram was performed from the distal gastroduodenal artery which demonstrated successful embolization of the superior pancreaticoduodenal arcade. There is preserved patency of the right gastroepiploic artery. The catheters were removed. The right groin access site was closed with a 6 French Angio-Seal device with ultrasound assistance without evidence of complication. Distal pulses were unchanged. The patient tolerated the procedure well was transferred back to the floor in good condition. IMPRESSION: 1. No evidence of active extravasation visualized after extensive angiographic evaluation of the pancreaticoduodenal arteries. 2. Prophylactic coil embolization of the superior pancreaticoduodenal arcade. Marliss Coots, MD [facility] Radiology Electronically Signed   By: Marliss Coots M.D.   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 EXAM: 1. Ultrasound-guided vascular access of the right common femoral artery. 2. Pelvic angiogram 3. Selective catheterization and angiography of the superior mesenteric, dorsal pancreatic, posterior pancreaticoduodenal arcade, celiac, gastroduodenal, and superior pancreaticoduodenal arcade 4. Coil embolization of superior pancreaticoduodenal arcade MEDICATIONS: None. ANESTHESIA/SEDATION: Moderate (conscious) sedation was employed during this procedure. A total of Versed 4 mg and Fentanyl 150 mcg was administered intravenously. Moderate Sedation Time: 109 minutes. The patient's level of consciousness and vital signs were monitored continuously by radiology nursing throughout the  procedure under my direct supervision. CONTRAST:  OMNIPAQUE IOHEXOL 300 MG/ML SOLN, 54mL OMNIPAQUE IOHEXOL 300 MG/ML SOLN, 54mL OMNIPAQUE IOHEXOL 300 MG/ML SOLN FLUOROSCOPY: Radiation Exposure Index (as provided by the fluoroscopic device): 2,180 mGy Kerma COMPLICATIONS: None immediate. PROCEDURE: Informed consent was obtained from the patient following explanation of the procedure, risks, benefits and alternatives. The patient understands, agrees and consents for the procedure. All questions  were addressed. A time out was performed prior to the initiation of the procedure. Maximal barrier sterile technique utilized including caps, mask, sterile gowns, sterile gloves, large sterile drape, hand hygiene, and Betadine prep. Preprocedure ultrasound evaluation demonstrated patency of the right common femoral artery. The procedure was planned. Subdermal Local anesthesia was provided with 1% lidocaine. A small skin nick was made. Under direct ultrasound visualization, a 21 gauge micropuncture needle was directed into the common femoral artery. A ultrasound image was captured and saved in the permanent record. A micropuncture sheath was inserted and limited right lower extremity angiogram was performed which demonstrated adequate puncture site for closure device use. There was difficulty advancing an 035 J wire and a micropuncture sheath into iliac arteries. 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 Using a Glidewire, the catheter was able to be directed to the right common iliac artery. Angiogram demonstrated severe focal stenosis in the mid right common iliac artery. Wire was able to be navigated into the distal abdominal aorta and the catheter was exchanged for an Omni flush catheter. Dedicated pelvic angiogram was  performed. The left common, external, and internal iliac arteries are widely patent. 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 The internal iliac artery is patent. The external iliac artery again demonstrates focal moderate stenosis in its mid to distal segment. The common femoral arteries are patent bilaterally. Guidewire was inserted and the Omni Flush catheter was exchanged for a C2 catheter. Under fluoroscopic visualization, the superior mesenteric artery was selected. Dedicated superior mesenteric angiogram demonstrated patency of the superior mesenteric artery with a proximal branch identified is the dorsal pancreatic artery. There is no evidence of active extravasation. Of note, there is a replaced right hepatic artery arising proximal superior mesenteric artery. A 2.4 French Progreat microcatheter and fathom 14 microwire were used to select the dorsal pancreatic artery. Dedicated angiogram was performed which demonstrated no evidence of active extravasation. The pancreaticoduodenal arcade was then navigated in the catheter tip was positioned in the gastroduodenal artery via retrograde approach. Angiogram from the gastroduodenal artery demonstrated primarily opacification of the right gastroepiploic artery with no evidence of active extravasation. The microcatheter was removed. The 5 French C2 catheter was then redirected superiorly to the celiac trunk. Celiac angiogram demonstrated widely patent a left hepatic artery and splenic artery. The gastroduodenal artery is patent. Glidewire was then advanced to the gastroduodenal artery and dedicated angiogram was performed. There is no evidence of active extravasation in the region of the pancreatic head. The coaxial microcatheter system was then used to select the superior pancreaticoduodenal arcade. Angiogram was repeated which demonstrated no evidence of active extravasation and its branches.  Careful study correlating this location with recent comparison CTA was done and it was deemed that this region was most likely responsible for the previously visualized hemorrhage. Therefore, prophylactic coil embolization was performed of the superior pancreaticoduodenal arcade with several 1-2 mm detachable low-profile Ruby coils. Completion angiogram was performed from the distal gastroduodenal artery which demonstrated successful embolization of the superior pancreaticoduodenal arcade. There is preserved patency of the right gastroepiploic artery. The catheters were removed. The right groin access site was closed with a 6 French Angio-Seal device with ultrasound assistance without evidence of complication. Distal pulses were unchanged. The patient tolerated the procedure well was  transferred back to the floor in good condition. IMPRESSION: 1. No evidence of active extravasation visualized after extensive angiographic evaluation of the pancreaticoduodenal arteries. 2. Prophylactic coil embolization of the superior pancreaticoduodenal arcade. Marliss Coots, MD [facility] Radiology Electronically Signed   By: Marliss Coots M.D.   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 EXAM: 1. Ultrasound-guided vascular access of the right common femoral artery. 2. Pelvic angiogram 3. Selective catheterization and angiography of the superior mesenteric, dorsal pancreatic, posterior pancreaticoduodenal arcade, celiac, gastroduodenal, and superior pancreaticoduodenal arcade 4. Coil embolization of superior pancreaticoduodenal arcade MEDICATIONS: None. ANESTHESIA/SEDATION: Moderate (conscious) sedation was employed during this procedure. A total of Versed 4 mg and Fentanyl 150 mcg was  administered intravenously. Moderate Sedation Time: 109 minutes. The patient's level of consciousness and vital signs were monitored continuously by radiology nursing throughout the procedure under my direct supervision. CONTRAST:  OMNIPAQUE IOHEXOL 300 MG/ML SOLN, 54mL OMNIPAQUE IOHEXOL 300 MG/ML SOLN, 54mL OMNIPAQUE IOHEXOL 300 MG/ML SOLN FLUOROSCOPY: Radiation Exposure Index (as provided by the fluoroscopic device): 2,180 mGy Kerma COMPLICATIONS: None immediate. PROCEDURE: Informed consent was obtained from the patient following explanation of the procedure, risks, benefits and alternatives. The patient understands, agrees and consents for the procedure. All questions were addressed. A time out was performed prior to the initiation of the procedure. Maximal barrier sterile technique utilized including caps, mask, sterile gowns, sterile gloves, large sterile drape, hand hygiene, and Betadine prep. Preprocedure ultrasound evaluation demonstrated patency of the right common femoral artery. The procedure was planned. Subdermal Local anesthesia was provided with 1% lidocaine. A small skin nick was made. Under direct ultrasound visualization, a 21 gauge micropuncture needle was directed into the common femoral artery. A ultrasound image was captured and saved in the permanent record. A micropuncture sheath was inserted and limited right lower extremity angiogram was performed which demonstrated adequate puncture site for closure device use. There was difficulty advancing an 035 J wire and a micropuncture sheath into iliac arteries. 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 Using a Glidewire, the catheter was able to be directed to the right common iliac artery. Angiogram demonstrated severe focal stenosis in the  mid right common iliac artery. Wire was able to be navigated into the distal abdominal aorta and the catheter was exchanged for an Omni flush catheter. Dedicated pelvic angiogram was performed. The left common, external, and internal iliac arteries are widely patent. 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 The internal iliac artery is patent. The external iliac artery again demonstrates focal moderate stenosis in its mid to distal segment. The common femoral arteries are patent bilaterally. Guidewire was inserted and the Omni Flush catheter was exchanged for a C2 catheter. Under fluoroscopic visualization, the superior mesenteric artery was selected. Dedicated superior mesenteric angiogram demonstrated patency of the superior mesenteric artery with a proximal branch identified is the dorsal pancreatic artery. There is no evidence of active extravasation. Of note, there is a replaced right hepatic artery arising proximal superior mesenteric artery. A 2.4 French Progreat microcatheter and fathom 14 microwire were used to select the dorsal pancreatic artery. Dedicated angiogram was  performed which demonstrated no evidence of active extravasation. The pancreaticoduodenal arcade was then navigated in the catheter tip was positioned in the gastroduodenal artery via retrograde approach. Angiogram from the gastroduodenal artery demonstrated primarily opacification of the right gastroepiploic artery with no evidence of active extravasation. The microcatheter was removed. The 5 French C2 catheter was then redirected superiorly to the celiac trunk. Celiac angiogram demonstrated widely patent a left hepatic artery and splenic artery. The gastroduodenal artery is patent. Glidewire was then advanced to the gastroduodenal artery and dedicated angiogram was performed. There is no evidence of active extravasation in the region of the pancreatic head. The  coaxial microcatheter system was then used to select the superior pancreaticoduodenal arcade. Angiogram was repeated which demonstrated no evidence of active extravasation and its branches. Careful study correlating this location with recent comparison CTA was done and it was deemed that this region was most likely responsible for the previously visualized hemorrhage. Therefore, prophylactic coil embolization was performed of the superior pancreaticoduodenal arcade with several 1-2 mm detachable low-profile Ruby coils. Completion angiogram was performed from the distal gastroduodenal artery which demonstrated successful embolization of the superior pancreaticoduodenal arcade. There is preserved patency of the right gastroepiploic artery. The catheters were removed. The right groin access site was closed with a 6 French Angio-Seal device with ultrasound assistance without evidence of complication. Distal pulses were unchanged. The patient tolerated the procedure well was transferred back to the floor in good condition. IMPRESSION: 1. No evidence of active extravasation visualized after extensive angiographic evaluation of the pancreaticoduodenal arteries. 2. Prophylactic coil embolization of the superior pancreaticoduodenal arcade. Marliss Coots, MD [facility] Radiology Electronically Signed   By: Marliss Coots M.D.   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 EXAM: 1. Ultrasound-guided vascular access of the right common femoral artery. 2. Pelvic angiogram 3. Selective catheterization and angiography of the superior mesenteric, dorsal pancreatic, posterior pancreaticoduodenal arcade, celiac, gastroduodenal, and superior pancreaticoduodenal arcade 4. Coil embolization of superior  pancreaticoduodenal arcade MEDICATIONS: None. ANESTHESIA/SEDATION: Moderate (conscious) sedation was employed during this procedure. A total of Versed 4 mg and Fentanyl 150 mcg was administered intravenously. Moderate Sedation Time: 109 minutes. The patient's level of consciousness and vital signs were monitored continuously by radiology nursing throughout the procedure under my direct supervision. CONTRAST:  OMNIPAQUE IOHEXOL 300 MG/ML SOLN, 54mL OMNIPAQUE IOHEXOL 300 MG/ML SOLN, 54mL OMNIPAQUE IOHEXOL 300 MG/ML SOLN FLUOROSCOPY: Radiation Exposure Index (as provided by the fluoroscopic device): 2,180 mGy Kerma COMPLICATIONS: None immediate. PROCEDURE: Informed consent was obtained from the patient following explanation of the procedure, risks, benefits and alternatives. The patient understands, agrees and consents for the procedure. All questions were addressed. A time out was performed prior to the initiation of the procedure. Maximal barrier sterile technique utilized including caps, mask, sterile gowns, sterile gloves, large sterile drape, hand hygiene, and Betadine prep. Preprocedure ultrasound evaluation demonstrated patency of the right common femoral artery. The procedure was planned. Subdermal Local anesthesia was provided with 1% lidocaine. A small skin nick was made. Under direct ultrasound visualization, a 21 gauge micropuncture needle was directed into the common femoral artery. A ultrasound image was captured and saved in the permanent record. A micropuncture sheath was inserted and limited right lower extremity angiogram was performed which demonstrated adequate puncture site for closure device use. There was difficulty advancing  an 035 J wire and a micropuncture sheath into iliac arteries. Micropuncture sheath was exchanged for a 5 French angled tip catheter and angiography of the right iliac vessels was performed which demonstrated severe stenosis in the mid right external iliac artery,  likely secondary to atherosclerotic plaque. Using a Glidewire, the catheter was able to be directed to the right common iliac artery. Angiogram demonstrated severe focal stenosis in the mid right common iliac artery. Wire was able to be navigated into the distal abdominal aorta and the catheter was exchanged for an Omni flush catheter. Dedicated pelvic angiogram was performed. The left common, external, and internal iliac arteries are widely patent. 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 The internal iliac artery is patent. The external iliac artery again demonstrates focal moderate stenosis in its mid to distal segment. The common femoral arteries are patent bilaterally. Guidewire was inserted and the Omni Flush catheter was exchanged for a C2 catheter. Under fluoroscopic visualization, the superior mesenteric artery was selected. Dedicated superior mesenteric angiogram demonstrated patency of the superior mesenteric artery with a proximal branch identified is the dorsal pancreatic artery. There is no evidence of active extravasation. Of note, there is a replaced right hepatic artery arising proximal superior mesenteric artery. A 2.4 French Progreat microcatheter and fathom 14 microwire were used to select the dorsal pancreatic artery. Dedicated angiogram was performed which demonstrated no evidence of active extravasation. The pancreaticoduodenal arcade was then navigated in the catheter tip was positioned in the gastroduodenal artery via retrograde approach. Angiogram from the gastroduodenal artery demonstrated primarily opacification of the right gastroepiploic artery with no evidence of active extravasation. The microcatheter was removed. The 5 French C2 catheter was then redirected superiorly to the celiac trunk. Celiac angiogram demonstrated widely patent a left hepatic artery and splenic artery. The gastroduodenal artery is patent.  Glidewire was then advanced to the gastroduodenal artery and dedicated angiogram was performed. There is no evidence of active extravasation in the region of the pancreatic head. The coaxial microcatheter system was then used to select the superior pancreaticoduodenal arcade. Angiogram was repeated which demonstrated no evidence of active extravasation and its branches. Careful study correlating this location with recent comparison CTA was done and it was deemed that this region was most likely responsible for the previously visualized hemorrhage. Therefore, prophylactic coil embolization was performed of the superior pancreaticoduodenal arcade with several 1-2 mm detachable low-profile Ruby coils. Completion angiogram was performed from the distal gastroduodenal artery which demonstrated successful embolization of the superior pancreaticoduodenal arcade. There is preserved patency of the right gastroepiploic artery. The catheters were removed. The right groin access site was closed with a 6 French Angio-Seal device with ultrasound assistance without evidence of complication. Distal pulses were unchanged. The patient tolerated the procedure well was transferred back to the floor in good condition. IMPRESSION: 1. No evidence of active extravasation visualized after extensive angiographic evaluation of the pancreaticoduodenal arteries. 2. Prophylactic coil embolization of the superior pancreaticoduodenal arcade. Marliss Coots, MD [facility] Radiology Electronically Signed   By: Marliss Coots M.D.   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 EXAM: 1. Ultrasound-guided vascular access of the right common femoral artery. 2. Pelvic angiogram 3. Selective catheterization  and angiography of the superior mesenteric,  dorsal pancreatic, posterior pancreaticoduodenal arcade, celiac, gastroduodenal, and superior pancreaticoduodenal arcade 4. Coil embolization of superior pancreaticoduodenal arcade MEDICATIONS: None. ANESTHESIA/SEDATION: Moderate (conscious) sedation was employed during this procedure. A total of Versed 4 mg and Fentanyl 150 mcg was administered intravenously. Moderate Sedation Time: 109 minutes. The patient's level of consciousness and vital signs were monitored continuously by radiology nursing throughout the procedure under my direct supervision. CONTRAST:  OMNIPAQUE IOHEXOL 300 MG/ML SOLN, 54mL OMNIPAQUE IOHEXOL 300 MG/ML SOLN, 54mL OMNIPAQUE IOHEXOL 300 MG/ML SOLN FLUOROSCOPY: Radiation Exposure Index (as provided by the fluoroscopic device): 2,180 mGy Kerma COMPLICATIONS: None immediate. PROCEDURE: Informed consent was obtained from the patient following explanation of the procedure, risks, benefits and alternatives. The patient understands, agrees and consents for the procedure. All questions were addressed. A time out was performed prior to the initiation of the procedure. Maximal barrier sterile technique utilized including caps, mask, sterile gowns, sterile gloves, large sterile drape, hand hygiene, and Betadine prep. Preprocedure ultrasound evaluation demonstrated patency of the right common femoral artery. The procedure was planned. Subdermal Local anesthesia was provided with 1% lidocaine. A small skin nick was made. Under direct ultrasound visualization, a 21 gauge micropuncture needle was directed into the common femoral artery. A ultrasound image was captured and saved in the permanent record. A micropuncture sheath was inserted and limited right lower extremity angiogram was performed which demonstrated adequate puncture site for closure device use. There was difficulty advancing an 035 J wire and a micropuncture sheath into iliac arteries.  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 Using a Glidewire, the catheter was able to be directed to the right common iliac artery. Angiogram demonstrated severe focal stenosis in the mid right common iliac artery. Wire was able to be navigated into the distal abdominal aorta and the catheter was exchanged for an Omni flush catheter. Dedicated pelvic angiogram was performed. The left common, external, and internal iliac arteries are widely patent. 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 The internal iliac artery is patent. The external iliac artery again demonstrates focal moderate stenosis in its mid to distal segment. The common femoral arteries are patent bilaterally. Guidewire was inserted and the Omni Flush catheter was exchanged for a C2 catheter. Under fluoroscopic visualization, the superior mesenteric artery was selected. Dedicated superior mesenteric angiogram demonstrated patency of the superior mesenteric artery with a proximal branch identified is the dorsal pancreatic artery. There is no evidence of active extravasation. Of note, there is a replaced right hepatic artery arising proximal superior mesenteric artery. A 2.4 French Progreat microcatheter and fathom 14 microwire were used to select the dorsal pancreatic artery. Dedicated angiogram was performed which demonstrated no evidence of active extravasation. The pancreaticoduodenal arcade was then navigated in the catheter tip was positioned in the gastroduodenal artery via retrograde approach. Angiogram from the gastroduodenal artery demonstrated primarily opacification of the right gastroepiploic artery with no evidence of active extravasation. The microcatheter was removed.  The 5 French C2 catheter was then redirected superiorly to the celiac trunk. Celiac angiogram demonstrated widely patent a left hepatic artery and splenic artery. The gastroduodenal artery is patent. Glidewire was then advanced to the gastroduodenal artery and dedicated angiogram was performed. There is no evidence of active extravasation in the region of the pancreatic head. The coaxial  microcatheter system was then used to select the superior pancreaticoduodenal arcade. Angiogram was repeated which demonstrated no evidence of active extravasation and its branches. Careful study correlating this location with recent comparison CTA was done and it was deemed that this region was most likely responsible for the previously visualized hemorrhage. Therefore, prophylactic coil embolization was performed of the superior pancreaticoduodenal arcade with several 1-2 mm detachable low-profile Ruby coils. Completion angiogram was performed from the distal gastroduodenal artery which demonstrated successful embolization of the superior pancreaticoduodenal arcade. There is preserved patency of the right gastroepiploic artery. The catheters were removed. The right groin access site was closed with a 6 French Angio-Seal device with ultrasound assistance without evidence of complication. Distal pulses were unchanged. The patient tolerated the procedure well was transferred back to the floor in good condition. IMPRESSION: 1. No evidence of active extravasation visualized after extensive angiographic evaluation of the pancreaticoduodenal arteries. 2. Prophylactic coil embolization of the superior pancreaticoduodenal arcade. Marliss Coots, MD [facility] Radiology Electronically Signed   By: Marliss Coots M.D.   On: 09/19/2022 12:55   CT Angio Abd/Pel w/ and/or w/o  Result Date: 09/18/2022 CLINICAL DATA:  Pancreatitis and probable pseudoaneurysm of pancreaticoduodenal arcade by prior  imaging. CTA evaluation to reassess pseudoaneurysm and for planning purposes prior to possible arteriography and embolization. EXAM: CT ANGIOGRAPHY ABDOMEN AND PELVIS WITH CONTRAST AND WITHOUT CONTRAST TECHNIQUE: Multidetector CT imaging of the abdomen and pelvis was performed using the standard protocol during bolus administration of intravenous contrast. Multiplanar reconstructed images and MIPs were obtained and reviewed to evaluate the vascular anatomy. RADIATION DOSE REDUCTION: This exam was performed according to the departmental dose-optimization program which includes automated exposure control, adjustment of the mA and/or kV according to patient size and/or use of iterative reconstruction technique. CONTRAST:  OMNIPAQUE IOHEXOL 350 MG/ML SOLN COMPARISON:  CT of the abdomen and pelvis on 09/15/2022 and MRI of the abdomen on 09/16/2022. FINDINGS: VASCULAR Aorta: Diffuse atherosclerosis of the abdominal aorta without aneurysm or dissection. Celiac: Normally patent.  Normally patent branch vessels. SMA: Atherosclerosis of proximal SMA without significant stenosis. Replaced right hepatic artery. 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 This pseudoaneurysm has clearly enlarged since the prior imaging. Exact supply to the pseudoaneurysm is difficult to delineate by current CTA but the distribution would suggest supply from the inferior pancreaticoduodenal arcade supplied by the superior mesenteric artery. Renals: Bilateral single renal arteries. Proximal atherosclerosis bilaterally without significant stenosis on the right and with stenosis likely approaching 50-60% on the left. IMA: Normally patent. Inflow: Diffuse calcified plaque throughout bilateral  common and internal iliac arteries without high-grade stenosis. External iliac arteries demonstrate milder disease without high-grade stenosis. Proximal Outflow: Atherosclerosis of bilateral common femoral arteries without high-grade stenosis. Bilateral femoral bifurcations are patent. 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 There is compensatory significant enlargement of the inferior mesenteric vein that empties into the splenic vein prior to its confluence with the portal vein and also collateral veins communicating with the inferior mesenteric vein including a large left-sided collateral vein communicating with mesenteric veins. Review of the  MIP images confirms the above findings. NON-VASCULAR Lower chest: No acute abnormality. Hepatobiliary: Status post cholecystectomy. No focal liver lesions. Again noted is both intrahepatic and extrahepatic biliary ductal dilatation converging into the area of pancreatitis and felt to likely be on the basis of distal CBD stricture as noted by MRI. This may be a benign stricture due to inflammation. Pancreas: Geographic area of inflammation and probable associated hemorrhage centered along the posterior aspect of the pancreatic head shows no significant change since the prior CT of 09/15/2022 measuring roughly 4.5 cm in diameter. No change in distal ductal dilatation within the body and tail of the pancreas measuring up to 5 mm. No discrete pseudocyst development or overt pancreatic necrosis. No evidence of focal pancreatic abscess. Spleen: Normal in size without focal abnormality. Adrenals/Urinary Tract: Adrenal glands are unremarkable. Kidneys are normal, without renal calculi, focal lesion, or hydronephrosis. Bladder is unremarkable. Stomach/Bowel: No bowel  obstruction or significant ileus. 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 Lymphatic: No enlarged lymph nodes identified. Reproductive: Prostate is unremarkable. Other: No hernias or ascites. Musculoskeletal: Stable degenerative disc disease of the lumbar spine with associated mild rightward convex scoliosis. IMPRESSION: 1. 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 Exact supply to the pseudoaneurysm is difficult to delineate by current CTA but the distribution would suggest supply from the inferior pancreaticoduodenal arcade supplied by the superior mesenteric artery. 2. 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 There is compensatory enlargement of the inferior mesenteric vein that empties into the splenic vein prior to its confluence with the portal vein and also collateral veins communicating with the inferior mesenteric vein including a large left-sided collateral vein communicating with mesenteric veins. 3. No significant change in geographic area of inflammation and probable associated hemorrhage centered along the posterior aspect of the pancreatic head measuring roughly 4.5 cm in diameter. No change in distal ductal dilatation within the body and tail of the pancreas measuring up to 5 mm. No discrete pseudocyst development or overt pancreatic necrosis. 4. Intrahepatic and extrahepatic biliary ductal dilatation converging into the area of pancreatitis and felt to likely be on the basis of distal CBD stricture as noted by MRI. This may be a benign stricture due to inflammation. 5. Aortoiliac atherosclerosis without aneurysm.  Electronically Signed   By: Irish Lack M.D.   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 was a complex masslike area of the pancreatic head measuring 4.4 x 4.5 cm and along the posterior aspect of this there was a 12 mm area concerning for visceral aneurysm  CT angiography confirmed this to be an 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 Marina Goodell and after he has completely healed from the pseudoaneurysm can pursue any other management of the CBD stricture/pancreatic duct stricture as an outpatient. Will arrange for outpatient appointment with Dr. Marina Goodell Obviously he needs to stay abstinent from EtOH  Principal Problem:    Pancreatitis     LOS: 5 days   Amy EsterwoodPA-C  09/20/2022, 10:47 AM  GI ATTENDING  Interval history data reviewed.  Patient seen and examined.  Agree with comprehensive interval progress note as outlined above.  The patient is stable.  No further plans from GI perspective at this time.  I told him to avoid all alcohol indefinitely.  We will arrange outpatient GI follow-up.  Timing of discharge for this admission per IR recommendation after embolization.  We are available if needed.  Will sign off.  Wilhemina Bonito. Eda Keys., M.D. [facility] Division of Gastroenterology

## 2022-09-20 NOTE — Discharge Summary (Signed)
Physician Discharge Summary  Justin Suarez ZOX:[id number] DOB: 1955-07-30 DOA: 09/14/2022  PCP: [name], MD  Admit date: 09/14/2022 Discharge date: 09/20/2022  Admitted From: Home Discharge disposition: Home  Recommendations at discharge:  Stop alcohol.  Avoid drugs Stop Xarelto  Brief narrative: Justin Suarez is a 67 y.o. 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 Charlann Boxer. 7/25, patient presented to the ED with complaint of severe epigastric pain, nausea, vomiting.   Drinks about 2-3 beers a day.  Last alcohol use was the day prior.  In the ED, patient was afebrile, initial blood pressure was elevated to 222/97. Initial labs showed WC count of 11.3, hemoglobin 12.2, renal function normal, lipase elevated to 556, AST/ALT/alk phos elevated to 63/96/294. CT abdomen pelvis showed a masslike enlargement of the pancreatic head similar to prior CT and MRI from 2022-findings likely representing acute pancreatitis but unable to exclude a mass.  Calcifications in the pancreas compatible with chronic pancreatitis.  Stable intrahepatic extremity biliary duct dilatation likely due to postcholecystectomy status  Patient required multiple rounds of IV opiates Started on IV fluids, IV analgesics, IV antiemetics Admitted to [facility]  Subjective: Patient was seen and examined this morning.   Sitting up in bed.  Not in distress.  Abdominal pain improved.  Able to tolerate DIET.  Hemoglobin stable  Hospital course: Acute on chronic alcoholic pancreatitis Suspect acute cholangitis Complex pancreatic mass Elevated liver enzymes Diffuse hepatic steatosis Presented with severe epigastric pain, nausea, vomiting  Lipase was elevated over 500.   CT abdomen with findings of acute pancreatitis.   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 Started on conservative management with IV fluid, IV analgesics, IV antiemetics Abdominal pain improved.  Lipase level improved. Able to tolerate regular consistency diet.  Completed 5-day course of IV Zosyn. Recent Labs  Lab 09/16/22 0459 09/17/22 0416 09/18/22 0337 09/19/22 0934 09/20/22 0421  LIPASE 96* 49 39 40 52*    Recent Labs  Lab 09/16/22 0459 09/17/22 0416 09/18/22 0337 09/19/22 0407 09/19/22 0934 09/20/22 0421  AST 306* 137* 53*  --  30 44*  ALT 218* 155* 108*  --  76* 61*  ALKPHOS 346* 342* 302*  --  269* 251*  BILITOT 3.1* 2.1* 1.0  --  0.8 0.8  PROT 5.9* 5.9* 6.1*  --  6.4* 5.8*  ALBUMIN 2.9* 2.9* 2.8*  --  2.9* 2.6*  INR  --   --   --  1.1  --   --   LIPASE 96* 49 39  --  40 52*  PLT 198 191 219 257  --  246   Pseudoaneurysm of the pancreaticoduodenal arcade MRCP 7/27 raised concern of a pseudoaneurysm of the pancreaticoduodenal arcade.   IR consult was obtained..   7/29, CT angiography showed a geographic area of inflammation and probable associated hemorrhage along the posterior aspect of the pancreatic head measuring 4.5 cm, no change in distal duct dilation, no overt pancreatic necrosis.  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  Patient was monitored and hemoglobin repeated for 24 hours.  Symptoms improved.  Hemoglobin stable.  H/o DVT/PE 2010 Recent left hip fracture s/p IM nailing 7/18 -Dr. Charlann Boxer. 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  He completed 6 months course of anticoagulation at that time. 08/28/2022, patient underwent hip surgery and since then he has been on Xarelto for  DVT prophylaxis Given pseudoaneurysm, will keep him off Xarelto  Chronic alcoholism Impending withdrawal Patient was drinking about 2-3 beers a day probably putting it politely.  Last use was the day prior to admission Did not have any withdrawal symptoms in the hospital.  Hypertensive urgency Initial blood pressure was significant elevated over 200 and remained elevated for several hours 170s this morning Blood pressure is now in normal range with metoprolol.  Continue same as before   Elevated troponin Aortic atherosclerosis Troponin was mildly elevated in the setting of significant CAD risk factors including HTN, smoking  Denies any anginal symptoms. Likely demand ischemia in the setting of pain and hypertensive urgency Echocardiogram with normal EF, mild LVH and no wall motion abnormality  Current smoker Counseled to quit. Nicotine patch offered   Osteoarthritis Recent left hip fracture s/p IM nailing 7/18 -Dr. Charlann Boxer. Would avoid NSAIDs given high risk of GI bleeding in the setting of alcohol use Continue oxycodone as needed, bowel regimen   Mobility: Encourage ambulation  Goals of care   Code Status: Full Code   Wounds:  - Incision - 4 Ports Abdomen Right;Lateral Right;Medial Mid;Medial Mid;Upper (Active)  Placement Date/Time: 05/24/20 1325   Location of Ports: Abdomen  Location Orientation: Right;Lateral  Location Orientation: Right;Medial  Location Orientation: Mid;Medial  Location Orientation: Mid;Upper    Assessments 05/24/2020  2:12 PM 05/25/2020  8:00 AM  Port 1 Dressing Type Adhesive strips;Gauze (Comment);Transparent dressing Gauze (Comment)  Port 1 Dressing Status -- Clean;Dry;Intact  Port 2 Dressing Type Adhesive strips;Gauze (Comment);Transparent dressing Gauze (Comment)  Port 2 Dressing Status -- Clean;Dry;Intact  Port 3 Dressing Type Adhesive strips;Gauze (Comment);Transparent dressing Gauze (Comment)  Port 3 Dressing Status -- Clean;Dry;Intact  Port 4  Dressing Type Adhesive strips;Gauze (Comment);Transparent dressing Gauze (Comment)  Port 4 Dressing Status -- Clean;Dry;Intact     No associated orders.     Incision (Closed) 09/15/22 Thigh Anterior;Left (Active)  Date First Assessed/Time First Assessed: 09/15/22 (c) 0800   Location: Thigh  Location Orientation: Anterior;Left  Present on Admission: Yes    Assessments 09/15/2022  8:00 AM 09/20/2022 10:00 AM  Dressing Type Silicone dressing Silver hydrofiber  Dressing Old drainage (marked) Old drainage (marked)  Site / Wound Assessment Dressing in place / Unable to assess Dressing in place / Unable to assess  Closure Adhesive strips --     No associated orders.    Discharge Exam:   Vitals:   09/19/22 1041 09/19/22 1220 09/19/22 2036 09/20/22 0513  BP:  138/72 125/64 125/62  Pulse:  61 67 (!) 59  Resp: 17 16 19 19   Temp:  97.7 F (36.5 C) 98.7 F (37.1 C) 98 F (36.7 C)  TempSrc:  Oral Oral Oral  SpO2:  100% 99% 99%  Weight:      Height:        Body mass index is 22.02 kg/m.   General exam: Pleasant, elderly Caucasian male.  Not in pain Skin: No rashes, lesions or ulcers. HEENT: Atraumatic, normocephalic, no obvious bleeding Lungs: Clear to auscultation bilaterally CVS: Regular rate and rhythm, no murmur GI/Abd soft, nontender epigastrium, bowel sound present  CNS: Alert, awake,  oriented x 3 Psychiatry: Mood appropriate Extremities: No edema, no calf tenderness  Follow ups:    Follow-up Information     [facility] Follow up.   Specialty: Home Health Services Why: will follow up with you 24 to 48hrs after discharge Contact information: [address] [place] [address] [phone]         [name], MD Follow up.   Specialty: Family Medicine Contact information: [address] [place] [address] [phone]                 Discharge Instructions:   Discharge Instructions     Call MD for:   difficulty breathing, headache or visual disturbances   Complete by: As directed    Call MD for:  extreme fatigue   Complete by: As directed    Call MD for:  hives   Complete by: As directed    Call MD for:  persistant dizziness or light-headedness   Complete by: As directed    Call MD for:  persistant nausea and vomiting   Complete by: As directed    Call MD for:  severe uncontrolled pain   Complete by: As directed    Call MD for:  temperature >100.4   Complete by: As directed    Diet general   Complete by: As directed    Discharge instructions   Complete by: As directed    Recommendations at discharge:   Stop alcohol.  Avoid drugs  Stop Xarelto  General discharge instructions: Follow with Primary MD Cox, Fritzi Mandes, MD in 7 days  Please request your PCP  to go over your hospital tests, procedures, radiology results at the follow up. Please get your medicines reviewed and adjusted.  Your PCP may decide to repeat certain labs or tests as needed. Do not drive, operate heavy machinery, perform activities at heights, swimming or participation in water activities or provide baby sitting services if your were admitted for syncope or siezures until you have seen by Primary MD or a Neurologist and advised to do so again. [place] Controlled Substance Reporting System database was reviewed. Do not drive, operate heavy machinery, perform activities at heights, swim, participate in water activities or provide baby-sitting services while on medications for pain, sleep and mood until your outpatient physician has reevaluated you and advised to do so again.  You are strongly recommended to comply with the dose, frequency and duration of prescribed medications. Activity: As tolerated with Full fall precautions use walker/cane & assistance as needed Avoid using any recreational substances like cigarette, tobacco, alcohol, or non-prescribed drug. If you experience worsening of your admission  symptoms, develop shortness of breath, life threatening emergency, suicidal or homicidal thoughts you must seek medical attention immediately by calling 911 or calling your MD immediately  if symptoms less severe. You must read complete instructions/literature along with all the possible adverse reactions/side effects for all the medicines you take and that have been prescribed to you. Take any new medicine only after you have completely understood and accepted all the possible adverse reactions/side effects.  Wear Seat belts while driving. You were cared for by a hospitalist during your hospital stay. If you have any questions about your discharge medications or the care you received while you were in the hospital after you are discharged, you can call the unit and ask to speak with the hospitalist or the covering physician. Once you are discharged, your primary care physician will  handle any further medical issues. Please note that NO REFILLS for any discharge medications will be authorized once you are discharged, as it is imperative that you return to your primary care physician (or establish a relationship with a primary care physician if you do not have one).   Discharge wound care:   Complete by: As directed    Increase activity slowly   Complete by: As directed        Discharge Medications:   Allergies as of 09/20/2022   No Known Allergies      Medication List     STOP taking these medications    rivaroxaban 10 MG Tabs tablet Commonly known as: XARELTO       TAKE these medications    methocarbamol 500 MG tablet Commonly known as: ROBAXIN Take 1 tablet (500 mg total) by mouth every 6 (six) hours as needed for muscle spasms.   metoprolol tartrate 25 MG tablet Commonly known as: LOPRESSOR Take 0.5 tablets (12.5 mg total) by mouth 2 (two) times daily.   oxyCODONE 5 MG immediate release tablet Commonly known as: Oxy IR/ROXICODONE Take 1 tablet (5 mg total) by mouth every 4  (four) hours as needed for severe pain.   polyethylene glycol 17 g packet Commonly known as: MIRALAX / GLYCOLAX Take 17 g by mouth 2 (two) times daily.   senna-docusate 8.6-50 MG tablet Commonly known as: Senokot-S Take 2 tablets by mouth at bedtime as needed for up to 14 days for mild constipation.               Discharge Care Instructions  (From admission, onward)           Start     Ordered   09/20/22 0000  Discharge wound care:        09/20/22 1334             The results of significant diagnostics from this hospitalization (including imaging, microbiology, ancillary and laboratory) are listed below for reference.    Procedures and Diagnostic Studies:   ECHOCARDIOGRAM COMPLETE  Result Date: 09/15/2022    ECHOCARDIOGRAM REPORT   Patient Name:   Justin Suarez Date of Exam: 09/15/2022 Medical Rec #:  [id number]        Height:       71.0 in Accession #:    [id number]       Weight:       160.5 lb Date of Birth:  [birth year]        BSA:          1.920 m Patient Age:    66 years         BP:           159/69 mmHg Patient Gender: M                HR:           61 bpm. Exam Location:  Inpatient Procedure: 2D Echo, Cardiac Doppler and Color Doppler Indications:    Abnormal EKG  History:        Patient has prior history of Echocardiogram examinations, most                 recent 03/22/2011. COPD; Risk Factors:Hypertension, Dyslipidemia,                 Current Smoker and hx DVT, hx PE, alcohol abuse.  Sonographer:    Wallie Char Referring Phys: [id number] [facility]  Sonographer Comments: Image acquisition  challenging due to COPD. IMPRESSIONS  1. Left ventricular ejection fraction, by estimation, is 55 to 60%. The left ventricle has normal function. The left ventricle has no regional wall motion abnormalities. There is mild left ventricular hypertrophy. Left ventricular diastolic parameters were normal.  2. Right ventricular systolic function is normal. The right ventricular size  is normal.  3. Trivial mitral valve regurgitation.  4. The aortic valve is tricuspid. Aortic valve regurgitation is not visualized. FINDINGS  Left Ventricle: Left ventricular ejection fraction, by estimation, is 55 to 60%. The left ventricle has normal function. The left ventricle has no regional wall motion abnormalities. The left ventricular internal cavity size was normal in size. There is  mild left ventricular hypertrophy. Left ventricular diastolic parameters were normal. Right Ventricle: The right ventricular size is normal. Right vetricular wall thickness was not assessed. Right ventricular systolic function is normal. Left Atrium: Left atrial size was normal in size. Right Atrium: Right atrial size was normal in size. Pericardium: There is no evidence of pericardial effusion. Mitral Valve: Mild mitral annular calcification. Trivial mitral valve regurgitation. MV peak gradient, 4.2 mmHg. The mean mitral valve gradient is 2.0 mmHg. Tricuspid Valve: The tricuspid valve is normal in structure. Tricuspid valve regurgitation is trivial. Aortic Valve: The aortic valve is tricuspid. Aortic valve regurgitation is not visualized. Aortic valve mean gradient measures 3.0 mmHg. Aortic valve peak gradient measures 6.0 mmHg. Aortic valve area, by VTI measures 3.40 cm. Pulmonic Valve: The pulmonic valve was not well visualized. Pulmonic valve regurgitation is not visualized. Aorta: The aortic root is normal in size and structure. IAS/Shunts: No atrial level shunt detected by color flow Doppler.  LEFT VENTRICLE PLAX 2D LVIDd:         4.90 cm     Diastology LVIDs:         3.60 cm     LV e' medial:    7.35 cm/s LV PW:         0.90 cm     LV E/e' medial:  11.0 LV IVS:        1.20 cm     LV e' lateral:   9.75 cm/s LVOT diam:     2.30 cm     LV E/e' lateral: 8.3 LV SV:         92 LV SV Index:   48 LVOT Area:     4.15 cm  LV Volumes (MOD) LV vol d, MOD A2C: 72.6 ml LV vol d, MOD A4C: 97.6 ml LV vol s, MOD A2C: 31.4 ml LV vol s,  MOD A4C: 40.9 ml LV SV MOD A2C:     41.2 ml LV SV MOD A4C:     97.6 ml LV SV MOD BP:      50.8 ml RIGHT VENTRICLE             IVC RV Basal diam:  3.40 cm     IVC diam: 2.70 cm RV S prime:     12.80 cm/s TAPSE (M-mode): 2.7 cm LEFT ATRIUM             Index        RIGHT ATRIUM           Index LA diam:        3.80 cm 1.98 cm/m   RA Area:     15.70 cm LA Vol (A2C):   45.0 ml 23.43 ml/m  RA Volume:   39.10 ml  20.36 ml/m LA Vol (A4C):  45.5 ml 23.69 ml/m LA Biplane Vol: 46.6 ml 24.27 ml/m  AORTIC VALVE AV Area (Vmax):    3.47 cm AV Area (Vmean):   3.29 cm AV Area (VTI):     3.40 cm AV Vmax:           122.00 cm/s AV Vmean:          85.400 cm/s AV VTI:            0.272 m AV Peak Grad:      6.0 mmHg AV Mean Grad:      3.0 mmHg LVOT Vmax:         102.00 cm/s LVOT Vmean:        67.600 cm/s LVOT VTI:          0.222 m LVOT/AV VTI ratio: 0.82  AORTA Ao Root diam: 3.80 cm Ao Asc diam:  3.60 cm MITRAL VALVE MV Area (PHT): 2.66 cm    SHUNTS MV Area VTI:   3.03 cm    Systemic VTI:  0.22 m MV Peak grad:  4.2 mmHg    Systemic Diam: 2.30 cm MV Mean grad:  2.0 mmHg MV Vmax:       1.02 m/s MV Vmean:      57.1 cm/s MV Decel Time: 285 msec MV E velocity: 81.00 cm/s MV A velocity: 83.30 cm/s MV E/A ratio:  0.97 Dietrich Pates MD Electronically signed by Dietrich Pates MD Signature Date/Time: 09/15/2022/4:52:01 PM    Final    CT ABDOMEN PELVIS W CONTRAST  Result Date: 09/15/2022 CLINICAL DATA:  Right upper quadrant pain, epigastric pain, pancreatitis EXAM: CT ABDOMEN AND PELVIS WITH CONTRAST TECHNIQUE: Multidetector CT imaging of the abdomen and pelvis was performed using the standard protocol following bolus administration of intravenous contrast. RADIATION DOSE REDUCTION: This exam was performed according to the departmental dose-optimization program which includes automated exposure control, adjustment of the mA and/or kV according to patient size and/or use of iterative reconstruction technique. CONTRAST:  OMNIPAQUE IOHEXOL  300 MG/ML  SOLN COMPARISON:  11/18/2020 FINDINGS: Lower chest: No acute abnormality. Hepatobiliary: Prior cholecystectomy. Mild intrahepatic and extrahepatic biliary ductal dilatation. Common bile duct measures up to 13 mm. Findings are stable since prior study. Pancreas: Enlarged masslike appearance of the pancreatic head, similar to prior studies. There is pancreatic ductal dilatation. Fluid/stranding around the pancreatic head and uncinate process. Calcifications scattered throughout the pancreatic body, head and uncinate process. Spleen: No focal abnormality.  Normal size. Adrenals/Urinary Tract: No adrenal abnormality. No focal renal abnormality. No stones or hydronephrosis. Urinary bladder is unremarkable. Stomach/Bowel: Normal appendix. Stomach, large and small bowel grossly unremarkable. Vascular/Lymphatic: Heavily calcified aorta and iliac vessels. No evidence of aneurysm or adenopathy. Reproductive: No visible focal abnormality. Other: No free fluid or free air. Musculoskeletal: No acute bony abnormality. IMPRESSION: Masslike enlargement of the pancreatic head is similar to prior CT and MRI. This may reflect changes of acute pancreatitis, but underlying mass lesion difficult to exclude. Recommend follow-up CT or MRI after acute symptoms resolve. Calcifications in the pancreas compatible with chronic pancreatitis. Intrahepatic and extrahepatic biliary ductal dilatation are stable since prior study and likely related to post cholecystectomy state Aortic atherosclerosis. Electronically Signed   By: Charlett Nose M.D.   On: 09/15/2022 02:36     Labs:   Basic Metabolic Panel: Recent Labs  Lab 09/15/22 0609 09/16/22 0459 09/17/22 0416 09/18/22 0337 09/19/22 0934 09/20/22 0421  NA 132* 132* 129* 134* 131* 134*  K 4.4 3.8 4.0 3.2* 4.3  3.9  CL 98 99 95* 100 97* 101  CO2 21* 25 26 26 26 25   GLUCOSE 124* 95 109* 108* 113* 155*  BUN 9 7* 8 7* 6* 7*  CREATININE 0.48* 0.63 0.71 0.66 0.70 0.90   CALCIUM 8.2* 8.4* 8.3* 8.3* 8.6* 8.2*  MG 1.5*  --   --   --   --   --   PHOS 3.6 3.8  --   --   --   --    GFR Estimated Creatinine Clearance: 80.7 mL/min (by C-G formula based on SCr of 0.9 mg/dL). Liver Function Tests: Recent Labs  Lab 09/16/22 0459 09/17/22 0416 09/18/22 0337 09/19/22 0934 09/20/22 0421  AST 306* 137* 53* 30 44*  ALT 218* 155* 108* 76* 61*  ALKPHOS 346* 342* 302* 269* 251*  BILITOT 3.1* 2.1* 1.0 0.8 0.8  PROT 5.9* 5.9* 6.1* 6.4* 5.8*  ALBUMIN 2.9* 2.9* 2.8* 2.9* 2.6*   Recent Labs  Lab 09/16/22 0459 09/17/22 0416 09/18/22 0337 09/19/22 0934 09/20/22 0421  LIPASE 96* 49 39 40 52*   No results for input(s): "AMMONIA" in the last 168 hours. Coagulation profile Recent Labs  Lab 09/19/22 0407  INR 1.1    CBC: Recent Labs  Lab 09/14/22 2235 09/15/22 0609 09/16/22 0459 09/17/22 0416 09/18/22 0337 09/19/22 0407 09/19/22 1114 09/19/22 1704 09/19/22 2302 09/20/22 0421 09/20/22 1032  WBC 11.3*   < > 5.1 5.0 4.6 4.9  --   --   --  5.5  --   NEUTROABS 9.4*  --  3.1 3.2 2.9  --   --   --   --  3.4  --   HGB 12.2*   < > 9.8* 9.6* 9.8* 9.9* 10.4* 9.8* 9.6* 9.1* 10.0*  HCT 36.2*   < > 30.1* 28.8* 29.8* 29.8* 31.6* 30.8* 29.2* 27.6* 31.1*  MCV 93.1   < > 95.9 95.0 97.4 96.4  --   --   --  97.5  --   PLT 335   < > 198 191 219 257  --   --   --  246  --    < > = values in this interval not displayed.   Cardiac Enzymes: No results for input(s): "CKTOTAL", "CKMB", "CKMBINDEX", "TROPONINI" in the last 168 hours. BNP: Invalid input(s): "POCBNP" CBG: No results for input(s): "GLUCAP" in the last 168 hours. D-Dimer No results for input(s): "DDIMER" in the last 72 hours. Hgb A1c No results for input(s): "HGBA1C" in the last 72 hours. Lipid Profile No results for input(s): "CHOL", "HDL", "LDLCALC", "TRIG", "CHOLHDL", "LDLDIRECT" in the last 72 hours. Thyroid function studies No results for input(s): "TSH", "T4TOTAL", "T3FREE", "THYROIDAB" in the  last 72 hours.  Invalid input(s): "FREET3" Anemia work up No results for input(s): "VITAMINB12", "FOLATE", "FERRITIN", "TIBC", "IRON", "RETICCTPCT" in the last 72 hours. Microbiology Recent Results (from the past 240 hour(s))  Culture, blood (Routine X 2) w Reflex to ID Panel     Status: Abnormal (Preliminary result)   Collection Time: 09/16/22  6:42 PM   Specimen: BLOOD  Result Value Ref Range Status   Specimen Description   Final    BLOOD BLOOD RIGHT ARM Performed at [facility], [address] [place], [place] [address]    Special Requests (A)  Final    BACTEROIDES CACCAE Performed at [facility], [address], [place], [place] [address]    Culture   Final    NO GROWTH 4 DAYS Performed  at [facility], [address], [place], [place] [address]    Report Status PENDING  Incomplete  Culture, blood (Routine X 2) w Reflex to ID Panel     Status: None (Preliminary result)   Collection Time: 09/16/22  6:48 PM   Specimen: BLOOD  Result Value Ref Range Status   Specimen Description   Final    BLOOD BLOOD RIGHT ARM Performed at [facility], [address] [place], [place] [address]    Special Requests   Final    Blood Culture adequate volume Performed at [facility], [address] [place], [place] [address]    Culture   Final    NO GROWTH 4 DAYS Performed at [facility], [address] [place], [place] [address]    Report Status PENDING  Incomplete    Time coordinating discharge: 45 minutes  Signed: Greg Cratty  [facility] 09/20/2022, 1:34 PM

## 2022-09-20 NOTE — Progress Notes (Signed)
Mobility Specialist - Progress Note   09/20/22 1021  Mobility  Activity Ambulated with assistance in hallway  Level of Assistance Modified independent, requires aide device or extra time  Assistive Device Crutches  Distance Ambulated (ft) 700 ft  LLE Weight Bearing PWB  Activity Response Tolerated well  Mobility Referral Yes  $Mobility charge 1 Mobility  Mobility Specialist Start Time (ACUTE ONLY) 1006  Mobility Specialist Stop Time (ACUTE ONLY) 1020  Mobility Specialist Time Calculation (min) (ACUTE ONLY) 14 min   Pt received in bed and agreeable to mobility. No complaints during session. Pt to recliner after session with all needs met.    [facility]

## 2022-09-21 ENCOUNTER — Telehealth: Payer: Self-pay

## 2022-09-21 ENCOUNTER — Telehealth: Payer: Self-pay | Admitting: *Deleted

## 2022-09-21 ENCOUNTER — Encounter: Payer: Self-pay | Admitting: *Deleted

## 2022-09-21 LAB — CULTURE, BLOOD (ROUTINE X 2)
Culture: NO GROWTH
Culture: NO GROWTH
Special Requests: ADEQUATE
Special Requests: ADEQUATE

## 2022-09-21 NOTE — Transitions of Care (Post Inpatient/ED Visit) (Signed)
09/21/2022  Name: Justin Suarez MRN: [id number] DOB: 01-Jun-1955  Today's TOC FU Call Status: Today's TOC FU Call Status:: Successful TOC FU Call Completed TOC FU Call Complete Date: 09/21/22  Transition Care Management Follow-up Telephone Call Date of Discharge: 09/20/22 Discharge Facility: [facility]) Type of Discharge: Inpatient Admission Primary Inpatient Discharge Diagnosis:: acute on chronic pancreatitis; HTN How have you been since you were released from the hospital?: Better ("I am doing okay, feeling fine.  I don't need to talk to the SW, I think I can handle stopping drinking, I don't drink that much.  Justin Suarez should still be coming out to see me, but if I don't hear from them by tomorrow, I will call them.") Any questions or concerns?: No  Items Reviewed: Did you receive and understand the discharge instructions provided?: Yes (thoroughly reviewed with patient who verbalizes good understanding of same) Medications obtained,verified, and reconciled?: Yes (Medications Reviewed) (Full medication reconciliation/ review completed; no concerns or discrepancies identified; confirmed patient obtained/ is taking all newly Rx'd medications as instructed; self-manages medications and denies questions/ concerns around medications today)  Confirmed has stopped taking Xarelto as instructed post-most recent hospital discharge due to pseudoaneurysm; however-- patient tells me today that he has started taking ASA 81 mg QD as instructed by orthopedic surgeon at time of prior hospitalization in late July-- reports he has orthopedic post-op appointment on 09/27/22: will make PCP aware of this and encouraged patient to also discuss with orthopedic surgeon at time of follow up office visit  Any new allergies since your discharge?: No Dietary orders reviewed?: Yes Type of Diet Ordered:: Regular- low salt Do you have support at home?: Yes People in Home: significant other Name of Support/Comfort  Primary Source: Reports independent in self-care activities; supportive girlfriend Justin Suarez assists as/ if needed/ indicated and is currently temporarily residing with patient after 2 recent hospitalizations  Medications Reviewed Today: Medications Reviewed Today     Reviewed by Justin Corner, RN (Registered Nurse) on 09/21/22 at 1019  Med List Status: <None>   Medication Order Taking? Sig Documenting Provider Last Dose Status Informant  aspirin EC 81 MG tablet [id number] Yes Take 81 mg by mouth daily. Swallow whole. Justin Ohara, MD Taking Active Self           Med Note Justin Suarez Sep 21, 2022 10:03 AM) 09/21/22: reports during [facility] call he was told by orthopedic surgeon to start taking 2 weeks after surgery   methocarbamol (ROBAXIN) 500 MG tablet [id number] Yes Take 1 tablet (500 mg total) by mouth every 6 (six) hours as needed for muscle spasms. Justin Anger, PA-C Taking Active Self, Pharmacy Records  metoprolol tartrate (LOPRESSOR) 25 MG tablet [id number] Yes Take 0.5 tablets (12.5 mg total) by mouth 2 (two) times daily. Justin Mura, MD Taking Active Self, Pharmacy Records  oxyCODONE (OXY IR/ROXICODONE) 5 MG immediate release tablet [id number] Yes Take 1 tablet (5 mg total) by mouth every 4 (four) hours as needed for severe pain. Justin Anger, PA-C Taking Active Self, Pharmacy Records           Med Note Justin Suarez Sep 21, 2022 10:01 AM) 09/21/22: reports during [facility] call has been using at night only   polyethylene glycol (MIRALAX / GLYCOLAX) 17 g packet [id number] Yes Take 17 g by mouth 2 (two) times daily. Justin Anger, PA-C Taking Active Self, Pharmacy Records  Med Note Justin Suarez   Thu Sep 21, 2022 10:01 AM) 09/21/22: reports during [facility] call has not needed  senna-docusate (SENOKOT-S) 8.6-50 MG tablet [id number] Yes Take 2 tablets by mouth at bedtime as needed for up to 14 days for mild constipation. Justin Anger, PA-C Taking Active Self,  Pharmacy Records           Med Note Justin Suarez Sep 21, 2022 10:01 AM) 09/21/22: reports during [facility] call has not needed            Home Care and Equipment/Supplies: Were Home Health Services Ordered?: NA Name of Home Health Agency:: Justin Suarez was active at time of prior hospitalization for hip surgery- he reports he was told they would remain active after most recent hospital discharge on 09/20/22; confirmed he has phone number for agency- advised him to call them tomorrow if he does not hear from them later today: [phone] Has Agency set up a time to come to your home?: No First Home Health Visit Date:  (reports he had prior visit after hip surgery in late July 2024 and is currently active- waiting to hear from them after most recent discharge 09/20/22) EMR reviewed for Home Health Orders: Orders present/patient has not received call (refer to CM for follow-up) Any new equipment or medical supplies ordered?: No  Functional Questionnaire: Do you need assistance with bathing/showering or dressing?: No (girlfriend assisting as needed/ indicated) Do you need assistance with meal preparation?: No Do you need assistance with eating?: No Do you have difficulty maintaining continence: No Do you need assistance with getting out of bed/getting out of a chair/moving?: No Do you have difficulty managing or taking your medications?: No  Follow up appointments reviewed: PCP Follow-up appointment confirmed?: No (care coordination outreach with [facility] clinical pool to outreach to schedule hospital follow up PCP appointment-- [facility] schedules their own HFU PCP appointments) MD Provider Line Number:[phone] Given: No (verified well-established with current PCP) Specialist Hospital Follow-up appointment confirmed?: Yes Date of Specialist follow-up appointment?: 09/27/22 Follow-Up Specialty Provider:: orthopedic surgeon- [facility]- Dr. Charlann Boxer Do you need transportation to your follow-up  appointment?: No Do you understand care options if your condition(s) worsen?: Yes-patient verbalized understanding  SDOH Interventions Today    Flowsheet Row Most Recent Value  SDOH Interventions   Food Insecurity Interventions Intervention Not Indicated  Transportation Interventions Intervention Not Indicated  [girlfriend Justin Suarez provides transportation]      TOC Interventions Today    Flowsheet Row Most Recent Value  TOC Interventions   TOC Interventions Discussed/Reviewed TOC Interventions Discussed, Contacted provider for patient needs  [provided my direct contact information should questions/ concerns/ needs arise post-TOC call, prior to RN CM telephone visit 10/05/22]      Interventions Today    Flowsheet Row Most Recent Value  Chronic Disease   Chronic disease during today's visit Other  [acute on chronic pancreatitis,  recent prior hip surgery]  General Interventions   General Interventions Discussed/Reviewed General Interventions Discussed, Durable Medical Equipment (DME), Doctor Visits, Referral to Nurse, Communication with, Labs  Labs --  [place] of having labs for liver function checked promptly after recent hospital discharge/ need to see PCP for HFU OV]  Doctor Visits Discussed/Reviewed Doctor Visits Discussed, PCP, Specialist  Durable Medical Equipment (DME) Other  [confirmed currently requiring/ using assistive devices - currently using crutches per patient preference]  PCP/Specialist Visits Compliance with follow-up visit  Communication with PCP/Specialists, RN  Exercise Interventions   Exercise Discussed/Reviewed  Exercise Discussed  [home health PT,  activity restrictions reinforced after recent hip surgery]  Education Interventions   Education Provided Provided Education  Provided Verbal Education On Other, When to see the doctor, [facility], Labs  [need to schedule HFU OV with PCP promptly,  ETOH cessation resources- patient declined,  value/  importance of updating DPR on file to include girlfriend- if patient desires]  Labs Reviewed --  [need for prompt re-check of LFT's]  Mental Health Interventions   Mental Health Discussed/Reviewed Other, Substance Abuse  [offered patient referral to LCSW for ETOH cessation resources- he declined]  Nutrition Interventions   Nutrition Discussed/Reviewed Nutrition Discussed, Decreasing salt  Pharmacy Interventions   Pharmacy Dicussed/Reviewed Pharmacy Topics Discussed  [Full medication review with updating medication list in EHR per patient report]  Safety Interventions   Safety Discussed/Reviewed Safety Discussed, Fall Risk      Caryl Pina, RN, BSN, CCRN Alumnus RN CM Care Coordination/ Transition of Care- [facility] Care Management [phone]: direct office

## 2022-09-21 NOTE — Telephone Encounter (Signed)
-----   Message from Mike Gip sent at 09/20/2022 12:01 PM EDT ----- Regarding: office follow up Bonita Quin, this is a patient of Dr. Marina Goodell with history of chronic pancreatitis-  Please get him an outpatient appointment with Dr. Marina Goodell first available-if you want to include on this note I can add to his chart thank you

## 2022-09-21 NOTE — Telephone Encounter (Signed)
Pt scheduled to see Dr. Marina Goodell 10/19/22 at 9am. Appt letter mailed to pt.

## 2022-09-22 NOTE — Telephone Encounter (Signed)
Patient is schedule for 11:40 AM on Wednesday 09/27/22

## 2022-09-25 ENCOUNTER — Other Ambulatory Visit: Payer: Self-pay

## 2022-09-27 ENCOUNTER — Inpatient Hospital Stay: Payer: 59 | Admitting: Family Medicine

## 2022-09-27 DIAGNOSIS — Z4789 Encounter for other orthopedic aftercare: Secondary | ICD-10-CM | POA: Diagnosis not present

## 2022-09-28 ENCOUNTER — Telehealth: Payer: Self-pay

## 2022-09-28 ENCOUNTER — Other Ambulatory Visit: Payer: Self-pay

## 2022-09-28 DIAGNOSIS — S72142D Displaced intertrochanteric fracture of left femur, subsequent encounter for closed fracture with routine healing: Secondary | ICD-10-CM | POA: Diagnosis not present

## 2022-09-28 DIAGNOSIS — Z86711 Personal history of pulmonary embolism: Secondary | ICD-10-CM | POA: Diagnosis not present

## 2022-09-28 DIAGNOSIS — F17210 Nicotine dependence, cigarettes, uncomplicated: Secondary | ICD-10-CM | POA: Diagnosis not present

## 2022-09-28 DIAGNOSIS — E782 Mixed hyperlipidemia: Secondary | ICD-10-CM | POA: Diagnosis not present

## 2022-09-28 DIAGNOSIS — K219 Gastro-esophageal reflux disease without esophagitis: Secondary | ICD-10-CM | POA: Diagnosis not present

## 2022-09-28 DIAGNOSIS — I700 Atherosclerosis of aorta: Secondary | ICD-10-CM | POA: Diagnosis not present

## 2022-09-28 DIAGNOSIS — M19042 Primary osteoarthritis, left hand: Secondary | ICD-10-CM | POA: Diagnosis not present

## 2022-09-28 DIAGNOSIS — Z9181 History of falling: Secondary | ICD-10-CM | POA: Diagnosis not present

## 2022-09-28 DIAGNOSIS — K760 Fatty (change of) liver, not elsewhere classified: Secondary | ICD-10-CM | POA: Diagnosis not present

## 2022-09-28 DIAGNOSIS — R918 Other nonspecific abnormal finding of lung field: Secondary | ICD-10-CM | POA: Diagnosis not present

## 2022-09-28 DIAGNOSIS — I119 Hypertensive heart disease without heart failure: Secondary | ICD-10-CM | POA: Diagnosis not present

## 2022-09-28 DIAGNOSIS — Z89022 Acquired absence of left finger(s): Secondary | ICD-10-CM | POA: Diagnosis not present

## 2022-09-28 DIAGNOSIS — M1612 Unilateral primary osteoarthritis, left hip: Secondary | ICD-10-CM | POA: Diagnosis not present

## 2022-09-28 DIAGNOSIS — Z86718 Personal history of other venous thrombosis and embolism: Secondary | ICD-10-CM | POA: Diagnosis not present

## 2022-09-28 DIAGNOSIS — M19041 Primary osteoarthritis, right hand: Secondary | ICD-10-CM | POA: Diagnosis not present

## 2022-09-28 DIAGNOSIS — K449 Diaphragmatic hernia without obstruction or gangrene: Secondary | ICD-10-CM | POA: Diagnosis not present

## 2022-09-28 DIAGNOSIS — J449 Chronic obstructive pulmonary disease, unspecified: Secondary | ICD-10-CM | POA: Diagnosis not present

## 2022-09-28 DIAGNOSIS — I081 Rheumatic disorders of both mitral and tricuspid valves: Secondary | ICD-10-CM | POA: Diagnosis not present

## 2022-09-28 NOTE — Telephone Encounter (Signed)
Andi called requesting POC approval on [place].

## 2022-09-29 ENCOUNTER — Ambulatory Visit (INDEPENDENT_AMBULATORY_CARE_PROVIDER_SITE_OTHER): Payer: 59

## 2022-09-29 VITALS — BP 120/70 | HR 64 | Temp 96.7°F | Resp 14 | Ht 71.0 in | Wt 154.0 lb

## 2022-09-29 DIAGNOSIS — S72142A Displaced intertrochanteric fracture of left femur, initial encounter for closed fracture: Secondary | ICD-10-CM | POA: Diagnosis not present

## 2022-09-29 DIAGNOSIS — R109 Unspecified abdominal pain: Secondary | ICD-10-CM | POA: Diagnosis not present

## 2022-09-29 DIAGNOSIS — G8929 Other chronic pain: Secondary | ICD-10-CM | POA: Diagnosis not present

## 2022-09-29 DIAGNOSIS — F17200 Nicotine dependence, unspecified, uncomplicated: Secondary | ICD-10-CM | POA: Diagnosis not present

## 2022-09-29 DIAGNOSIS — K861 Other chronic pancreatitis: Secondary | ICD-10-CM

## 2022-09-29 MED ORDER — OXYCODONE HCL 5 MG PO TABS: 5.0000 mg | ORAL_TABLET | Freq: Two times a day (BID) | ORAL | 0 refills | Status: AC | PRN

## 2022-09-29 NOTE — Progress Notes (Signed)
Subjective:  Patient ID: Justin Suarez, male    DOB: [birth year]  Age: 66 y.o. MRN: [id number]  Chief Complaint  Patient presents with   Transitions Of Care   Pancreatitis    HPI   Patient presents today for hospital follow up:  patient was afebrile, initial blood pressure was elevated to 222/97. Initial labs showed WC count of 11.3, hemoglobin 12.2, renal function normal, lipase elevated to 556, AST/ALT/alk phos elevated to 63/96/294. CT abdomen pelvis showed a masslike enlargement of the pancreatic head similar to prior CT and MRI from 2022-findings likely representing acute pancreatitis but unable to exclude a mass.  Calcifications in the pancreas compatible with chronic pancreatitis.  Stable intrahepatic extremity biliary duct dilatation likely due to postcholecystectomy status   Patient required multiple rounds of IV opiates Started on IV fluids, IV analgesics, IV antiemetics Admitted to [facility] Seen at [facility] ED admitted 09/14/22 and discharged 09/20/23   Reports pain in his abdomen for a year now. Episodic- related to greasy or salty food intake. Has been hospitalized for the abdominal pain twice so far.  He had EMBOLIZATION done last week. Has felt the best in a long time Denies any pain.  Was sent with 21 tabs of Oxycodone 5 mg tabs on 09/25/22. Has 4-5 left. Took 2 last night, 3 the day before.        09/29/2022   10:46 AM 01/31/2022    1:39 PM 09/20/2021    1:49 PM  Depression screen PHQ 2/9  Decreased Interest 0 0 0  Down, Depressed, Hopeless 0 0 0  PHQ - 2 Score 0 0 0  Altered sleeping 0    Tired, decreased energy 0    Change in appetite 0    Feeling bad or failure about yourself  0    Trouble concentrating 0    Moving slowly or fidgety/restless 0    Suicidal thoughts 0    PHQ-9 Score 0    Difficult doing work/chores Somewhat difficult          01/31/2022    1:39 PM  Fall Risk   Falls in the past year? 0  Number falls in past yr: 0  Injury with Fall? 0   Follow up Falls evaluation completed    Patient Care Team: [name], MD as PCP - General (Internal Medicine) Dyann Kief, PA-C as Physician Assistant (Cardiology)   Review of Systems  Constitutional:  Negative for chills, fatigue, fever and unexpected weight change.  HENT:  Negative for congestion, ear pain, sinus pain and sore throat.   Cardiovascular:  Negative for chest pain and palpitations.  Gastrointestinal:  Positive for abdominal pain. Negative for blood in stool, constipation, diarrhea, nausea and vomiting.  Endocrine: Negative for polydipsia.  Genitourinary:  Negative for dysuria.  Musculoskeletal:  Negative for back pain.  Skin:  Negative for rash.  Neurological:  Negative for headaches.    Current Outpatient Medications on File Prior to Visit  Medication Sig Dispense Refill   aspirin EC 81 MG tablet Take 81 mg by mouth daily. Swallow whole.     methocarbamol (ROBAXIN) 500 MG tablet Take 1 tablet (500 mg total) by mouth every 6 (six) hours as needed for muscle spasms. 40 tablet 2   metoprolol tartrate (LOPRESSOR) 25 MG tablet Take 0.5 tablets (12.5 mg total) by mouth 2 (two) times daily. 60 tablet 2   polyethylene glycol (MIRALAX / GLYCOLAX) 17 g packet Take 17 g by mouth 2 (two) times daily.  14 each 0   No current facility-administered medications on file prior to visit.   Past Medical History:  Diagnosis Date   Abnormal CT scan, chest 06/2011   multiple lung nodules; needs repeat study in 3 to 12 months.   Arthritis    Hands   Chronic anticoagulation    Common bile duct dilation    COPD (chronic obstructive pulmonary disease) (HCC)    Pt denies   DVT (deep venous thrombosis) (HCC)    Gallstone pancreatitis    GERD (gastroesophageal reflux disease)    Hepatitis C    History of hiatal hernia    HTN (hypertension)    Noncompliance    Pancreatitis    Pancreatitis 06/2020   Pulmonary embolus (HCC)    Tick bite 07/2011   Tobacco abuse    Past  Surgical History:  Procedure Laterality Date   ANKLE SURGERY Right 1998   3 surgeries.   CHOLECYSTECTOMY N/A 05/24/2020   Procedure: LAPAROSCOPIC CHOLECYSTECTOMY WITH INTRAOPERATIVE CHOLANGIOGRAM;  Surgeon: Gaynelle Adu, MD;  Location: [facility];  Service: General;  Laterality: N/A;   ESOPHAGOGASTRODUODENOSCOPY (EGD) WITH PROPOFOL N/A 04/01/2020   Procedure: ESOPHAGOGASTRODUODENOSCOPY (EGD) WITH PROPOFOL;  Surgeon: Rachael Fee, MD;  Location: [facility];  Service: Endoscopy;  Laterality: N/A;   EUS N/A 04/01/2020   Procedure: UPPER ENDOSCOPIC ULTRASOUND (EUS) RADIAL;  Surgeon: Rachael Fee, MD;  Location: [facility];  Service: Endoscopy;  Laterality: N/A;   FINGER AMPUTATION Left 1970   first part of middle finger removed   FOOT SURGERY Right    x 3 from a fall   INGUINAL HERNIA REPAIR Right    years ago   INTRAMEDULLARY (IM) NAIL INTERTROCHANTERIC Left 09/07/2022   Procedure: INTRAMEDULLARY (IM) NAIL INTERTROCHANTERIC;  Surgeon: Durene Romans, MD;  Location: [facility];  Service: Orthopedics;  Laterality: Left;   IR ANGIOGRAM PELVIS SELECTIVE OR SUPRASELECTIVE  09/18/2022   IR ANGIOGRAM SELECTIVE EACH ADDITIONAL VESSEL  09/18/2022   IR ANGIOGRAM SELECTIVE EACH ADDITIONAL VESSEL  09/18/2022   IR ANGIOGRAM SELECTIVE EACH ADDITIONAL VESSEL  09/18/2022   IR ANGIOGRAM SELECTIVE EACH ADDITIONAL VESSEL  09/18/2022   IR ANGIOGRAM VISCERAL SELECTIVE  09/18/2022   IR ANGIOGRAM VISCERAL SELECTIVE  09/18/2022   IR EMBO ART  VEN HEMORR LYMPH EXTRAV  INC GUIDE ROADMAPPING  09/18/2022   IR US GUIDE VASC ACCESS RIGHT  09/18/2022    Family History  Problem Relation Age of Onset   Kidney disease Mother        ESRD dialysis   Diabetes Mother    Heart disease Mother        CHF   Heart failure Mother    Heart disease Father    Heart disease Sister 66       CABG   Coronary artery disease Sister    Coronary artery disease Sister    Heart disease Sister    Cerebral aneurysm Sister    Atrial  fibrillation Brother    Hypertension Brother    Esophageal cancer Neg Hx    Colon cancer Neg Hx    Rectal cancer Neg Hx    Stomach cancer Neg Hx    Social History   Socioeconomic History   Marital status: Divorced    Spouse name: Not on file   Number of children: 0   Years of education: Not on file   Highest education level: Not on file  Occupational History   Occupation: retired    Comment: Long distance truck Hospital doctor  Tobacco Use   Smoking status: Every Day    Current packs/day: 0.25    Average packs/day: 0.3 packs/day for 43.0 years (10.8 ttl pk-yrs)    Types: Cigarettes   Smokeless tobacco: Never   Tobacco comments:    1 ppd for 40 years.  Vaping Use   Vaping status: Never Used  Substance and Sexual Activity   Alcohol use: Yes    Comment: Occasional. 4-5 beers 12 oz./week.    Drug use: No   Sexual activity: Not Currently  Other Topics Concern   Not on file  Social History Narrative   Retired Naval architect    Social Determinants of Corporate investment banker Strain: Not on file  Food Insecurity: No Food Insecurity (09/21/2022)   Hunger Vital Sign    Worried About Running Out of Food in the Last Year: Never true    Ran Out of Food in the Last Year: Never true  Transportation Needs: No Transportation Needs (09/21/2022)   PRAPARE - Administrator, Civil Service (Medical): No    Lack of Transportation (Non-Medical): No  Physical Activity: Not on file  Stress: Not on file  Social Connections: Not on file    Objective:  BP 120/70   Pulse 64   Temp (!) 96.7 F (35.9 C)   Resp 14   Ht 5\' 11"  (1.803 m)   Wt 154 lb (69.9 kg)   SpO2 99%   BMI 21.48 kg/m      09/29/2022   10:36 AM 09/20/2022    5:13 AM 09/19/2022    8:36 PM  BP/Weight  Systolic BP 120 125 125  Diastolic BP 70 62 64  Wt. (Lbs) 154    BMI 21.48 kg/m2      Physical Exam Constitutional:      Appearance: Normal appearance.  HENT:     Head: Normocephalic and atraumatic.      Mouth/Throat:     Mouth: Mucous membranes are moist.     Pharynx: Oropharynx is clear.  Eyes:     Extraocular Movements: Extraocular movements intact.     Pupils: Pupils are equal, round, and reactive to light.  Cardiovascular:     Rate and Rhythm: Normal rate and regular rhythm.  Pulmonary:     Effort: Pulmonary effort is normal.     Breath sounds: Normal breath sounds.  Abdominal:     Comments: Minimal tenderness in her right upper quadrant No guarding or rigidity  Musculoskeletal:        General: Normal range of motion.     Comments: Left hip surgery incision healed well  Skin:    General: Skin is warm and dry.  Neurological:     General: No focal deficit present.     Mental Status: He is alert and oriented to person, place, and time. Mental status is at baseline.  Psychiatric:        Mood and Affect: Mood normal.        Behavior: Behavior normal.     Diabetic Foot Exam - Simple   No data filed      Lab Results  Component Value Date   WBC 5.5 09/20/2022   HGB 10.0 (L) 09/20/2022   HCT 31.1 (L) 09/20/2022   PLT 246 09/20/2022   GLUCOSE 155 (H) 09/20/2022   CHOL 141 09/27/2021   TRIG 84 09/27/2021   HDL 56 09/27/2021   LDLCALC 69 09/27/2021   ALT 61 (H) 09/20/2022   AST 44 (H)  09/20/2022   NA 134 (L) 09/20/2022   K 3.9 09/20/2022   CL 101 09/20/2022   CREATININE 0.90 09/20/2022   BUN 7 (L) 09/20/2022   CO2 25 09/20/2022   TSH 1.670 09/27/2021   INR 1.1 09/19/2022      Assessment & Plan:    Chronic pancreatitis, unspecified pancreatitis type [facility]) Assessment & Plan: Patient here for a follow up of chronic abdominal pain, chronic pancreatitis. 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  States he is doing much better since.  Denies any severe abdominal pain but still taking couple oxycodone daily.  PLAN: continue follow up with GI - recommend to cut back on oxycodone use - recommend to cut back on alcohol and quit  completely - refill on oxycodone for 30 tabs, for 15 days sent, PDMP reviewed. - he was noted to have anemia and elevated LFTS on his last blood work, will repeat both today   Orders: -     CBC with Differential/Platelet -     Comprehensive metabolic panel  Chronic abdominal pain Assessment & Plan: Due to above mentioned chronic pancreatitis and pseudoaneurysm Plan as above     Tobacco use disorder Assessment & Plan: 4 minute discussion regarding the harms of tobacco use, the benefits of cessation, and methods of cessation  Patient is currently precontemplative. Does not want to quit States he has been smoking about half a pack a day since age 89  PLAN: ordered low dose lung cancer CT scan for screening. Will reassess at next visit regarding smoking cessation.   Orders: -     CT CHEST LUNG CANCER SCREENING LOW DOSE WO CONTRAST; Future  Intertrochanteric fracture of left femur, closed, initial encounter [facility]) Assessment & Plan: Healed well.  Normal weight bearing noted    Other orders -     oxyCODONE HCl; Take 1 tablet (5 mg total) by mouth 2 (two) times daily as needed for up to 15 days for severe pain.  Dispense: 30 tablet; Refill: 0     Meds ordered this encounter  Medications   oxyCODONE (OXY IR/ROXICODONE) 5 MG immediate release tablet    Sig: Take 1 tablet (5 mg total) by mouth 2 (two) times daily as needed for up to 15 days for severe pain.    Dispense:  30 tablet    Refill:  0    Orders Placed This Encounter  Procedures   CT CHEST LUNG CA SCREEN LOW DOSE W/O CM   CBC with Differential/Platelet   Comprehensive metabolic panel     Follow-up: Return in about 2 months (around 11/29/2022) for chronic disease follow up.   I,Katherina A Bramblett,acting as a scribe for [employer], MD.,have documented all relevant documentation on the behalf of Windell Moment, MD,as directed by  Windell Moment, MD while in the presence of Windell Moment, MD.   An  After Visit Summary was printed and given to the patient.  Windell Moment, MD [facility] [phone]

## 2022-09-29 NOTE — Assessment & Plan Note (Addendum)
Patient here for a follow up of chronic abdominal pain, chronic pancreatitis. 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  States he is doing much better since.  Denies any severe abdominal pain but still taking couple oxycodone daily.  PLAN: continue follow up with GI - recommend to cut back on oxycodone use - recommend to cut back on alcohol and quit completely - refill on oxycodone for 30 tabs, for 15 days sent, PDMP reviewed. - he was noted to have anemia and elevated LFTS on his last blood work, will repeat both today

## 2022-09-29 NOTE — Assessment & Plan Note (Signed)
Healed well.  Normal weight bearing noted

## 2022-09-29 NOTE — Assessment & Plan Note (Signed)
Due to above mentioned chronic pancreatitis and pseudoaneurysm Plan as above

## 2022-09-29 NOTE — Assessment & Plan Note (Signed)
4 minute discussion regarding the harms of tobacco use, the benefits of cessation, and methods of cessation  Patient is currently precontemplative. Does not want to quit States he has been smoking about half a pack a day since age 67  PLAN: ordered low dose lung cancer CT scan for screening. Will reassess at next visit regarding smoking cessation.

## 2022-09-29 NOTE — Patient Instructions (Addendum)
Will refill your oxycodone, to only take sparingly for severe pain.  You may be able to get the refill on Monday Will order CT chest to screen for lung cancer Please try your best to cut back on the number of cigarettes and beers Blood work today Return in 2 months.

## 2022-10-02 ENCOUNTER — Other Ambulatory Visit: Payer: Self-pay

## 2022-10-02 ENCOUNTER — Telehealth: Payer: Self-pay

## 2022-10-02 DIAGNOSIS — R748 Abnormal levels of other serum enzymes: Secondary | ICD-10-CM

## 2022-10-02 DIAGNOSIS — R7989 Other specified abnormal findings of blood chemistry: Secondary | ICD-10-CM

## 2022-10-02 NOTE — Telephone Encounter (Signed)
[facility] called with Lab alert stating that patient's AST was 295. Patient has already been contacted and patient is feeling good no signs of feeling bad. Per provider patient is coming in to have repeat labs this week to recheck CMP.

## 2022-10-04 ENCOUNTER — Other Ambulatory Visit: Payer: 59

## 2022-10-04 DIAGNOSIS — R748 Abnormal levels of other serum enzymes: Secondary | ICD-10-CM | POA: Diagnosis not present

## 2022-10-04 DIAGNOSIS — R7989 Other specified abnormal findings of blood chemistry: Secondary | ICD-10-CM

## 2022-10-04 LAB — COMPREHENSIVE METABOLIC PANEL
ALT: 236 IU/L (ref 0–44)
AST: 372 IU/L (ref 0–40)
Albumin: 4 g/dL (ref 3.9–4.9)
Alkaline Phosphatase: 711 IU/L — ABNORMAL HIGH (ref 44–121)
BUN/Creatinine Ratio: 13 (ref 10–24)
BUN: 9 mg/dL (ref 8–27)
Bilirubin Total: 0.5 mg/dL (ref 0.0–1.2)
CO2: 24 mmol/L (ref 20–29)
Calcium: 9.5 mg/dL (ref 8.6–10.2)
Chloride: 100 mmol/L (ref 96–106)
Creatinine, Ser: 0.72 mg/dL — ABNORMAL LOW (ref 0.76–1.27)
Globulin, Total: 2.7 g/dL (ref 1.5–4.5)
Glucose: 107 mg/dL — ABNORMAL HIGH (ref 70–99)
Potassium: 5.3 mmol/L — ABNORMAL HIGH (ref 3.5–5.2)
Sodium: 138 mmol/L (ref 134–144)
Total Protein: 6.7 g/dL (ref 6.0–8.5)
eGFR: 100 mL/min/{1.73_m2} (ref >59)

## 2022-10-04 LAB — CBC WITH DIFFERENTIAL/PLATELET
Basophils Absolute: 0 10*3/uL (ref 0.0–0.2)
Basos: 1 %
EOS (ABSOLUTE): 0.1 10*3/uL (ref 0.0–0.4)
Eos: 3 %
Hematocrit: 37.6 % (ref 37.5–51.0)
Hemoglobin: 12.4 g/dL — ABNORMAL LOW (ref 13.0–17.7)
Immature Grans (Abs): 0 10*3/uL (ref 0.0–0.1)
Immature Granulocytes: 0 %
Lymphocytes Absolute: 1.7 10*3/uL (ref 0.7–3.1)
Lymphs: 33 %
MCH: 31.1 pg (ref 26.6–33.0)
MCHC: 33 g/dL (ref 31.5–35.7)
MCV: 94 fL (ref 79–97)
Monocytes Absolute: 0.5 10*3/uL (ref 0.1–0.9)
Monocytes: 10 %
Neutrophils Absolute: 2.8 10*3/uL (ref 1.4–7.0)
Neutrophils: 53 %
Platelets: 306 10*3/uL (ref 150–450)
RBC: 3.99 x10E6/uL — ABNORMAL LOW (ref 4.14–5.80)
RDW: 13 % (ref 11.6–15.4)
WBC: 5.2 10*3/uL (ref 3.4–10.8)

## 2022-10-05 ENCOUNTER — Ambulatory Visit: Payer: Self-pay

## 2022-10-05 DIAGNOSIS — M1612 Unilateral primary osteoarthritis, left hip: Secondary | ICD-10-CM | POA: Diagnosis not present

## 2022-10-05 DIAGNOSIS — Z86711 Personal history of pulmonary embolism: Secondary | ICD-10-CM | POA: Diagnosis not present

## 2022-10-05 DIAGNOSIS — I119 Hypertensive heart disease without heart failure: Secondary | ICD-10-CM | POA: Diagnosis not present

## 2022-10-05 DIAGNOSIS — E782 Mixed hyperlipidemia: Secondary | ICD-10-CM | POA: Diagnosis not present

## 2022-10-05 DIAGNOSIS — Z89022 Acquired absence of left finger(s): Secondary | ICD-10-CM | POA: Diagnosis not present

## 2022-10-05 DIAGNOSIS — I700 Atherosclerosis of aorta: Secondary | ICD-10-CM | POA: Diagnosis not present

## 2022-10-05 DIAGNOSIS — Z86718 Personal history of other venous thrombosis and embolism: Secondary | ICD-10-CM | POA: Diagnosis not present

## 2022-10-05 DIAGNOSIS — F17210 Nicotine dependence, cigarettes, uncomplicated: Secondary | ICD-10-CM | POA: Diagnosis not present

## 2022-10-05 DIAGNOSIS — K449 Diaphragmatic hernia without obstruction or gangrene: Secondary | ICD-10-CM | POA: Diagnosis not present

## 2022-10-05 DIAGNOSIS — K760 Fatty (change of) liver, not elsewhere classified: Secondary | ICD-10-CM | POA: Diagnosis not present

## 2022-10-05 DIAGNOSIS — R918 Other nonspecific abnormal finding of lung field: Secondary | ICD-10-CM | POA: Diagnosis not present

## 2022-10-05 DIAGNOSIS — S72142D Displaced intertrochanteric fracture of left femur, subsequent encounter for closed fracture with routine healing: Secondary | ICD-10-CM | POA: Diagnosis not present

## 2022-10-05 DIAGNOSIS — I081 Rheumatic disorders of both mitral and tricuspid valves: Secondary | ICD-10-CM | POA: Diagnosis not present

## 2022-10-05 DIAGNOSIS — M19042 Primary osteoarthritis, left hand: Secondary | ICD-10-CM | POA: Diagnosis not present

## 2022-10-05 DIAGNOSIS — K219 Gastro-esophageal reflux disease without esophagitis: Secondary | ICD-10-CM | POA: Diagnosis not present

## 2022-10-05 DIAGNOSIS — J449 Chronic obstructive pulmonary disease, unspecified: Secondary | ICD-10-CM | POA: Diagnosis not present

## 2022-10-05 DIAGNOSIS — M19041 Primary osteoarthritis, right hand: Secondary | ICD-10-CM | POA: Diagnosis not present

## 2022-10-05 DIAGNOSIS — Z9181 History of falling: Secondary | ICD-10-CM | POA: Diagnosis not present

## 2022-10-05 NOTE — Patient Outreach (Signed)
  Care Coordination   Follow Up Visit Note   10/05/2022 Name: SRIJAN BRUNGARDT MRN: [id number] DOB: 20-Mar-1955  JAKOBE MCMANIGLE is a 67 y.o. year old male who sees Cox, Kirsten, MD for primary care. I spoke with  [name] by phone today.  What matters to the patients health and wellness today?  Follow up transition care call.  Reports no pain at this time. Not taking any pain medications. Reports appetite is good.   Denies any bleeding.  Reports no alcohol in 3 days.  Patient denies any problems or concerns.      SDOH assessments and interventions completed:  No     Care Coordination Interventions:  Yes, provided   Interventions Today    Flowsheet Row Most Recent Value  Chronic Disease   Chronic disease during today's visit Other  [abdominal pain]  General Interventions   General Interventions Discussed/Reviewed General Interventions Reviewed, Doctor Visits  Doctor Visits Discussed/Reviewed Doctor Visits Discussed  PCP/Specialist Visits Compliance with follow-up visit  Education Interventions   Education Provided Provided Education  [Reviewed with patient the importance of alcohol cessation and offered support. Reviewed that patient is no longer taking pain medications.  Encouraged patient to talk to MD if he needs our services in the future.]  Provided Verbal Education On Nutrition, When to see the doctor  Mental Health Interventions   Mental Health Discussed/Reviewed Substance Abuse  Nutrition Interventions   Nutrition Discussed/Reviewed Nutrition Discussed  Pharmacy Interventions   Pharmacy Dicussed/Reviewed Medications and their functions  Safety Interventions   Safety Discussed/Reviewed Fall Risk       Follow up plan: No further intervention required.   Encounter Outcome:  Pt. Visit Completed    Rowe Pavy, RN, BSN, CEN [facility] Coordinator [phone]

## 2022-10-09 DIAGNOSIS — K449 Diaphragmatic hernia without obstruction or gangrene: Secondary | ICD-10-CM | POA: Diagnosis not present

## 2022-10-09 DIAGNOSIS — M19042 Primary osteoarthritis, left hand: Secondary | ICD-10-CM | POA: Diagnosis not present

## 2022-10-09 DIAGNOSIS — Z86711 Personal history of pulmonary embolism: Secondary | ICD-10-CM | POA: Diagnosis not present

## 2022-10-09 DIAGNOSIS — F17210 Nicotine dependence, cigarettes, uncomplicated: Secondary | ICD-10-CM | POA: Diagnosis not present

## 2022-10-09 DIAGNOSIS — E782 Mixed hyperlipidemia: Secondary | ICD-10-CM | POA: Diagnosis not present

## 2022-10-09 DIAGNOSIS — I700 Atherosclerosis of aorta: Secondary | ICD-10-CM | POA: Diagnosis not present

## 2022-10-09 DIAGNOSIS — Z86718 Personal history of other venous thrombosis and embolism: Secondary | ICD-10-CM | POA: Diagnosis not present

## 2022-10-09 DIAGNOSIS — M19041 Primary osteoarthritis, right hand: Secondary | ICD-10-CM | POA: Diagnosis not present

## 2022-10-09 DIAGNOSIS — K760 Fatty (change of) liver, not elsewhere classified: Secondary | ICD-10-CM | POA: Diagnosis not present

## 2022-10-09 DIAGNOSIS — K219 Gastro-esophageal reflux disease without esophagitis: Secondary | ICD-10-CM | POA: Diagnosis not present

## 2022-10-09 DIAGNOSIS — I081 Rheumatic disorders of both mitral and tricuspid valves: Secondary | ICD-10-CM | POA: Diagnosis not present

## 2022-10-09 DIAGNOSIS — J449 Chronic obstructive pulmonary disease, unspecified: Secondary | ICD-10-CM | POA: Diagnosis not present

## 2022-10-09 DIAGNOSIS — Z9181 History of falling: Secondary | ICD-10-CM | POA: Diagnosis not present

## 2022-10-09 DIAGNOSIS — S72142D Displaced intertrochanteric fracture of left femur, subsequent encounter for closed fracture with routine healing: Secondary | ICD-10-CM | POA: Diagnosis not present

## 2022-10-09 DIAGNOSIS — I119 Hypertensive heart disease without heart failure: Secondary | ICD-10-CM | POA: Diagnosis not present

## 2022-10-09 DIAGNOSIS — M1612 Unilateral primary osteoarthritis, left hip: Secondary | ICD-10-CM | POA: Diagnosis not present

## 2022-10-09 DIAGNOSIS — R918 Other nonspecific abnormal finding of lung field: Secondary | ICD-10-CM | POA: Diagnosis not present

## 2022-10-09 DIAGNOSIS — Z89022 Acquired absence of left finger(s): Secondary | ICD-10-CM | POA: Diagnosis not present

## 2022-10-10 DIAGNOSIS — B182 Chronic viral hepatitis C: Secondary | ICD-10-CM

## 2022-10-10 DIAGNOSIS — K860 Alcohol-induced chronic pancreatitis: Secondary | ICD-10-CM

## 2022-10-10 DIAGNOSIS — S72142D Displaced intertrochanteric fracture of left femur, subsequent encounter for closed fracture with routine healing: Secondary | ICD-10-CM

## 2022-10-10 DIAGNOSIS — I700 Atherosclerosis of aorta: Secondary | ICD-10-CM

## 2022-10-10 DIAGNOSIS — M19041 Primary osteoarthritis, right hand: Secondary | ICD-10-CM

## 2022-10-10 DIAGNOSIS — K760 Fatty (change of) liver, not elsewhere classified: Secondary | ICD-10-CM

## 2022-10-10 DIAGNOSIS — I081 Rheumatic disorders of both mitral and tricuspid valves: Secondary | ICD-10-CM

## 2022-10-10 DIAGNOSIS — J449 Chronic obstructive pulmonary disease, unspecified: Secondary | ICD-10-CM

## 2022-10-10 DIAGNOSIS — M1612 Unilateral primary osteoarthritis, left hip: Secondary | ICD-10-CM

## 2022-10-10 DIAGNOSIS — I119 Hypertensive heart disease without heart failure: Secondary | ICD-10-CM

## 2022-10-10 DIAGNOSIS — M19042 Primary osteoarthritis, left hand: Secondary | ICD-10-CM

## 2022-10-10 DIAGNOSIS — F1010 Alcohol abuse, uncomplicated: Secondary | ICD-10-CM

## 2022-10-16 DIAGNOSIS — M19042 Primary osteoarthritis, left hand: Secondary | ICD-10-CM | POA: Diagnosis not present

## 2022-10-16 DIAGNOSIS — E782 Mixed hyperlipidemia: Secondary | ICD-10-CM | POA: Diagnosis not present

## 2022-10-16 DIAGNOSIS — Z89022 Acquired absence of left finger(s): Secondary | ICD-10-CM | POA: Diagnosis not present

## 2022-10-16 DIAGNOSIS — Z86711 Personal history of pulmonary embolism: Secondary | ICD-10-CM | POA: Diagnosis not present

## 2022-10-16 DIAGNOSIS — M1612 Unilateral primary osteoarthritis, left hip: Secondary | ICD-10-CM | POA: Diagnosis not present

## 2022-10-16 DIAGNOSIS — S72142D Displaced intertrochanteric fracture of left femur, subsequent encounter for closed fracture with routine healing: Secondary | ICD-10-CM | POA: Diagnosis not present

## 2022-10-16 DIAGNOSIS — K449 Diaphragmatic hernia without obstruction or gangrene: Secondary | ICD-10-CM | POA: Diagnosis not present

## 2022-10-16 DIAGNOSIS — R918 Other nonspecific abnormal finding of lung field: Secondary | ICD-10-CM | POA: Diagnosis not present

## 2022-10-16 DIAGNOSIS — K219 Gastro-esophageal reflux disease without esophagitis: Secondary | ICD-10-CM | POA: Diagnosis not present

## 2022-10-16 DIAGNOSIS — Z9181 History of falling: Secondary | ICD-10-CM | POA: Diagnosis not present

## 2022-10-16 DIAGNOSIS — M19041 Primary osteoarthritis, right hand: Secondary | ICD-10-CM | POA: Diagnosis not present

## 2022-10-16 DIAGNOSIS — Z86718 Personal history of other venous thrombosis and embolism: Secondary | ICD-10-CM | POA: Diagnosis not present

## 2022-10-16 DIAGNOSIS — K760 Fatty (change of) liver, not elsewhere classified: Secondary | ICD-10-CM | POA: Diagnosis not present

## 2022-10-16 DIAGNOSIS — I081 Rheumatic disorders of both mitral and tricuspid valves: Secondary | ICD-10-CM | POA: Diagnosis not present

## 2022-10-16 DIAGNOSIS — F17210 Nicotine dependence, cigarettes, uncomplicated: Secondary | ICD-10-CM | POA: Diagnosis not present

## 2022-10-16 DIAGNOSIS — I700 Atherosclerosis of aorta: Secondary | ICD-10-CM | POA: Diagnosis not present

## 2022-10-16 DIAGNOSIS — I119 Hypertensive heart disease without heart failure: Secondary | ICD-10-CM | POA: Diagnosis not present

## 2022-10-16 DIAGNOSIS — J449 Chronic obstructive pulmonary disease, unspecified: Secondary | ICD-10-CM | POA: Diagnosis not present

## 2022-10-19 ENCOUNTER — Ambulatory Visit: Payer: 59 | Admitting: Internal Medicine

## 2022-11-06 ENCOUNTER — Telehealth (HOSPITAL_BASED_OUTPATIENT_CLINIC_OR_DEPARTMENT_OTHER): Payer: Self-pay

## 2022-11-13 ENCOUNTER — Telehealth: Payer: Self-pay

## 2022-11-13 NOTE — Telephone Encounter (Signed)
I was unable to leave a message on the patients phone. The provider is out of the office for 11/30/2022. Appointment has been canceled.  I have mailed the patient a letter requesting him to call the office to get this appointment rescheduled.

## 2022-11-30 ENCOUNTER — Ambulatory Visit: Payer: 59

## 2022-12-08 ENCOUNTER — Telehealth (HOSPITAL_BASED_OUTPATIENT_CLINIC_OR_DEPARTMENT_OTHER): Payer: Self-pay

## 2023-04-03 ENCOUNTER — Other Ambulatory Visit: Payer: Self-pay | Admitting: Internal Medicine

## 2023-04-03 DIAGNOSIS — K861 Other chronic pancreatitis: Secondary | ICD-10-CM

## 2023-04-03 DIAGNOSIS — K862 Cyst of pancreas: Secondary | ICD-10-CM

## 2023-08-09 ENCOUNTER — Telehealth: Payer: Self-pay

## 2023-08-09 NOTE — Telephone Encounter (Signed)
 Attempted to reach patient to schedule chronic and AWV appointments - patient did not answer nor does he have VM available.

## 2024-05-20 ENCOUNTER — Encounter: Admitting: Family Medicine
